# Patient Record
Sex: Female | Born: 1985 | Race: Black or African American | Hispanic: No | Marital: Single | Smoking: Current every day smoker
Health system: Southern US, Community
[De-identification: ages and names within clinical notes are randomized; demographics above are authoritative.]

## PROBLEM LIST (undated history)

## (undated) ENCOUNTER — Inpatient Hospital Stay (HOSPITAL_COMMUNITY): Payer: Self-pay

## (undated) DIAGNOSIS — N83209 Unspecified ovarian cyst, unspecified side: Secondary | ICD-10-CM

## (undated) DIAGNOSIS — B999 Unspecified infectious disease: Secondary | ICD-10-CM

## (undated) DIAGNOSIS — A549 Gonococcal infection, unspecified: Secondary | ICD-10-CM

## (undated) DIAGNOSIS — O1490 Unspecified pre-eclampsia, unspecified trimester: Secondary | ICD-10-CM

## (undated) DIAGNOSIS — A539 Syphilis, unspecified: Secondary | ICD-10-CM

## (undated) DIAGNOSIS — A749 Chlamydial infection, unspecified: Secondary | ICD-10-CM

## (undated) HISTORY — PX: WISDOM TOOTH EXTRACTION: SHX21

---
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
## 2007-04-06 ENCOUNTER — Ambulatory Visit: Payer: Self-pay | Admitting: Gynecology

## 2007-10-12 ENCOUNTER — Ambulatory Visit: Payer: Self-pay | Admitting: *Deleted

## 2007-10-12 ENCOUNTER — Encounter (INDEPENDENT_AMBULATORY_CARE_PROVIDER_SITE_OTHER): Payer: Self-pay | Admitting: *Deleted

## 2010-03-05 ENCOUNTER — Ambulatory Visit: Payer: Self-pay | Admitting: Obstetrics and Gynecology

## 2010-03-05 LAB — CONVERTED CEMR LAB
Platelets: 313 10*3/uL (ref 150–400)
WBC: 12.4 10*3/uL — ABNORMAL HIGH (ref 4.0–10.5)

## 2010-03-08 ENCOUNTER — Ambulatory Visit: Payer: Self-pay | Admitting: Obstetrics and Gynecology

## 2010-03-08 ENCOUNTER — Inpatient Hospital Stay (HOSPITAL_COMMUNITY): Admission: AD | Admit: 2010-03-08 | Discharge: 2010-03-09 | Payer: Self-pay | Admitting: Obstetrics and Gynecology

## 2010-03-09 ENCOUNTER — Inpatient Hospital Stay (HOSPITAL_COMMUNITY): Admission: AD | Admit: 2010-03-09 | Discharge: 2010-03-12 | Payer: Self-pay | Admitting: Obstetrics and Gynecology

## 2010-03-09 ENCOUNTER — Ambulatory Visit: Payer: Self-pay | Admitting: Obstetrics and Gynecology

## 2011-01-12 LAB — CBC
HCT: 39.2 % (ref 36.0–46.0)
Hemoglobin: 13.5 g/dL (ref 12.0–15.0)
Platelets: 289 10*3/uL (ref 150–400)
RBC: 4.28 MIL/uL (ref 3.87–5.11)
WBC: 14.6 10*3/uL — ABNORMAL HIGH (ref 4.0–10.5)

## 2011-01-13 LAB — POCT URINALYSIS DIP (DEVICE)
Ketones, ur: NEGATIVE mg/dL
Specific Gravity, Urine: >=1.03 (ref 1.005–1.030)
pH: 5.5 (ref 5.0–8.0)

## 2011-03-10 NOTE — Group Therapy Note (Signed)
NAME:  Carla Carson, Carla Carson NO.:  [id number]   MEDICAL RECORD NO.:  [id number]          PATIENT TYPE:  WOC   LOCATION:  [facility]                   FACILITY:  [facility]   PHYSICIAN:  Karlton Lemon, MD      DATE OF BIRTH:  [birth year]   DATE OF SERVICE:  10/12/2007                                  CLINIC NOTE   CHIEF COMPLAINT:  Well woman examination.   HISTORY OF PRESENT ILLNESS:  This is a 25 year old gravida 0 presenting  for well-woman examination.  She is currently sexually active and is  taking Loestrin 24 for birth control.  She states she has not had a  period for the past year.  When counseled about possible other birth  control methods patient wishes to continue the Loestrin 24 at this time.  She would also like to be screened for STDs.  She has no other  complaints.  Today she denies any breast masses.   PAST MEDICAL HISTORY:  1. Attention deficit hyperactivity disorder.  2. Borderline diabetes.  3. Hyperlipidemia.   PAST SURGICAL HISTORY:  None.   MEDICATIONS:  Adderall, Seroquel, Trileptal, Loestrin 24.   ALLERGIES:  NO KNOWN DRUG ALLERGIES.   PHYSICAL EXAMINATION:  GENERAL:  This is a well-appearing, overweight  female in no distress.  VITALS:  Temperature 99.8, pulse 112, blood pressure 131/83.  CARDIOVASCULAR:  Heart was regular and no murmurs, rubs or gallops.  RESPIRATORY:  Lungs were clear to auscultation bilaterally.  ABDOMEN:  Soft, nontender to palpation, positive bowel sounds in all 4  quadrants.  GENITOURINARY:  Normal female external genitalia, vaginal mucosa is pink  and moist, there is no discharge noted.  Cervix is midline.  Pap smear  is collected, wet pre was collected.  Uterus is normal size and midline.  There is no cervical motion tenderness.  NECK:  Very difficult to palpate secondary to body habitus.  EXTREMITIES:  No cyanosis, clubbing or edema.   ASSESSMENT/PLAN:  This is a 25 year old gravida 0 presenting for well-  woman  examination that has comorbidities diabetes and hyperlipidemia.  1. Pap smear is performed today.  2. Sexually transmitted disease screening at the patient's request      including RPR, human immunodeficiency virus,      GC/Chlamydia wet prep, hepatitis B and hepatitis C.  3. Patient is to follow up in 12 months for repeat well-woman      examination.           ______________________________  Karlton Lemon, MD     NS/MEDQ  D:  10/12/2007  T:  10/13/2007  Job:  [id number]
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
## 2011-06-06 ENCOUNTER — Emergency Department (HOSPITAL_COMMUNITY)
Admission: EM | Admit: 2011-06-06 | Discharge: 2011-06-06 | Disposition: A | Discharge status: Home / Self Care | Payer: Self-pay | Attending: Emergency Medicine | Admitting: Emergency Medicine

## 2011-06-06 DIAGNOSIS — O2390 Unspecified genitourinary tract infection in pregnancy, unspecified trimester: Secondary | ICD-10-CM | POA: Insufficient documentation

## 2011-06-06 DIAGNOSIS — O98819 Other maternal infectious and parasitic diseases complicating pregnancy, unspecified trimester: Secondary | ICD-10-CM | POA: Insufficient documentation

## 2011-06-06 DIAGNOSIS — A5901 Trichomonal vulvovaginitis: Secondary | ICD-10-CM | POA: Insufficient documentation

## 2011-06-06 DIAGNOSIS — N390 Urinary tract infection, site not specified: Secondary | ICD-10-CM | POA: Insufficient documentation

## 2011-06-06 LAB — URINE MICROSCOPIC-ADD ON

## 2011-06-06 LAB — URINALYSIS, ROUTINE W REFLEX MICROSCOPIC
Glucose, UA: NEGATIVE mg/dL
Hgb urine dipstick: NEGATIVE
Specific Gravity, Urine: 1.022 (ref 1.005–1.030)

## 2011-06-06 LAB — WET PREP, GENITAL: Yeast Wet Prep HPF POC: NONE SEEN

## 2011-06-07 LAB — URINE CULTURE
Colony Count: 85000
Culture  Setup Time: 201208112203

## 2011-06-10 ENCOUNTER — Inpatient Hospital Stay (HOSPITAL_COMMUNITY)
Admission: AD | Admit: 2011-06-10 | Discharge: 2011-06-10 | Disposition: A | Discharge status: Home / Self Care | Payer: Self-pay | Source: Ambulatory Visit | Attending: Obstetrics & Gynecology | Admitting: Obstetrics & Gynecology

## 2011-06-10 ENCOUNTER — Encounter (HOSPITAL_COMMUNITY): Payer: Self-pay

## 2011-06-10 DIAGNOSIS — O98219 Gonorrhea complicating pregnancy, unspecified trimester: Secondary | ICD-10-CM | POA: Insufficient documentation

## 2011-06-10 DIAGNOSIS — A5619 Other chlamydial genitourinary infection: Secondary | ICD-10-CM | POA: Insufficient documentation

## 2011-06-10 DIAGNOSIS — A749 Chlamydial infection, unspecified: Secondary | ICD-10-CM

## 2011-06-10 DIAGNOSIS — O98319 Other infections with a predominantly sexual mode of transmission complicating pregnancy, unspecified trimester: Secondary | ICD-10-CM | POA: Insufficient documentation

## 2011-06-10 DIAGNOSIS — A5400 Gonococcal infection of lower genitourinary tract, unspecified: Secondary | ICD-10-CM | POA: Insufficient documentation

## 2011-06-10 DIAGNOSIS — O98211 Gonorrhea complicating pregnancy, first trimester: Secondary | ICD-10-CM

## 2011-06-10 DIAGNOSIS — O98819 Other maternal infectious and parasitic diseases complicating pregnancy, unspecified trimester: Secondary | ICD-10-CM | POA: Insufficient documentation

## 2011-06-10 DIAGNOSIS — N739 Female pelvic inflammatory disease, unspecified: Secondary | ICD-10-CM | POA: Insufficient documentation

## 2011-06-10 DIAGNOSIS — A568 Sexually transmitted chlamydial infection of other sites: Secondary | ICD-10-CM

## 2011-06-10 LAB — GC/CHLAMYDIA PROBE AMP, GENITAL
Chlamydia, DNA Probe: POSITIVE — AB
GC Probe Amp, Genital: POSITIVE — AB

## 2011-06-10 LAB — URINALYSIS, ROUTINE W REFLEX MICROSCOPIC
Hgb urine dipstick: NEGATIVE
Ketones, ur: 15 mg/dL — AB
Nitrite: NEGATIVE
pH: 6 (ref 5.0–8.0)

## 2011-06-10 LAB — URINE MICROSCOPIC-ADD ON

## 2011-06-10 MED ORDER — AZITHROMYCIN 1 G PO PACK
1.0000 g | PACK | Freq: Once | ORAL | Status: AC
  Administered 2011-06-10: 1 g via ORAL
  Filled 2011-06-10: qty 1

## 2011-06-10 MED ORDER — CEFTRIAXONE SODIUM 250 MG IJ SOLR
250.0000 mg | Freq: Once | INTRAMUSCULAR | Status: AC
  Administered 2011-06-10: 250 mg via INTRAMUSCULAR
  Filled 2011-06-10: qty 250

## 2011-06-10 MED ORDER — METRONIDAZOLE 500 MG PO TABS: 500.0000 mg | ORAL_TABLET | Freq: Two times a day (BID) | ORAL | Status: AC

## 2011-06-10 NOTE — Progress Notes (Signed)
Pt states she was seen in the ER at [facility] on 8-11 and treated for trich and a UTI. Pt states she has shared her pills with her boyfriend. Pt is in MAU today to find out how far she is. Is not having any problems.

## 2011-06-10 NOTE — ED Provider Notes (Addendum)
History   Pt presents today wishing to find out how many weeks pregnant she is. She was seen at [facility] ED and was diagnosed with trich but she has been sharing her medication with her partner. She also had positive cultures for both GC and chlamydia. She has not yet been treated for this. She denies any abd pain, vag dc, bleeding, or any other problems. She states she only needs to know how many weeks she is.  No chief complaint on file.  HPI  OB History    Grav Para Term Preterm Abortions TAB SAB Ect Mult Living   1               No past medical history on file.  No past surgical history on file.  No family history on file.  History  Substance Use Topics  . Smoking status: Not on file  . Smokeless tobacco: Not on file  . Alcohol Use: Not on file    Allergies: Allergies not on file  No prescriptions prior to admission    Review of Systems  Constitutional: Negative for fever and chills.  Cardiovascular: Negative for chest pain.  Gastrointestinal: Negative for nausea, vomiting, abdominal pain, diarrhea and constipation.  Genitourinary: Negative for dysuria, urgency, frequency and hematuria.  Neurological: Negative for dizziness and headaches.  Psychiatric/Behavioral: Negative for depression and suicidal ideas.   Physical Exam   Blood pressure 110/69, pulse 93, temperature 98.8 F (37.1 C), temperature source Oral, resp. rate 20, height 5' 2.5" (1.588 m), weight 264 lb 3.2 oz (119.84 kg), last menstrual period 05/03/2011, SpO2 98.00%.  Physical Exam  Constitutional: She is oriented to person, place, and time. She appears well-developed and well-nourished. No distress.  HENT:  Head: Normocephalic and atraumatic.  GI: Soft. She exhibits no distension. There is no tenderness. There is no rebound and no guarding.  Neurological: She is alert and oriented to person, place, and time.  Skin: Skin is warm and dry. She is not diaphoretic.  Psychiatric: She has a normal mood and  affect. Her behavior is normal. Judgment and thought content normal.    MAU Course  Procedures  Pt treated for GC and chlamydia with Rocephin and azithromycin.   Assessment and Plan  Pregnancy: pt given proof of preg.  Trich: discussed with pt at length. Will retreat since pt has not been taking her medication correctly. Will tx with Flagyl. Warned of antabuse reaction. Advised pt to have partner treated at HD.  GC/Chlamydia: discussed with pt at length. She will f/u with the HD. Discussed safe sex precautions. Discussed diet, activity, risks, and precautions.  Clinton Gallant. Latangela Mccomas III, DrHSc, MPAS, PA-C  06/10/2011, 6:19 PM

## 2011-07-31 LAB — POCT PREGNANCY, URINE
Operator id: 134861
Preg Test, Ur: NEGATIVE

## 2011-08-03 LAB — RUBELLA ANTIBODY, IGM: Rubella: IMMUNE

## 2011-08-03 LAB — RPR: RPR: NONREACTIVE

## 2011-08-03 LAB — HEPATITIS B SURFACE ANTIGEN: Hepatitis B Surface Ag: NEGATIVE

## 2011-08-03 LAB — GC/CHLAMYDIA PROBE AMP, GENITAL: Gonorrhea: NEGATIVE

## 2011-08-03 LAB — CBC: HCT: 37 % (ref 36–46)

## 2011-08-03 LAB — HIV ANTIBODY (ROUTINE TESTING W REFLEX): HIV: NONREACTIVE

## 2011-08-03 LAB — ABO/RH

## 2011-08-17 LAB — CULTURE, OB URINE: Urine Culture, OB: 20000

## 2011-08-17 LAB — CYTOLOGY - PAP: Hgb A1c MFr Bld: 5.7 % (ref 4.0–6.0)

## 2011-08-28 NOTE — ED Provider Notes (Signed)
RN should have populated med hx, meds, and allergies  Agree with above note.  [name] 08/28/2011 2:11 AM

## 2011-10-27 NOTE — L&D Delivery Note (Addendum)
Delivery Note At 4:46 PM a viable female was delivered via Vaginal, Spontaneous Delivery (Presentation: ; Occiput Anterior).  APGAR: 9, 9; weight 7 lb 13 oz (3544 g).   Placenta status: Intact, Spontaneous. Placenta to pathology  Cord: 3 vessels with the following complications: None.  Cord pH: NA  Anesthesia: Epidural  Episiotomy: None Lacerations: 2nd degree;Perineal Suture Repair: 3.0 vicryl Est. Blood Loss (mL):   Mom to postpartum.  Baby to nursery-stable. Mother to breast feed. Nexplanon for birth control. [facility] for pediatric care   Sharmel Ballantine 01/29/2012, 5:08 PM

## 2011-10-27 NOTE — L&D Delivery Note (Signed)
I was present for the delivery and agree with above.  Dorathy Kinsman 01/29/2012 6:19 PM

## 2011-12-15 DIAGNOSIS — Z3490 Encounter for supervision of normal pregnancy, unspecified, unspecified trimester: Secondary | ICD-10-CM

## 2011-12-15 DIAGNOSIS — O0930 Supervision of pregnancy with insufficient antenatal care, unspecified trimester: Secondary | ICD-10-CM | POA: Insufficient documentation

## 2011-12-15 DIAGNOSIS — Z3483 Encounter for supervision of other normal pregnancy, third trimester: Secondary | ICD-10-CM | POA: Insufficient documentation

## 2011-12-16 ENCOUNTER — Ambulatory Visit (INDEPENDENT_AMBULATORY_CARE_PROVIDER_SITE_OTHER): Payer: Medicaid Other | Admitting: Physician Assistant

## 2011-12-16 VITALS — BP 111/68 | Temp 96.8°F | Wt 251.8 lb

## 2011-12-16 DIAGNOSIS — Z3480 Encounter for supervision of other normal pregnancy, unspecified trimester: Secondary | ICD-10-CM

## 2011-12-16 DIAGNOSIS — Z3490 Encounter for supervision of normal pregnancy, unspecified, unspecified trimester: Secondary | ICD-10-CM

## 2011-12-16 LAB — POCT URINALYSIS DIP (DEVICE)
Bilirubin Urine: NEGATIVE
Glucose, UA: NEGATIVE mg/dL
Hgb urine dipstick: NEGATIVE
Ketones, ur: NEGATIVE mg/dL
Specific Gravity, Urine: 1.02 (ref 1.005–1.030)
Urobilinogen, UA: 0.2 mg/dL (ref 0.0–1.0)

## 2011-12-16 MED ORDER — PRENATAL PLUS 27-1 MG PO TABS: 1.0000 | ORAL_TABLET | Freq: Every day | ORAL | Status: DC

## 2011-12-16 MED ORDER — POLYETHYLENE GLYCOL 3350 17 GM/SCOOP PO POWD: 17.0000 g | Freq: Every day | ORAL | Status: AC

## 2011-12-16 NOTE — Progress Notes (Signed)
Subjective:    Carla Carson is a G2P1001 [redacted]w[redacted]d being seen today for her first obstetrical visit.  Her obstetrical history is significant for obesity and smoker. Patient does intend to breast feed. Pregnancy history fully reviewed.  Been receiving prenatal care. All of her records have been transferred. Last seen 4 weeks ago. No complications so far. No high blood pressure, or high blood sugar. Patient has no third trimester labs in the system. Amiri Riechers her dula is present, they have been working together for 2 weeks.  Had GC/Ch diagnosed in early pregnancy. She states she was treated for that.  Taking prenatal vitamins, but ran out. Had constipation for a few weeks. Does report some blood on the TP Patient reports leg swelling, pressure in upper abdomen, difficulty have a bowel movement.  Lives at [facility]. Talks to mother every other day. Patient states she feels safe. Son lives with her mother; states he is safe. FOB is not involved in the pregnancy.   After delivery, she will move in with cousin in [place]. She states she has a carseat, and crib.  Patient is interested in Nexplanon for contraception. She is interested in circ for baby. Will give patient a list of practices that offer circs. [facility] will be seen by [facility].  Filed Vitals:   12/16/11 0829  BP: 111/68  Temp: 96.8 F (36 C)  Weight: 251 lb 12.8 oz (114.216 kg)    HISTORY: OB History    Grav Para Term Preterm Abortions TAB SAB Ect Mult Living   2 1 1  0 0 0 0 0 0 1     # Outc Date GA Lbr Len/2nd Wgt Sex Del Anes PTL Lv   1 TRM 5/11 [redacted]w[redacted]d  6lb5oz(2.863kg) M SVD EPI  Yes   2 CUR              Past Medical History  Diagnosis Date  . No pertinent past medical history    History reviewed. No pertinent past surgical history. History reviewed. No pertinent family history.  Exam    Uterine Size: 33 cm  Pelvic Exam:    Perineum: No Hemorrhoids   Vulva: normal   Vagina:  normal mucosa, curdlike  discharge, wet prep done   pH: Not done   Cervix: no lesions   Adnexa: not evaluated  System:     Skin: normal coloration and turgor, no rashes    Neurologic: oriented, grossly non-focal   Extremities: normal strength, tone, and muscle mass, no edema   HEENT PERRLA   Mouth/Teeth mucous membranes moist, pharynx normal without lesions   Neck supple and no masses   Cardiovascular: regular rate and rhythm, no murmurs or gallops   Respiratory:  appears well, vitals normal, no respiratory distress, acyanotic, normal RR, ear and throat exam is normal, neck free of mass or lymphadenopathy, chest clear, no wheezing, crepitations, rhonchi, normal symmetric air entry   Abdomen: soft, non-tender; bowel sounds normal; no masses,  no organomegaly and gravid   Urinary: urethral meatus normal      Assessment:    Pregnancy: G2P1001 Patient Active Problem List  Diagnoses  . Supervision of normal pregnancy        Plan:     Initial labs drawn. Prenatal vitamins. (Refilled) Problem list reviewed and updated. Genetic Screening discussed Quad Screen: Completed. Ultrasound discussed; fetal survey: Completed.. Follow up in 2 weeks. Constipation: Given Miralax to take daily as needed for constipation.   Marck Mcclenny 12/16/2011

## 2011-12-16 NOTE — Progress Notes (Signed)
Edema-feet. Pain in upper abdomen. Pt states having problems with BM's. Vaginal discharge with odor.

## 2011-12-16 NOTE — Patient Instructions (Signed)
It was nice to meet you today! Everything looks great.  Please return to the clinic in 2 weeks for your next appointment. Please review the information below, and go to the MAU if you have any concerns.  Carla Carson, M.D.  Preterm Labor Preterm labor is when labor starts at less than 37 weeks of pregnancy. The normal length of a pregnancy is 39 to 41 weeks. CAUSES Often, there is no identifiable underlying cause as to why a woman goes into preterm labor. However, one of the most common known causes of preterm labor is infection. Infections of the uterus, cervix, vagina, amniotic sac, bladder, kidney, or even the lungs (pneumonia) can cause labor to start. Other causes of preterm labor include:  Urogenital infections, such as yeast infections and bacterial vaginosis.   Uterine abnormalities (uterine shape, uterine septum, fibroids, bleeding from the placenta).   A cervix that has been operated on and opens prematurely.   Malformations in the baby.   Multiple gestations (twins, triplets, and so on).   Breakage of the amniotic sac.  Additional risk factors for preterm labor include:  Previous history of preterm labor.   Premature rupture of membranes (PROM).   A placenta that covers the opening of the cervix (placenta previa).   A placenta that separates from the uterus (placenta abruption).   A cervix that is too weak to hold the baby in the uterus (incompetence cervix).   Having too much fluid in the amniotic sac (polyhydramnios).   Taking illegal drugs or smoking while pregnant.   Not gaining enough weight while pregnant.   Women younger than 30 and older than 24 years old.   Low socioeconomic status.   African-American ethnicity.  SYMPTOMS Signs and symptoms of preterm labor include:  Menstrual-like cramps.   Contractions that are 30 to 70 seconds apart, become very regular, closer together, and are more intense and painful.   Contractions that start on the  top of the uterus and spread down to the lower abdomen and back.   A sense of increased pelvic pressure or back pain.   A watery or bloody discharge that comes from the vagina.  DIAGNOSIS  A diagnosis can be confirmed by:  A vaginal exam.   An ultrasound of the cervix.   Sampling (swabbing) cervico-vaginal secretions. These samples can be tested for the presence of fetal fibronectin. This is a protein found in cervical discharge which is associated with preterm labor.   Fetal monitoring.  TREATMENT  Depending on the length of the pregnancy and other circumstances, a caregiver may suggest bed rest. If necessary, there are medicines that can be given to stop contractions and to quicken fetal lung maturity. If labor happens before 34 weeks of pregnancy, a prolonged hospital stay may be recommended. Treatment depends on the condition of both the mother and baby. PREVENTION There are some things a mother can do to lower the risk of preterm labor in future pregnancies. A woman can:   Stop smoking.   Maintain healthy weight gain and avoid chemicals and drugs that are not necessary.   Be watchful for any type of infection.   Inform her caregiver if she has a known history of preterm labor.  Document Released: 01/02/2004 Document Revised: 06/24/2011 Document Reviewed: 02/06/2011 [facility] Patient Information 2012 [place], [place].

## 2011-12-17 LAB — CBC
MCH: 29.2 pg (ref 26.0–34.0)
MCHC: 33.3 g/dL (ref 30.0–36.0)
MCV: 87.7 fL (ref 78.0–100.0)
Platelets: 364 10*3/uL (ref 150–400)
RBC: 4.07 MIL/uL (ref 3.87–5.11)

## 2011-12-17 LAB — WET PREP, GENITAL
Trich, Wet Prep: NONE SEEN
Yeast Wet Prep HPF POC: NONE SEEN

## 2011-12-17 LAB — GC/CHLAMYDIA PROBE AMP, GENITAL
Chlamydia, DNA Probe: NEGATIVE
GC Probe Amp, Genital: NEGATIVE

## 2011-12-17 LAB — RPR

## 2011-12-30 ENCOUNTER — Encounter: Payer: Medicaid Other | Admitting: Family Medicine

## 2012-01-03 ENCOUNTER — Inpatient Hospital Stay (HOSPITAL_COMMUNITY)
Admission: AD | Admit: 2012-01-03 | Discharge: 2012-01-03 | Disposition: A | Discharge status: Home Health | Payer: Medicaid Other | Source: Ambulatory Visit | Attending: Obstetrics and Gynecology | Admitting: Obstetrics and Gynecology

## 2012-01-03 ENCOUNTER — Encounter (HOSPITAL_COMMUNITY): Payer: Self-pay | Admitting: *Deleted

## 2012-01-03 DIAGNOSIS — O479 False labor, unspecified: Secondary | ICD-10-CM

## 2012-01-03 DIAGNOSIS — O4700 False labor before 37 completed weeks of gestation, unspecified trimester: Secondary | ICD-10-CM | POA: Insufficient documentation

## 2012-01-03 LAB — URINALYSIS, ROUTINE W REFLEX MICROSCOPIC
Bilirubin Urine: NEGATIVE
Hgb urine dipstick: NEGATIVE
Ketones, ur: NEGATIVE mg/dL
Nitrite: NEGATIVE
Specific Gravity, Urine: 1.015 (ref 1.005–1.030)
Urobilinogen, UA: 0.2 mg/dL (ref 0.0–1.0)
pH: 6.5 (ref 5.0–8.0)

## 2012-01-03 NOTE — Discharge Instructions (Signed)

## 2012-01-03 NOTE — ED Provider Notes (Signed)
Chief Complaint:  contractions  HPI  Carla Carson is  26 y.o. G2P1001 at [redacted]w[redacted]d presents with cramping and vaginal pressure every 5 minutes since early this afternoon.  She reports good fetal movement and denies LOF, vaginal bleeding, vaginal itching/burning, urinary symptoms, dizziness, h/a, n/v, or fever/chills.  She reports having had little water to drink today.   Obstetrical/Gynecological History: OB History    Grav Para Term Preterm Abortions TAB SAB Ect Mult Living   2 1 1  0 0 0 0 0 0 1      Past Medical History: Past Medical History  Diagnosis Date  . No pertinent past medical history     Past Surgical History: History reviewed. No pertinent past surgical history.  Family History: History reviewed. No pertinent family history.  Social History: History  Substance Use Topics  . Smoking status: Former Smoker -- 1.0 packs/day    Quit date: 12/09/2011  . Smokeless tobacco: Former Neurosurgeon    Quit date: 12/09/2011  . Alcohol Use: No    Allergies: No Known Allergies  Meds:  Prescriptions prior to admission  Medication Sig Dispense Refill  . acetaminophen (TYLENOL) 500 MG tablet Take 500 mg by mouth every 6 (six) hours as needed. Patient used medication for pain.       Marland Kitchen DISCONTD: Prenatal Vit-Fe Fumarate-FA (PRENATAL MULTIVITAMIN) TABS Take 1 tablet by mouth at bedtime.      . prenatal vitamin w/FE, FA (PRENATAL 1 + 1) 27-1 MG TABS Take 1 tablet by mouth daily.  30 each  11    Review of Systems See HPI for pertinent HPI  Physical Exam  Blood pressure 110/65, pulse 90, temperature 98.5 F (36.9 C), temperature source Oral, resp. rate 18, height 5\' 3"  (1.6 m), weight 112.492 kg (248 lb), last menstrual period 05/03/2011, unknown if currently breastfeeding. GENERAL: Well-developed, well-nourished female in no acute distress.  LUNGS: Clear to auscultation bilaterally.  HEART: Regular rate and rhythm. ABDOMEN: Soft, nontender, nondistended, gravid.  EXTREMITIES:  Nontender, no edema, 2+ distal pulses. Cervical Exam: 0/long/high Presentation: Not determined FHT:  Baseline rate 135 bpm   Variability moderate  Accelerations present   Decelerations none Contractions: 4 in 45 minutes, irregular   Labs: Recent Results (from the past 24 hour(s))  URINALYSIS, ROUTINE W REFLEX MICROSCOPIC   Collection Time   01/03/12  3:30 PM      Component Value Range   Color, Urine YELLOW  YELLOW    APPearance CLEAR  CLEAR    Specific Gravity, Urine 1.015  1.005 - 1.030    pH 6.5  5.0 - 8.0    Glucose, UA NEGATIVE  NEGATIVE (mg/dL)   Hgb urine dipstick NEGATIVE  NEGATIVE    Bilirubin Urine NEGATIVE  NEGATIVE    Ketones, ur NEGATIVE  NEGATIVE (mg/dL)   Protein, ur NEGATIVE  NEGATIVE (mg/dL)   Urobilinogen, UA 0.2  0.0 - 1.0 (mg/dL)   Nitrite NEGATIVE  NEGATIVE    Leukocytes, UA NEGATIVE  NEGATIVE    Imaging Studies:  Not indicated  Assessment/Plan: A: Braxton-hicks contractions  P: D/C home with PTL precautions Encouraged increased PO fluids F/U with prenatal provider this week Return to MAU as needed      LEFTWICH-KIRBY, Destyn Schuyler 3/10/20134:30 PM

## 2012-01-03 NOTE — Progress Notes (Signed)
Pt c/o low abd cramping after church today.  Denies any bleeding,  diarrhea or difficulty urinating.

## 2012-01-03 NOTE — ED Provider Notes (Signed)
Attestation of Attending Supervision of Advanced Practitioner: Evaluation and management procedures were performed by the PA/NP/CNM/OB Fellow under my supervision/collaboration. Chart reviewed and agree with management and plan.  Tilda Burrow 01/03/2012 6:35 PM

## 2012-01-06 ENCOUNTER — Ambulatory Visit (INDEPENDENT_AMBULATORY_CARE_PROVIDER_SITE_OTHER): Payer: Medicaid Other | Admitting: Family Medicine

## 2012-01-06 ENCOUNTER — Encounter: Payer: Self-pay | Admitting: *Deleted

## 2012-01-06 ENCOUNTER — Encounter: Payer: Self-pay | Admitting: Family Medicine

## 2012-01-06 VITALS — BP 116/68 | Temp 97.5°F | Wt 257.9 lb

## 2012-01-06 DIAGNOSIS — Z3490 Encounter for supervision of normal pregnancy, unspecified, unspecified trimester: Secondary | ICD-10-CM

## 2012-01-06 DIAGNOSIS — O0930 Supervision of pregnancy with insufficient antenatal care, unspecified trimester: Secondary | ICD-10-CM

## 2012-01-06 MED ORDER — METRONIDAZOLE 500 MG PO TABS: 500.0000 mg | ORAL_TABLET | Freq: Three times a day (TID) | ORAL | Status: AC

## 2012-01-06 NOTE — Patient Instructions (Signed)
Pregnancy - Third Trimester The third trimester of pregnancy (the last 3 months) is a period of the most rapid growth for you and your baby. The baby approaches a length of 20 inches and a weight of 6 to 10 pounds. The baby is adding on fat and getting ready for life outside your body. While inside, babies have periods of sleeping and waking, suck their thumbs, and hiccups. You can often feel small contractions of the uterus. This is false labor. It is also called Braxton-Hicks contractions. This is like a practice for labor. The usual problems in this stage of pregnancy include more difficulty breathing, swelling of the hands and feet from water retention, and having to urinate more often because of the uterus and baby pressing on your bladder.  PRENATAL EXAMS  Blood work may continue to be done during prenatal exams. These tests are done to check on your health and the probable health of your baby. Blood work is used to follow your blood levels (hemoglobin). Anemia (low hemoglobin) is common during pregnancy. Iron and vitamins are given to help prevent this. You may also continue to be checked for diabetes. Some of the past blood tests may be done again.   The size of the uterus is measured during each visit. This makes sure your baby is growing properly according to your pregnancy dates.   Your blood pressure is checked every prenatal visit. This is to make sure you are not getting toxemia.   Your urine is checked every prenatal visit for infection, diabetes and protein.   Your weight is checked at each visit. This is done to make sure gains are happening at the suggested rate and that you and your baby are growing normally.   Sometimes, an ultrasound is performed to confirm the position and the proper growth and development of the baby. This is a test done that bounces harmless sound waves off the baby so your caregiver can more accurately determine due dates.   Discuss the type of pain  medication and anesthesia you will have during your labor and delivery.   Discuss the possibility and anesthesia if a Cesarean Section might be necessary.   Inform your caregiver if there is any mental or physical violence at home.  Sometimes, a specialized non-stress test, contraction stress test and biophysical profile are done to make sure the baby is not having a problem. Checking the amniotic fluid surrounding the baby is called an amniocentesis. The amniotic fluid is removed by sticking a needle into the belly (abdomen). This is sometimes done near the end of pregnancy if an early delivery is required. In this case, it is done to help make sure the baby's lungs are mature enough for the baby to live outside of the womb. If the lungs are not mature and it is unsafe to deliver the baby, an injection of cortisone medication is given to the mother 1 to 2 days before the delivery. This helps the baby's lungs mature and makes it safer to deliver the baby. CHANGES OCCURING IN THE THIRD TRIMESTER OF PREGNANCY Your body goes through many changes during pregnancy. They vary from person to person. Talk to your caregiver about changes you notice and are concerned about.  During the last trimester, you have probably had an increase in your appetite. It is normal to have cravings for certain foods. This varies from person to person and pregnancy to pregnancy.   You may begin to get stretch marks on your hips,   abdomen, and breasts. These are normal changes in the body during pregnancy. There are no exercises or medications to take which prevent this change.   Constipation may be treated with a stool softener or adding bulk to your diet. Drinking lots of fluids, fiber in vegetables, fruits, and whole grains are helpful.   Exercising is also helpful. If you have been very active up until your pregnancy, most of these activities can be continued during your pregnancy. If you have been less active, it is helpful  to start an exercise program such as walking. Consult your caregiver before starting exercise programs.   Avoid all smoking, alcohol, un-prescribed drugs, herbs and "street drugs" during your pregnancy. These chemicals affect the formation and growth of the baby. Avoid chemicals throughout the pregnancy to ensure the delivery of a healthy infant.   Backache, varicose veins and hemorrhoids may develop or get worse.   You will tire more easily in the third trimester, which is normal.   The baby's movements may be stronger and more often.   You may become short of breath easily.   Your belly button may stick out.   A yellow discharge may leak from your breasts called colostrum.   You may have a bloody mucus discharge. This usually occurs a few days to a week before labor begins.  HOME CARE INSTRUCTIONS   Keep your caregiver's appointments. Follow your caregiver's instructions regarding medication use, exercise, and diet.   During pregnancy, you are providing food for you and your baby. Continue to eat regular, well-balanced meals. Choose foods such as meat, fish, milk and other low fat dairy products, vegetables, fruits, and whole-grain breads and cereals. Your caregiver will tell you of the ideal weight gain.   A physical sexual relationship may be continued throughout pregnancy if there are no other problems such as early (premature) leaking of amniotic fluid from the membranes, vaginal bleeding, or belly (abdominal) pain.   Exercise regularly if there are no restrictions. Check with your caregiver if you are unsure of the safety of your exercises. Greater weight gain will occur in the last 2 trimesters of pregnancy. Exercising helps:   Control your weight.   Get you in shape for labor and delivery.   You lose weight after you deliver.   Rest a lot with legs elevated, or as needed for leg cramps or low back pain.   Wear a good support or jogging bra for breast tenderness during  pregnancy. This may help if worn during sleep. Pads or tissues may be used in the bra if you are leaking colostrum.   Do not use hot tubs, steam rooms, or saunas.   Wear your seat belt when driving. This protects you and your baby if you are in an accident.   Avoid raw meat, cat litter boxes and soil used by cats. These carry germs that can cause birth defects in the baby.   It is easier to loose urine during pregnancy. Tightening up and strengthening the pelvic muscles will help with this problem. You can practice stopping your urination while you are going to the bathroom. These are the same muscles you need to strengthen. It is also the muscles you would use if you were trying to stop from passing gas. You can practice tightening these muscles up 10 times a set and repeating this about 3 times per day. Once you know what muscles to tighten up, do not perform these exercises during urination. It is more likely   to cause an infection by backing up the urine.   Ask for help if you have financial, counseling or nutritional needs during pregnancy. Your caregiver will be able to offer counseling for these needs as well as refer you for other special needs.   Make a list of emergency phone numbers and have them available.   Plan on getting help from family or friends when you go home from the hospital.   Make a trial run to the hospital.   Take prenatal classes with the father to understand, practice and ask questions about the labor and delivery.   Prepare the baby's room/nursery.   Do not travel out of the city unless it is absolutely necessary and with the advice of your caregiver.   Wear only low or no heal shoes to have better balance and prevent falling.  MEDICATIONS AND DRUG USE IN PREGNANCY  Take prenatal vitamins as directed. The vitamin should contain 1 milligram of folic acid. Keep all vitamins out of reach of children. Only a couple vitamins or tablets containing iron may be fatal  to a baby or young child when ingested.   Avoid use of all medications, including herbs, over-the-counter medications, not prescribed or suggested by your caregiver. Only take over-the-counter or prescription medicines for pain, discomfort, or fever as directed by your caregiver. Do not use aspirin, ibuprofen (Motrin, Advil, Nuprin) or naproxen (Aleve) unless OK'd by your caregiver.   Let your caregiver also know about herbs you may be using.   Alcohol is related to a number of birth defects. This includes fetal alcohol syndrome. All alcohol, in any form, should be avoided completely. Smoking will cause low birth rate and premature babies.   Street/illegal drugs are very harmful to the baby. They are absolutely forbidden. A baby born to an addicted mother will be addicted at birth. The baby will go through the same withdrawal an adult does.  SEEK MEDICAL CARE IF: You have any concerns or worries during your pregnancy. It is better to call with your questions if you feel they cannot wait, rather than worry about them. DECISIONS ABOUT CIRCUMCISION You may or may not know the sex of your baby. If you know your baby is a boy, it may be time to think about circumcision. Circumcision is the removal of the foreskin of the penis. This is the skin that covers the sensitive end of the penis. There is no proven medical need for this. Often this decision is made on what is popular at the time or based upon religious beliefs and social issues. You can discuss these issues with your caregiver or pediatrician. SEEK IMMEDIATE MEDICAL CARE IF:   An unexplained oral temperature above 102 F (38.9 C) develops, or as your caregiver suggests.   You have leaking of fluid from the vagina (birth canal). If leaking membranes are suspected, take your temperature and tell your caregiver of this when you call.   There is vaginal spotting, bleeding or passing clots. Tell your caregiver of the amount and how many pads are  used.   You develop a bad smelling vaginal discharge with a change in the color from clear to white.   You develop vomiting that lasts more than 24 hours.   You develop chills or fever.   You develop shortness of breath.   You develop burning on urination.   You loose more than 2 pounds of weight or gain more than 2 pounds of weight or as suggested by your   caregiver.   You notice sudden swelling of your face, hands, and feet or legs.   You develop belly (abdominal) pain. Round ligament discomfort is a common non-cancerous (benign) cause of abdominal pain in pregnancy. Your caregiver still must evaluate you.   You develop a severe headache that does not go away.   You develop visual problems, blurred or double vision.   If you have not felt your baby move for more than 1 hour. If you think the baby is not moving as much as usual, eat something with sugar in it and lie down on your left side for an hour. The baby should move at least 4 to 5 times per hour. Call right away if your baby moves less than that.   You fall, are in a car accident or any kind of trauma.   There is mental or physical violence at home.  Document Released: 10/06/2001 Document Revised: 10/01/2011 Document Reviewed: 04/10/2009 [employer] Patient Information 2012 [employer]. 

## 2012-01-06 NOTE — Progress Notes (Signed)
Pulse: 88

## 2012-01-06 NOTE — Progress Notes (Signed)
Patient without complaints.  Denies vaginal bleeding, abnormal vaginal discharge, contractions, loss of fluid.  Reports good fetal activity.  Follow up in 1 weeks.  

## 2012-01-09 LAB — STREP B DNA PROBE: GBS: NEGATIVE

## 2012-01-13 ENCOUNTER — Ambulatory Visit (INDEPENDENT_AMBULATORY_CARE_PROVIDER_SITE_OTHER): Payer: Medicaid Other | Admitting: Advanced Practice Midwife

## 2012-01-13 VITALS — BP 111/73 | Temp 97.6°F | Wt 263.6 lb

## 2012-01-13 DIAGNOSIS — Z3480 Encounter for supervision of other normal pregnancy, unspecified trimester: Secondary | ICD-10-CM

## 2012-01-13 DIAGNOSIS — O0930 Supervision of pregnancy with insufficient antenatal care, unspecified trimester: Secondary | ICD-10-CM

## 2012-01-13 LAB — POCT URINALYSIS DIP (DEVICE)
Hgb urine dipstick: NEGATIVE
Nitrite: NEGATIVE
Urobilinogen, UA: 0.2 mg/dL (ref 0.0–1.0)
pH: 7 (ref 5.0–8.0)

## 2012-01-13 NOTE — Progress Notes (Signed)
Patient doing well. Irregular braxton hicks contractions but no LOF, bleeding or vaginal discharge. Good fetal movement. Patient completed Flagyl for BV. Taking prenatal vitamin.  Living at [facility]. In good spirits today. (Completed paper work for medical information for [facility] for patient) Will return in one week, or go to [facility] if she has any signs of labor.

## 2012-01-13 NOTE — Progress Notes (Signed)
Pulse- 98 

## 2012-01-13 NOTE — Patient Instructions (Signed)
Normal Labor and Delivery Your caregiver must first be sure you are in labor. Signs of labor include:  You may pass what is called "the mucus plug" before labor begins. This is a small amount of blood stained mucus.   Regular uterine contractions.   The time between contractions get closer together.   The discomfort and pain gradually gets more intense.   Pains are mostly located in the back.   Pains get worse when walking.   The cervix (the opening of the uterus becomes thinner (begins to efface) and opens up (dilates).  Once you are in labor and admitted into the hospital or care center, your caregiver will do the following:  A complete physical examination.   Check your vital signs (blood pressure, pulse, temperature and the fetal heart rate).   Do a vaginal examination (using a sterile glove and lubricant) to determine:   The position (presentation) of the baby (head [vertex] or buttock first).   The level (station) of the baby's head in the birth canal.   The effacement and dilatation of the cervix.   You may have your pubic hair shaved and be given an enema depending on your caregiver and the circumstance.   An electronic monitor is usually placed on your abdomen. The monitor follows the length and intensity of the contractions, as well as the baby's heart rate.   Usually, your caregiver will insert an IV in your arm with a bottle of sugar water. This is done as a precaution so that medications can be given to you quickly during labor or delivery.  NORMAL LABOR AND DELIVERY IS DIVIDED UP INTO 3 STAGES: First Stage This is when regular contractions begin and the cervix begins to efface and dilate. This stage can last from 3 to 15 hours. The end of the first stage is when the cervix is 100% effaced and 10 centimeters dilated. Pain medications may be given by   Injection (morphine, demerol, etc.)   Regional anesthesia (spinal, caudal or epidural, anesthetics given in  different locations of the spine). Paracervical pain medication may be given, which is an injection of and anesthetic on each side of the cervix.  A pregnant woman may request to have "Natural Childbirth" which is not to have any medications or anesthesia during her labor and delivery. Second Stage This is when the baby comes down through the birth canal (vagina) and is born. This can take 1 to 4 hours. As the baby's head comes down through the birth canal, you may feel like you are going to have a bowel movement. You will get the urge to bear down and push until the baby is delivered. As the baby's head is being delivered, the caregiver will decide if an episiotomy (a cut in the perineum and vagina area) is needed to prevent tearing of the tissue in this area. The episiotomy is sewn up after the delivery of the baby and placenta. Sometimes a mask with nitrous oxide is given for the mother to breath during the delivery of the baby to help if there is too much pain. The end of Stage 2 is when the baby is fully delivered. Then when the umbilical cord stops pulsating it is clamped and cut. Third Stage The third stage begins after the baby is completely delivered and ends after the placenta (afterbirth) is delivered. This usually takes 5 to 30 minutes. After the placenta is delivered, a medication is given either by intravenous or injection to help contract   the uterus and prevent bleeding. The third stage is not painful and pain medication is usually not necessary. If an episiotomy was done, it is repaired at this time. After the delivery, the mother is watched and monitored closely for 1 to 2 hours to make sure there is no postpartum bleeding (hemorrhage). If there is a lot of bleeding, medication is given to contract the uterus and stop the bleeding. Document Released: 07/21/2008 Document Revised: 10/01/2011 Document Reviewed: 07/21/2008 [employer] Patient Information 2012 [employer]. 

## 2012-01-15 ENCOUNTER — Encounter: Payer: Self-pay | Admitting: *Deleted

## 2012-01-20 ENCOUNTER — Ambulatory Visit (INDEPENDENT_AMBULATORY_CARE_PROVIDER_SITE_OTHER): Payer: Medicaid Other | Admitting: Obstetrics and Gynecology

## 2012-01-20 VITALS — BP 112/78 | Temp 97.6°F | Wt 261.1 lb

## 2012-01-20 DIAGNOSIS — O0930 Supervision of pregnancy with insufficient antenatal care, unspecified trimester: Secondary | ICD-10-CM

## 2012-01-20 LAB — POCT URINALYSIS DIP (DEVICE)
Nitrite: NEGATIVE
Protein, ur: NEGATIVE mg/dL
pH: 7.5 (ref 5.0–8.0)

## 2012-01-20 NOTE — Progress Notes (Signed)
Agree with resident note.

## 2012-01-20 NOTE — Patient Instructions (Signed)

## 2012-01-20 NOTE — Progress Notes (Signed)
Doing well with no concerns.  Good fetal movement.  No vaginal bleeding, discharge or LOF.  + irregular contractions, + pelvic pressure (feels like head is against cervix).  Reviewed labor precautions and kick counts.  Return to clinic in 1 week.  Will need to repeat GBS if goes beyond 39 weeks.

## 2012-01-20 NOTE — Progress Notes (Signed)
Edema- feet.  Pain/pressure- pelvic.  Pulse- 92

## 2012-01-27 ENCOUNTER — Ambulatory Visit (INDEPENDENT_AMBULATORY_CARE_PROVIDER_SITE_OTHER): Payer: Medicaid Other | Admitting: Physician Assistant

## 2012-01-27 VITALS — BP 120/75 | Temp 97.0°F | Wt 262.6 lb

## 2012-01-27 DIAGNOSIS — O0930 Supervision of pregnancy with insufficient antenatal care, unspecified trimester: Secondary | ICD-10-CM

## 2012-01-27 DIAGNOSIS — N898 Other specified noninflammatory disorders of vagina: Secondary | ICD-10-CM

## 2012-01-27 LAB — POCT URINALYSIS DIP (DEVICE)
Leukocytes, UA: NEGATIVE
Protein, ur: NEGATIVE mg/dL
Specific Gravity, Urine: 1.02 (ref 1.005–1.030)
Urobilinogen, UA: 0.2 mg/dL (ref 0.0–1.0)

## 2012-01-27 NOTE — Patient Instructions (Signed)
The wetness you are feeling is most likely just mucus. There was no evidence that you ruptured your membranes.  You are 3 cm dilated and we swept your membranes to help labor progress along. Things to watch out for: - if contractions happen every 2-57minutes for 2hrs.  - if the contractions keep you from talking and walking - if you feel a gush of fluid running down your leg Come to the hospital. Normal Labor and Delivery Your caregiver must first be sure you are in labor. Signs of labor include:  You may pass what is called "the mucus plug" before labor begins. This is a small amount of blood stained mucus.   Regular uterine contractions.   The time between contractions get closer together.   The discomfort and pain gradually gets more intense.   Pains are mostly located in the back.   Pains get worse when walking.   The cervix (the opening of the uterus becomes thinner (begins to efface) and opens up (dilates).  Once you are in labor and admitted into the hospital or care center, your caregiver will do the following:  A complete physical examination.   Check your vital signs (blood pressure, pulse, temperature and the fetal heart rate).   Do a vaginal examination (using a sterile glove and lubricant) to determine:   The position (presentation) of the baby (head [vertex] or buttock first).   The level (station) of the baby's head in the birth canal.   The effacement and dilatation of the cervix.   You may have your pubic hair shaved and be given an enema depending on your caregiver and the circumstance.   An electronic monitor is usually placed on your abdomen. The monitor follows the length and intensity of the contractions, as well as the baby's heart rate.   Usually, your caregiver will insert an IV in your arm with a bottle of sugar water. This is done as a precaution so that medications can be given to you quickly during labor or delivery.  NORMAL LABOR AND DELIVERY IS  DIVIDED UP INTO 3 STAGES: First Stage This is when regular contractions begin and the cervix begins to efface and dilate. This stage can last from 3 to 15 hours. The end of the first stage is when the cervix is 100% effaced and 10 centimeters dilated. Pain medications may be given by   Injection (morphine, demerol, etc.)   Regional anesthesia (spinal, caudal or epidural, anesthetics given in different locations of the spine). Paracervical pain medication may be given, which is an injection of and anesthetic on each side of the cervix.  A pregnant woman may request to have "Natural Childbirth" which is not to have any medications or anesthesia during her labor and delivery. Second Stage This is when the baby comes down through the birth canal (vagina) and is born. This can take 1 to 4 hours. As the baby's head comes down through the birth canal, you may feel like you are going to have a bowel movement. You will get the urge to bear down and push until the baby is delivered. As the baby's head is being delivered, the caregiver will decide if an episiotomy (a cut in the perineum and vagina area) is needed to prevent tearing of the tissue in this area. The episiotomy is sewn up after the delivery of the baby and placenta. Sometimes a mask with nitrous oxide is given for the mother to breath during the delivery of the baby to  help if there is too much pain. The end of Stage 2 is when the baby is fully delivered. Then when the umbilical cord stops pulsating it is clamped and cut. Third Stage The third stage begins after the baby is completely delivered and ends after the placenta (afterbirth) is delivered. This usually takes 5 to 30 minutes. After the placenta is delivered, a medication is given either by intravenous or injection to help contract the uterus and prevent bleeding. The third stage is not painful and pain medication is usually not necessary. If an episiotomy was done, it is repaired at this  time. After the delivery, the mother is watched and monitored closely for 1 to 2 hours to make sure there is no postpartum bleeding (hemorrhage). If there is a lot of bleeding, medication is given to contract the uterus and stop the bleeding. Document Released: 07/21/2008 Document Revised: 10/01/2011 Document Reviewed: 07/21/2008 [facility] Patient Information 2012 [place], [place].

## 2012-01-27 NOTE — Progress Notes (Signed)
P=95 , c/o edema in feet only, c/o watery discharge that started 02/23/12- states notices it all day long, c/o pants wet, c/o ear hurts too,

## 2012-01-27 NOTE — Progress Notes (Signed)
26 yo G2P1001 at 38.3wga who presents for her routine prenatal visit. Complains of leaking of fluid since Friday of last week. No big gush of fluid but just feeling wet. No vaginal bleeding. Has been feeling contractions every 10 minutes or so since Saturday. Is able to talk through contractions. Contractions have diminished in intensity since Sunday. Feeling the baby move.  PE:  Speculum exam: no pooling, mucus present.  Dil: 3cm, 50%, -2 station A/P:  - no evidence of ferning on micro slide. Most likely mucus.  - swept membranes and went over labor precautions  - GBS negative

## 2012-01-28 LAB — WET PREP, GENITAL

## 2012-01-29 ENCOUNTER — Encounter (HOSPITAL_COMMUNITY): Payer: Self-pay | Admitting: *Deleted

## 2012-01-29 ENCOUNTER — Encounter (HOSPITAL_COMMUNITY): Payer: Self-pay

## 2012-01-29 ENCOUNTER — Inpatient Hospital Stay (HOSPITAL_COMMUNITY)
Admission: AD | Admit: 2012-01-29 | Discharge: 2012-01-29 | Disposition: A | Discharge status: Home / Self Care | Payer: Medicaid Other | Attending: Obstetrics & Gynecology | Admitting: Obstetrics & Gynecology

## 2012-01-29 ENCOUNTER — Inpatient Hospital Stay (HOSPITAL_COMMUNITY): Payer: Medicaid Other | Admitting: Anesthesiology

## 2012-01-29 ENCOUNTER — Inpatient Hospital Stay (HOSPITAL_COMMUNITY): Payer: Medicaid Other

## 2012-01-29 ENCOUNTER — Encounter (HOSPITAL_COMMUNITY): Payer: Self-pay | Admitting: Anesthesiology

## 2012-01-29 ENCOUNTER — Inpatient Hospital Stay (HOSPITAL_COMMUNITY)
Admission: AD | Admit: 2012-01-29 | Discharge: 2012-01-31 | DRG: 775 | Disposition: A | Discharge status: Home / Self Care | Payer: Medicaid Other | Source: Ambulatory Visit | Attending: Obstetrics & Gynecology | Admitting: Obstetrics & Gynecology

## 2012-01-29 DIAGNOSIS — O479 False labor, unspecified: Secondary | ICD-10-CM | POA: Insufficient documentation

## 2012-01-29 DIAGNOSIS — O0930 Supervision of pregnancy with insufficient antenatal care, unspecified trimester: Secondary | ICD-10-CM

## 2012-01-29 LAB — CBC
HCT: 39.1 % (ref 36.0–46.0)
Hemoglobin: 13.3 g/dL (ref 12.0–15.0)
MCH: 30 pg (ref 26.0–34.0)
MCHC: 34 g/dL (ref 30.0–36.0)

## 2012-01-29 MED ORDER — ONDANSETRON HCL 4 MG/2ML IJ SOLN: 4.0000 mg | INTRAMUSCULAR | Status: DC | PRN

## 2012-01-29 MED ORDER — IBUPROFEN 600 MG PO TABS
600.0000 mg | ORAL_TABLET | Freq: Four times a day (QID) | ORAL | Status: DC
  Administered 2012-01-29 – 2012-01-31 (×6): 600 mg via ORAL
  Filled 2012-01-29 (×6): qty 1

## 2012-01-29 MED ORDER — BENZOCAINE-MENTHOL 20-0.5 % EX AERO: 1.0000 "application " | INHALATION_SPRAY | CUTANEOUS | Status: DC | PRN

## 2012-01-29 MED ORDER — PRENATAL MULTIVITAMIN CH
1.0000 | ORAL_TABLET | Freq: Every day | ORAL | Status: DC
  Administered 2012-01-30 – 2012-01-31 (×2): 1 via ORAL
  Filled 2012-01-29 (×2): qty 1

## 2012-01-29 MED ORDER — FENTANYL 2.5 MCG/ML BUPIVACAINE 1/10 % EPIDURAL INFUSION (WH - ANES)
14.0000 mL/h | INTRAMUSCULAR | Status: DC
  Filled 2012-01-29: qty 60

## 2012-01-29 MED ORDER — LACTATED RINGERS IV SOLN: 500.0000 mL | INTRAVENOUS | Status: DC | PRN

## 2012-01-29 MED ORDER — TETANUS-DIPHTH-ACELL PERTUSSIS 5-2.5-18.5 LF-MCG/0.5 IM SUSP
0.5000 mL | Freq: Once | INTRAMUSCULAR | Status: AC
  Administered 2012-01-31: 0.5 mL via INTRAMUSCULAR
  Filled 2012-01-29: qty 0.5

## 2012-01-29 MED ORDER — EPHEDRINE 5 MG/ML INJ: 10.0000 mg | INTRAVENOUS | Status: DC | PRN

## 2012-01-29 MED ORDER — LIDOCAINE HCL (PF) 1 % IJ SOLN: 30.0000 mL | INTRAMUSCULAR | Status: DC | PRN

## 2012-01-29 MED ORDER — DIPHENHYDRAMINE HCL 50 MG/ML IJ SOLN: 12.5000 mg | INTRAMUSCULAR | Status: DC | PRN

## 2012-01-29 MED ORDER — HYDROXYZINE HCL 50 MG PO TABS: 50.0000 mg | ORAL_TABLET | Freq: Four times a day (QID) | ORAL | Status: DC | PRN

## 2012-01-29 MED ORDER — OXYCODONE-ACETAMINOPHEN 5-325 MG PO TABS: 1.0000 | ORAL_TABLET | ORAL | Status: DC | PRN

## 2012-01-29 MED ORDER — ONDANSETRON HCL 4 MG/2ML IJ SOLN: 4.0000 mg | Freq: Four times a day (QID) | INTRAMUSCULAR | Status: DC | PRN

## 2012-01-29 MED ORDER — ZOLPIDEM TARTRATE 5 MG PO TABS: 5.0000 mg | ORAL_TABLET | Freq: Every evening | ORAL | Status: DC | PRN

## 2012-01-29 MED ORDER — ONDANSETRON HCL 4 MG PO TABS: 4.0000 mg | ORAL_TABLET | ORAL | Status: DC | PRN

## 2012-01-29 MED ORDER — PHENYLEPHRINE 40 MCG/ML (10ML) SYRINGE FOR IV PUSH (FOR BLOOD PRESSURE SUPPORT)
80.0000 ug | PREFILLED_SYRINGE | INTRAVENOUS | Status: DC | PRN
  Filled 2012-01-29: qty 5

## 2012-01-29 MED ORDER — HYDROXYZINE HCL 50 MG/ML IM SOLN: 50.0000 mg | Freq: Four times a day (QID) | INTRAMUSCULAR | Status: DC | PRN

## 2012-01-29 MED ORDER — ZOLPIDEM TARTRATE 10 MG PO TABS
10.0000 mg | ORAL_TABLET | Freq: Once | ORAL | Status: AC
  Administered 2012-01-29: 10 mg via ORAL
  Filled 2012-01-29: qty 1

## 2012-01-29 MED ORDER — SIMETHICONE 80 MG PO CHEW: 80.0000 mg | CHEWABLE_TABLET | ORAL | Status: DC | PRN

## 2012-01-29 MED ORDER — EPHEDRINE 5 MG/ML INJ
10.0000 mg | INTRAVENOUS | Status: DC | PRN
  Filled 2012-01-29: qty 4

## 2012-01-29 MED ORDER — WITCH HAZEL-GLYCERIN EX PADS: 1.0000 "application " | MEDICATED_PAD | CUTANEOUS | Status: DC | PRN

## 2012-01-29 MED ORDER — LANOLIN HYDROUS EX OINT: TOPICAL_OINTMENT | CUTANEOUS | Status: DC | PRN

## 2012-01-29 MED ORDER — IBUPROFEN 600 MG PO TABS: 600.0000 mg | ORAL_TABLET | Freq: Four times a day (QID) | ORAL | Status: DC | PRN

## 2012-01-29 MED ORDER — ACETAMINOPHEN 325 MG PO TABS: 650.0000 mg | ORAL_TABLET | ORAL | Status: DC | PRN

## 2012-01-29 MED ORDER — SENNOSIDES-DOCUSATE SODIUM 8.6-50 MG PO TABS
2.0000 | ORAL_TABLET | Freq: Every day | ORAL | Status: DC
  Administered 2012-01-29 – 2012-01-30 (×2): 2 via ORAL

## 2012-01-29 MED ORDER — DIBUCAINE 1 % RE OINT: 1.0000 "application " | TOPICAL_OINTMENT | RECTAL | Status: DC | PRN

## 2012-01-29 MED ORDER — LACTATED RINGERS IV SOLN
INTRAVENOUS | Status: DC
  Administered 2012-01-29 (×2): via INTRAVENOUS

## 2012-01-29 MED ORDER — DIPHENHYDRAMINE HCL 25 MG PO CAPS: 25.0000 mg | ORAL_CAPSULE | Freq: Four times a day (QID) | ORAL | Status: DC | PRN

## 2012-01-29 MED ORDER — LACTATED RINGERS IV SOLN: 500.0000 mL | Freq: Once | INTRAVENOUS | Status: DC

## 2012-01-29 MED ORDER — OXYTOCIN BOLUS FROM INFUSION
500.0000 mL | Freq: Once | INTRAVENOUS | Status: DC
  Filled 2012-01-29: qty 500

## 2012-01-29 MED ORDER — FENTANYL 2.5 MCG/ML BUPIVACAINE 1/10 % EPIDURAL INFUSION (WH - ANES)
INTRAMUSCULAR | Status: DC | PRN
  Administered 2012-01-29: 14 mL/h via EPIDURAL

## 2012-01-29 MED ORDER — CITRIC ACID-SODIUM CITRATE 334-500 MG/5ML PO SOLN: 30.0000 mL | ORAL | Status: DC | PRN

## 2012-01-29 MED ORDER — OXYTOCIN 20 UNITS IN LACTATED RINGERS INFUSION - SIMPLE
125.0000 mL/h | Freq: Once | INTRAVENOUS | Status: DC
  Filled 2012-01-29: qty 1000

## 2012-01-29 MED ORDER — FLEET ENEMA 7-19 GM/118ML RE ENEM: 1.0000 | ENEMA | RECTAL | Status: DC | PRN

## 2012-01-29 MED ORDER — NALBUPHINE SYRINGE 5 MG/0.5 ML: 5.0000 mg | INJECTION | INTRAMUSCULAR | Status: DC | PRN

## 2012-01-29 MED ORDER — SODIUM BICARBONATE 8.4 % IV SOLN
INTRAVENOUS | Status: DC | PRN
  Administered 2012-01-29: 4 mL via EPIDURAL

## 2012-01-29 MED ORDER — PHENYLEPHRINE 40 MCG/ML (10ML) SYRINGE FOR IV PUSH (FOR BLOOD PRESSURE SUPPORT): 80.0000 ug | PREFILLED_SYRINGE | INTRAVENOUS | Status: DC | PRN

## 2012-01-29 NOTE — Progress Notes (Signed)
Incorrect time vital signs were taken at 2003

## 2012-01-29 NOTE — [facility]U Note (Addendum)
Pt states contractions every 2-3 minutes since this morning. Was dilated 3cm in MAU visit last night. Denies complications with this pregnancy. Ivonne Andrew CNM notified of cervical exam and orders requested for admission.

## 2012-01-29 NOTE — H&P (Signed)
Quad screen neg  I was present for the exam and agree with above.  Dorathy Kinsman 01/29/2012 5:25 PM

## 2012-01-29 NOTE — Progress Notes (Cosign Needed)
Carla Carson is a 26 y.o. G2P1001 at [redacted]w[redacted]d   Subjective: Doing well. Pain well controlled since epidural. No urge to push.   Objective: BP 102/54  Pulse 110  Resp 20  LMP 05/03/2011      FHT:  FHR: 140s bpm, variability: moderate,  accelerations:  Present,  decelerations:  Absent UC:   regular, every 3 minutes SVE:   Dilation: Lip/rim Effacement (%): 100 Station: 0 Exam by:: Dr Konrad Dolores  Labs: Lab Results  Component Value Date   WBC 27.5* 01/29/2012   HGB 13.3 01/29/2012   HCT 39.1 01/29/2012   MCV 88.1 01/29/2012   PLT 330 01/29/2012    Assessment / Plan: Spontaneous labor, progressing normally  Labor: Progressing normally and AROM w/ meconium stained fluid Fetal Wellbeing:  Category I Pain Control:  Epidural I/D:  n/a Anticipated MOD:  NSVD  Leann Mayweather 01/29/2012, 3:17 PM

## 2012-01-29 NOTE — MAU Note (Signed)
"  I had my membranes swept on Wednesday and now I'm contracting.  I had some spots of blood in my underwear before I came to the hospital.  (+) FM.  No leaking fluid, but my mucous plug."

## 2012-01-29 NOTE — H&P (Addendum)
Carla Carson is a 26 y.o. female presenting for labor and delivery. Maternal Medical History:  Reason for admission: Reason for admission: contractions.  Reason for Admission:   nauseaContractions: Onset was yesterday.   Frequency: regular.   Perceived severity is strong.   Contractions occuring every 2-11min  Fetal activity: Perceived fetal activity is normal.   Last perceived fetal movement was within the past hour.    Prenatal complications: no prenatal complications   OB History    Grav Para Term Preterm Abortions TAB SAB Ect Mult Living   2 1 1  0 0 0 0 0 0 1     Past Medical History  Diagnosis Date  . No pertinent past medical history    History reviewed. No pertinent past surgical history. Family History: family history is not on file. Social History:  reports that she quit smoking about 7 weeks ago. She quit smokeless tobacco use about 7 weeks ago. She reports that she does not drink alcohol or use illicit drugs.  Review of Systems  Constitutional: Negative for fever and chills.  Eyes: Negative for blurred vision and double vision.  Cardiovascular: Negative for chest pain.  Gastrointestinal: Negative for nausea, abdominal pain, diarrhea, constipation and blood in stool.       Emesis x1 in past day  Genitourinary: Negative for dysuria and hematuria.  Skin: Negative for rash.  Neurological: Negative for dizziness, sensory change, speech change and headaches.    Dilation: 7 Effacement (%): 100 Station: 0 Exam by:: Lucy Chris RNC Blood pressure 144/96, pulse 110, resp. rate 20, last menstrual period 05/03/2011. Maternal Exam:  Uterine Assessment: Contraction strength is firm.  Contraction frequency is regular.   Abdomen: Fundal height is Term.   Fetal presentation: vertex  Introitus: Normal vulva. Ferning test: not done.  Nitrazine test: not done. Bloody show from vagina  Pelvis: adequate for delivery.      Physical Exam  Constitutional: She is oriented  to person, place, and time. She appears well-developed and well-nourished.  HENT:  Head: Normocephalic.  Eyes: EOM are normal.  Neck: Normal range of motion.  Cardiovascular: Normal rate and regular rhythm.   Respiratory: Effort normal.  GI: Soft. There is no tenderness.  Musculoskeletal: Normal range of motion.  Neurological: She is alert and oriented to person, place, and time.  Skin: Skin is warm and dry.    Dilation: 7 Effacement (%): 100 Cervical Position: Anterior Station: 0 Presentation: Vertex Exam by:: Lucy Chris RNC   Prenatal labs: ABO, Rh: O/Positive/-- (10/08 0000) Antibody: Negative (10/08 0000) Rubella: Immune (10/08 0000) RPR: NON REAC (02/20 1021)  HBsAg: Negative (10/08 0000)  HIV: Non-reactive (10/08 0000)  GBS: Negative (03/16 0000)   Assessment/Plan: 25yo [redacted]w[redacted]d G2P1001 in active labor. Membranes stripped on Wed. W/ occasional bloody vaginal discharge since that time.  - admit to L&D - epidural as requested  - expectant management for a SVD.   Of note mother plans to breast feed. Nexplanon for birth control after delivery. [facility] for pediatric care.   Gurbani Figge 01/29/2012, 1:13 PM

## 2012-01-29 NOTE — Anesthesia Procedure Notes (Signed)
Epidural Patient location during procedure: OB  Preanesthetic Checklist Completed: patient identified, site marked, surgical consent, pre-op evaluation, timeout performed, IV checked, risks and benefits discussed and monitors and equipment checked  Epidural Patient position: sitting Prep: site prepped and draped and DuraPrep Patient monitoring: continuous pulse ox and blood pressure Approach: midline Injection technique: LOR air  Needle:  Needle type: Tuohy  Needle gauge: 17 G Needle length: 9 cm Needle insertion depth: 9 cm Catheter type: closed end flexible Catheter size: 19 Gauge Test dose: negative  Assessment Events: blood not aspirated, injection not painful, no injection resistance, negative IV test and no paresthesia  Additional Notes Dosing of Epidural:  1st dose, through needle ............................................. epi 1:200K + Xylocaine 40 mg  2nd dose, through catheter, after waiting 3 minutes.....epi 1:200K + Xylocaine 40 mg  3rd dose, through catheter after waiting 3 minutes .............................Marcaine   4mg   ( mg Marcaine are expressed as equivilent  cc's medication removed from the 0.1%Bupiv / fentanyl syringe from L&D pump)  ( 2% Xylo charted as a single dose in Epic Meds for ease of charting; actual dosing was fractionated as above, for saftey's sake)  As each dose occurred, patient was free of IV sx; and patient exhibited no evidence of SA injection.  Patient is more comfortable after epidural dosed. Please see RN's note for documentation of vital signs,and FHR which are stable.    

## 2012-01-29 NOTE — MAU Note (Signed)
WENT TO CLINIC   ON WED- STRIPPED MEMBRANES.   VE 3 CM     [facility] HURT BAD AT 0030

## 2012-01-29 NOTE — Anesthesia Preprocedure Evaluation (Signed)

## 2012-01-30 NOTE — Progress Notes (Signed)
Post Partum Day 1 s/p NSVD Subjective: no complaints, up ad lib, voiding, tolerating PO, + flatus and breast feeding.  Objective: Blood pressure 130/73, pulse 109, temperature 98.5 F (36.9 C), temperature source Oral, resp. rate 18, last menstrual period 05/03/2011, unknown if currently breastfeeding.  Physical Exam:  General: alert and no distress Lochia: appropriate Uterine Fundus: Firm, midline at umbilicus Incision: N/a DVT Evaluation: No evidence of DVT seen on physical exam. Negative Homan's sign. No cords or calf tenderness. No significant calf/ankle edema.   Basename 01/29/12 1334  HGB 13.3  HCT 39.1    Assessment/Plan: Plan for discharge tomorrow, Breastfeeding and Contraception :Nexplanon   LOS: 1 day   Carla Carson 01/30/2012, 7:31 AM

## 2012-01-30 NOTE — Progress Notes (Signed)
Attestation of Attending Supervision of Resident: Evaluation and management procedures were performed by the [facility] Resident under my supervision.  I have seen and examined the patient, reviewed the resident's note and chart, and I agree with management and plan.   Jaynie Collins, M.D. 01/30/2012 8:58 AM

## 2012-01-30 NOTE — Anesthesia Postprocedure Evaluation (Signed)
  Anesthesia Post-op Note  Patient: Carla Carson  Procedure(s) Performed: * No procedures listed *  Patient Location: Mother/Baby  Anesthesia Type: Epidural  Level of Consciousness: awake, alert  and oriented  Airway and Oxygen Therapy: Patient Spontanous Breathing  Post-op Pain: mild  Post-op Assessment: Patient's Cardiovascular Status Stable, Respiratory Function Stable, Patent Airway, No signs of Nausea or vomiting, Adequate PO intake and Pain level controlled  Post-op Vital Signs: stable  Complications: No apparent anesthesia complications

## 2012-01-31 MED ORDER — IBUPROFEN 600 MG PO TABS: 600.0000 mg | ORAL_TABLET | Freq: Four times a day (QID) | ORAL | Status: AC

## 2012-01-31 MED ORDER — DOCUSATE SODIUM 100 MG PO CAPS: 100.0000 mg | ORAL_CAPSULE | Freq: Two times a day (BID) | ORAL | Status: AC | PRN

## 2012-01-31 NOTE — Discharge Summary (Signed)
Discharge planning as included in document

## 2012-01-31 NOTE — Discharge Summary (Signed)
Obstetric Discharge Summary Reason for Admission: onset of labor Prenatal Procedures: none Intrapartum Procedures: spontaneous vaginal delivery Postpartum Procedures: none Complications-Operative and Postpartum: 2nd  degree perineal laceration Hemoglobin  Date Value Range Status  01/29/2012 13.3  12.0-15.0 (g/dL) Final     HCT  Date Value Range Status  01/29/2012 39.1  36.0-46.0 (%) Final    Physical Exam:  General: alert, cooperative and no distress Lochia: appropriate Uterine Fundus: firm Incision: N/a DVT Evaluation: No evidence of DVT seen on physical exam. Negative Homan's sign. No cords or calf tenderness. No significant calf/ankle edema.  Discharge Diagnoses: Term Pregnancy-delivered  Discharge Information: Date: 01/31/2012 Activity: pelvic rest Diet: routine Medications: PNV, Ibuprofen and Colace Condition: stable Instructions: refer to practice specific booklet Discharge to: home Follow-up Information    Follow up with [facility]. Schedule an appointment as soon as possible for a visit in 6 weeks.   Contact information:   [address] [place] [address]          Newborn Data: Live born female  Birth Weight: 7 lb 13 oz (3544 g) APGAR: 8, 9  Home with mother.  MATTHEWS,CODY 01/31/2012, 6:56 AM  I examined pt and agree with documentation above and resident plan of care. [facility]

## 2012-01-31 NOTE — Discharge Instructions (Signed)
Postpartum Care After Vaginal Delivery After you deliver your baby, you will stay in the hospital for 24 to 72 hours, unless there were problems with the labor or delivery, or you have medical problems. While you are in the hospital, you will receive help and instructions on how to care for yourself and your baby. Your doctor will order pain medicine, in case you need it. You will have a small amount of bleeding from your vagina and should change your sanitary pad frequently. Wash your hands thoroughly with soap and water for at least 20 seconds after changing pads and using the toilet. Let the nurses know if you begin to pass blood clots or your bleeding increases. Do not flush blood clots down the toilet before having the nurse look at them, to make sure there is no placental tissue with them. If you had an intravenous (IV), it will be removed within 24 hours, if there are no problems. The first time you get out of bed or take a shower, call the nurse to help you because you may get weak, lightheaded, or even faint. If you are breastfeeding, you may feel painful contractions of your uterus for a couple of weeks. This is normal. The contractions help your uterus get back to normal size. If you are not breastfeeding, wear a supportive bra and handle your breasts as little as possible until your milk has dried up. Hormones should not be given to dry up the breasts, because they can cause blood clots. You will be given your normal diet, unless you have diabetes or other medical problems.  The nurses may put an ice pack on your episiotomy (surgically enlarged opening), if you have one, to reduce the pain and swelling. On rare occasions, you may not be able to urinate and the nurse will need to empty your bladder with a catheter. If you had a postpartum tubal ligation ("tying tubes," female sterilization), it should not make your stay in the hospital longer. You may have your baby in your room with you as much as  you like, unless you or the baby has a problem. Use the bassinet (basket) for the baby when going to and from the nursery. Do not carry the baby. Do not leave the postpartum area. If the mother is Rh negative (lacks a protein on the red blood cells) and the baby is Rh positive, the mother should get a Rho-gam shot to prevent Rh problems with future pregnancies. You may be given written instructions for you and your baby, and necessary medicines, when you are discharged from the hospital. Be sure you understand and follow the instructions as advised. HOME CARE INSTRUCTIONS   Follow instructions and take the medicines given to you.   Only take over-the-counter or prescription medicines for pain, discomfort, or fever as directed by your caregiver.   Do not take aspirin, because it can cause bleeding.   Increase your activities a little bit every day to build up your strength and endurance.   Do not drink alcohol, especially if you are breastfeeding or taking pain medicine.   Take your temperature twice a day and record it.   You may have a small amount of bleeding or spotting for 2 to 4 weeks. This is normal.   Do not use tampons or douche. Use sanitary pads.   Try to have someone stay and help you for a few days when you go home.   Try to rest or take a nap when   the baby is sleeping.   If you are breastfeeding, wear a good support bra. If you are not breastfeeding, wear a supportive bra and do not stimulate your nipples.   Eat a healthy, nutritious diet and continue to take your prenatal vitamins.   Do not drive, do any heavy activities, or travel until your caregiver tells you it is okay.   Do not have intercourse until your caregiver gives you permission to do so.   Ask your caregiver when you can begin to exercise and what type of exercises to do.   Call your caregiver if you think you are having a problem from your delivery.   Call your pediatrician if you are having a problem  with the baby.   Schedule your postpartum visit and keep it.  SEEK MEDICAL CARE IF:   You have a temperature of 100 F (37.8 C) or higher.   You have increased vaginal bleeding or are passing clots. Save any clots to show your caregiver.   You have bloody urine or pain when you urinate.   You have a bad smelling vaginal discharge.   You have increasing pain or swelling on your episiotomy.   You develop a severe headache.   You feel depressed.   The episiotomy is separating.   You become dizzy or lightheaded.   You develop a rash.   You have a reaction or problems with your medicine.   You have pain, redness, or swelling at the intravenous site.  SEEK IMMEDIATE MEDICAL CARE IF:   You have chest pain.   You develop shortness of breath.   You pass out.   You develop pain, with or without swelling or redness in your leg.   You develop heavy vaginal bleeding, with or without blood clots.   You develop stomach pain.   You develop a bad smelling vaginal discharge.  MAKE SURE YOU:   Understand these instructions.   Will watch your condition.   Will get help right away if you are not doing well or get worse.  Document Released: 08/09/2007 Document Revised: 10/01/2011 Document Reviewed: 08/21/2009 [employer] Patient Information 2012 [employer]. Breastfeeding BENEFITS OF BREASTFEEDING For the baby  The first milk (colostrum) helps the baby's digestive system function better.   There are antibodies from the mother in the milk that help the baby fight off infections.   The baby has a lower incidence of asthma, allergies, and SIDS (sudden infant death syndrome).   The nutrients in breast milk are better than formulas for the baby and helps the baby's brain grow better.   Babies who breastfeed have less gas, colic, and constipation.  For the mother  Breastfeeding helps develop a very special bond between mother and baby.   It is more convenient, always  available at the correct temperature and cheaper than formula feeding.   It burns calories in the mother and helps with losing weight that was gained during pregnancy.   It makes the uterus contract back down to normal size faster and slows bleeding following delivery.   Breastfeeding mothers have a lower risk of developing breast cancer.  NURSE FREQUENTLY  A healthy, full-term baby may breastfeed as often as every hour or space his or her feedings to every 3 hours.   How often to nurse will vary from baby to baby. Watch your baby for signs of hunger, not the clock.   Nurse as often as the baby requests, or when you feel the need to   reduce the fullness of your breasts.   Awaken the baby if it has been 3 to 4 hours since the last feeding.   Frequent feeding will help the mother make more milk and will prevent problems like sore nipples and engorgement of the breasts.  BABY'S POSITION AT THE BREAST  Whether lying down or sitting, be sure that the baby's tummy is facing your tummy.   Support the breast with 4 fingers underneath the breast and the thumb above. Make sure your fingers are well away from the nipple and baby's mouth.   Stroke the baby's lips and cheek closest to the breast gently with your finger or nipple.   When the baby's mouth is open wide enough, place all of your nipple and as much of the dark area around the nipple as possible into your baby's mouth.   Pull the baby in close so the tip of the nose and the baby's cheeks touch the breast during the feeding.  FEEDINGS  The length of each feeding varies from baby to baby and from feeding to feeding.   The baby must suck about 2 to 3 minutes for your milk to get to him or her. This is called a "let down." For this reason, allow the baby to feed on each breast as long as he or she wants. Your baby will end the feeding when he or she has received the right balance of nutrients.   To break the suction, put your finger into  the corner of the baby's mouth and slide it between his or her gums before removing your breast from his or her mouth. This will help prevent sore nipples.  REDUCING BREAST ENGORGEMENT  In the first week after your baby is born, you may experience signs of breast engorgement. When breasts are engorged, they feel heavy, warm, full, and may be tender to the touch. You can reduce engorgement if you:   Nurse frequently, every 2 to 3 hours. Mothers who breastfeed early and often have fewer problems with engorgement.   Place light ice packs on your breasts between feedings. This reduces swelling. Wrap the ice packs in a lightweight towel to protect your skin.   Apply moist hot packs to your breast for 5 to 10 minutes before each feeding. This increases circulation and helps the milk flow.   Gently massage your breast before and during the feeding.   Make sure that the baby empties at least one breast at every feeding before switching sides.   Use a breast pump to empty the breasts if your baby is sleepy or not nursing well. You may also want to pump if you are returning to work or or you feel you are getting engorged.   Avoid bottle feeds, pacifiers or supplemental feedings of water or juice in place of breastfeeding.   Be sure the baby is latched on and positioned properly while breastfeeding.   Prevent fatigue, stress, and anemia.   Wear a supportive bra, avoiding underwire styles.   Eat a balanced diet with enough fluids.  If you follow these suggestions, your engorgement should improve in 24 to 48 hours. If you are still experiencing difficulty, call your lactation consultant or caregiver. IS MY BABY GETTING ENOUGH MILK? Sometimes, mothers worry about whether their babies are getting enough milk. You can be assured that your baby is getting enough milk if:  The baby is actively sucking and you hear swallowing.   The baby nurses at least 8 to   12 times in a 24 hour time period. Nurse your  baby until he or she unlatches or falls asleep at the first breast (at least 10 to 20 minutes), then offer the second side.   The baby is wetting 5 to 6 disposable diapers (6 to 8 cloth diapers) in a 24 hour period by 5 to 6 days of age.   The baby is having at least 2 to 3 stools every 24 hours for the first few months. Breast milk is all the food your baby needs. It is not necessary for your baby to have water or formula. In fact, to help your breasts make more milk, it is best not to give your baby supplemental feedings during the early weeks.   The stool should be soft and yellow.   The baby should gain 4 to 7 ounces per week after he is 4 days old.  TAKE CARE OF YOURSELF Take care of your breasts by:  Bathing or showering daily.   Avoiding the use of soaps on your nipples.   Start feedings on your left breast at one feeding and on your right breast at the next feeding.   You will notice an increase in your milk supply 2 to 5 days after delivery. You may feel some discomfort from engorgement, which makes your breasts very firm and often tender. Engorgement "peaks" out within 24 to 48 hours. In the meantime, apply warm moist towels to your breasts for 5 to 10 minutes before feeding. Gentle massage and expression of some milk before feeding will soften your breasts, making it easier for your baby to latch on. Wear a well fitting nursing bra and air dry your nipples for 10 to 15 minutes after each feeding.   Only use cotton bra pads.   Only use pure lanolin on your nipples after nursing. You do not need to wash it off before nursing.  Take care of yourself by:   Eating well-balanced meals and nutritious snacks.   Drinking milk, fruit juice, and water to satisfy your thirst (about 8 glasses a day).   Getting plenty of rest.   Increasing calcium in your diet (1200 mg a day).   Avoiding foods that you notice affect the baby in a bad way.  SEEK MEDICAL CARE IF:   You have any  questions or difficulty with breastfeeding.   You need help.   You have a hard, red, sore area on your breast, accompanied by a fever of 100.5 F (38.1 C) or more.   Your baby is too sleepy to eat well or is having trouble sleeping.   Your baby is wetting less than 6 diapers per day, by 5 days of age.   Your baby's skin or white part of his or her eyes is more yellow than it was in the hospital.   You feel depressed.  Document Released: 10/12/2005 Document Revised: 10/01/2011 Document Reviewed: 05/27/2009 [employer] Patient Information 2012 [employer]. 

## 2012-02-01 NOTE — Progress Notes (Signed)
Post discharge chart review completed.  

## 2012-02-03 ENCOUNTER — Encounter: Payer: Medicaid Other | Admitting: Advanced Practice Midwife

## 2012-02-26 ENCOUNTER — Ambulatory Visit (INDEPENDENT_AMBULATORY_CARE_PROVIDER_SITE_OTHER): Payer: Medicaid Other | Admitting: Family Medicine

## 2012-02-26 ENCOUNTER — Encounter: Payer: Self-pay | Admitting: Family Medicine

## 2012-02-26 NOTE — Progress Notes (Signed)
  Subjective:     Carla Carson is a 26 y.o. female who presents for a postpartum visit. She is 4 weeks postpartum following a spontaneous vaginal delivery. I have fully reviewed the prenatal and intrapartum course. The delivery was at 38 gestational weeks. Outcome: spontaneous vaginal delivery. Anesthesia: epidural. Postpartum course has been normal. Baby's course has been normal. Baby is feeding by breast. Bleeding no bleeding. Bowel function is normal. Bladder function is normal. Patient is not sexually active. Contraception method is Nexplanon. Postpartum depression screening: negative.  The following portions of the patient's history were reviewed and updated as appropriate: allergies, current medications, past family history, past medical history, past social history, past surgical history and problem list.  Review of Systems Pertinent items are noted in HPI.   Objective:    BP 124/78  Pulse 94  Temp 98.5 F (36.9 C)  Ht 5\' 2"  (1.575 m)  Wt 241 lb 6.4 oz (109.498 kg)  BMI 44.15 kg/m2  Breastfeeding? Yes  General:  alert, cooperative and no distress     Lungs: clear to auscultation bilaterally  Heart:  regular rate and rhythm, S1, S2 normal, no murmur, click, rub or gallop  Abdomen: soft, non-tender; bowel sounds normal; no masses,  no organomegaly        Assessment:    Normal postpartum exam. Pap smear not done at today's visit.   Plan:    1. Contraception: Nexplanon 2.  Follow up in: 2 weeks or as needed.

## 2012-02-26 NOTE — Patient Instructions (Signed)

## 2012-03-30 ENCOUNTER — Ambulatory Visit: Payer: Medicaid Other | Admitting: Obstetrics & Gynecology

## 2012-06-18 ENCOUNTER — Encounter (HOSPITAL_COMMUNITY): Payer: Self-pay | Admitting: Emergency Medicine

## 2012-06-18 ENCOUNTER — Emergency Department (HOSPITAL_COMMUNITY)
Admission: EM | Admit: 2012-06-18 | Discharge: 2012-06-19 | Disposition: A | Discharge status: Home / Self Care | Payer: Self-pay | Attending: Emergency Medicine | Admitting: Emergency Medicine

## 2012-06-18 DIAGNOSIS — R1011 Right upper quadrant pain: Secondary | ICD-10-CM | POA: Insufficient documentation

## 2012-06-18 DIAGNOSIS — R109 Unspecified abdominal pain: Secondary | ICD-10-CM

## 2012-06-18 DIAGNOSIS — R079 Chest pain, unspecified: Secondary | ICD-10-CM | POA: Insufficient documentation

## 2012-06-18 NOTE — ED Notes (Signed)
Pt alert, arrives via EMS, c/o lower abd pain, onet was a few days ago, recent missed period, resp even unlabored, skin pwd

## 2012-06-18 NOTE — ED Notes (Signed)
Sharp pain in RUQ of abdomen. Pt denies n/v.

## 2012-06-19 ENCOUNTER — Emergency Department (HOSPITAL_COMMUNITY): Payer: Self-pay

## 2012-06-19 LAB — COMPREHENSIVE METABOLIC PANEL
AST: 18 U/L (ref 0–37)
CO2: 28 mEq/L (ref 19–32)
Chloride: 102 mEq/L (ref 96–112)
Creatinine, Ser: 0.73 mg/dL (ref 0.50–1.10)
GFR calc non Af Amer: >90 mL/min (ref >90)
Total Bilirubin: 0.2 mg/dL — ABNORMAL LOW (ref 0.3–1.2)

## 2012-06-19 LAB — URINALYSIS, ROUTINE W REFLEX MICROSCOPIC
Ketones, ur: NEGATIVE mg/dL
Nitrite: NEGATIVE
Protein, ur: NEGATIVE mg/dL
Urobilinogen, UA: 1 mg/dL (ref 0.0–1.0)

## 2012-06-19 LAB — CBC WITH DIFFERENTIAL/PLATELET
Basophils Absolute: 0 10*3/uL (ref 0.0–0.1)
Basophils Relative: 0 % (ref 0–1)
Eosinophils Absolute: 0.4 10*3/uL (ref 0.0–0.7)
Eosinophils Relative: 4 % (ref 0–5)
Lymphocytes Relative: 32 % (ref 12–46)
MCH: 29.4 pg (ref 26.0–34.0)
MCV: 88 fL (ref 78.0–100.0)
Platelets: 337 10*3/uL (ref 150–400)
RDW: 13.9 % (ref 11.5–15.5)
WBC: 11.6 10*3/uL — ABNORMAL HIGH (ref 4.0–10.5)

## 2012-06-19 LAB — PREGNANCY, URINE: Preg Test, Ur: NEGATIVE

## 2012-06-19 LAB — URINE MICROSCOPIC-ADD ON

## 2012-06-19 MED ORDER — ONDANSETRON HCL 4 MG PO TABS: 4.0000 mg | ORAL_TABLET | Freq: Four times a day (QID) | ORAL | Status: AC

## 2012-06-19 MED ORDER — HYDROCODONE-ACETAMINOPHEN 5-500 MG PO TABS: 1.0000 | ORAL_TABLET | Freq: Four times a day (QID) | ORAL | Status: AC | PRN

## 2012-06-19 MED ORDER — ONDANSETRON HCL 4 MG/2ML IJ SOLN
4.0000 mg | Freq: Once | INTRAMUSCULAR | Status: AC
  Administered 2012-06-19: 4 mg via INTRAVENOUS
  Filled 2012-06-19: qty 2

## 2012-06-19 MED ORDER — MORPHINE SULFATE 4 MG/ML IJ SOLN
4.0000 mg | Freq: Once | INTRAMUSCULAR | Status: AC
  Administered 2012-06-19: 4 mg via INTRAVENOUS
  Filled 2012-06-19: qty 1

## 2012-06-19 NOTE — ED Notes (Signed)
Pt sts she is having pain in her right side, under her ribs and sts that it hurts more with coughing, sneezing and movement. Denies radiation, has not taken anything for pain. No associated N/v/d. Patient afebrile at this time. Patient sts she does have a stuffy head and nasal drainage, slight headache, denies associated cough.

## 2012-06-19 NOTE — ED Notes (Signed)
Patient given discharge instructions, information, prescriptions, and diet order. Patient states that they adequately understand discharge information given and to return to ED if symptoms return or worsen.     

## 2012-06-19 NOTE — ED Provider Notes (Signed)
History     CSN: [id number]  Arrival date & time 06/18/12  2325   First MD Initiated Contact with Patient 06/19/12 0217      Chief Complaint  Patient presents with  . Abdominal Pain    (Consider location/radiation/quality/duration/timing/severity/associated sxs/prior treatment) HPI  Patient to the ER with complaints of RUQ and right lower rib onset a few days ago. The pain only happens when she sneezes or bares down. Otherwise she has no pain. The patient states that she kept sneezing tonight which was aggrevating the pain.She delivered a baby in April and says she missed her most recent period. She denies having N/V/D. She denies having SOB, wheezing, chest pain or a hx of asthma. The patient denies feeling weak or tired. She is in NAD at this time and is smiling as she talks. She is not having pain so long as she doesn't do valsalva maneuvers.   Past Medical History  Diagnosis Date  . No pertinent past medical history     History reviewed. No pertinent past surgical history.  History reviewed. No pertinent family history.  History  Substance Use Topics  . Smoking status: Current Some Day Smoker -- 0.1 packs/day    Types: Cigarettes    Last Attempt to Quit: 12/09/2011  . Smokeless tobacco: Never Used  . Alcohol Use: No    OB History    Grav Para Term Preterm Abortions TAB SAB Ect Mult Living   2 2 2  0 0 0 0 0 0 2      Review of Systems  All other systems reviewed and are negative.    Allergies  Review of patient's allergies indicates no known allergies.  Home Medications   Current Outpatient Rx  Name Route Sig Dispense Refill  . IBUPROFEN 200 MG PO TABS Oral Take 400 mg by mouth every 6 (six) hours as needed.    Marland Kitchen HYDROCODONE-ACETAMINOPHEN 5-500 MG PO TABS Oral Take 1-2 tablets by mouth every 6 (six) hours as needed for pain. 15 tablet 0  . ONDANSETRON HCL 4 MG PO TABS Oral Take 1 tablet (4 mg total) by mouth every 6 (six) hours. 12 tablet 0    BP 135/83   Pulse 104  Temp 98.6 F (37 C)  Resp 20  Ht 5\' 2"  (1.575 m)  Wt 246 lb (111.585 kg)  BMI 44.99 kg/m2  SpO2 100%  LMP 05/13/2012  Physical Exam  Nursing note and vitals reviewed. Constitutional: She appears well-developed and well-nourished. No distress.  HENT:  Head: Normocephalic and atraumatic.  Eyes: Pupils are equal, round, and reactive to light.  Neck: Normal range of motion. Neck supple.  Cardiovascular: Normal rate and regular rhythm.   Pulmonary/Chest: Effort normal.  Abdominal: Soft. She exhibits no distension and no mass. There is no tenderness. There is no rebound and no guarding.  Neurological: She is alert.  Skin: Skin is warm and dry.    ED Course  Procedures (including critical care time)  Labs Reviewed  URINALYSIS, ROUTINE W REFLEX MICROSCOPIC - Abnormal; Notable for the following:    APPearance CLOUDY (*)     Leukocytes, UA MODERATE (*)     All other components within normal limits  URINE MICROSCOPIC-ADD ON - Abnormal; Notable for the following:    Squamous Epithelial / LPF MANY (*)     All other components within normal limits  COMPREHENSIVE METABOLIC PANEL - Abnormal; Notable for the following:    Glucose, Bld 106 (*)     Albumin  3.3 (*)     Alkaline Phosphatase 120 (*)     Total Bilirubin 0.2 (*)     All other components within normal limits  CBC WITH DIFFERENTIAL - Abnormal; Notable for the following:    WBC 11.6 (*)     All other components within normal limits  PREGNANCY, URINE  LIPASE, BLOOD   US Abdomen Complete  06/19/2012  *RADIOLOGY REPORT*  Clinical Data:  Right upper quadrant abdominal pain  COMPLETE ABDOMINAL ULTRASOUND  Comparison:  None.  Findings:  Gallbladder:  Contracted gallbladder.  No definite stones.  No wall thickening or Murphy's sign.  Common bile duct:  Measures 3 mm, within normal limits.  Liver:  No focal lesion identified.  Mild heterogeneity of the parenchymal echogenicity.  IVC:  Appears normal.  Pancreas:  No focal  abnormality seen.  Spleen:  Measures 7.2 cm.  No focal abnormality.  Right Kidney:  Measures 11.8 cm.  No hydronephrosis or focal abnormality.  Left Kidney:  Measures 11.4 cm.  No hydronephrosis or focal abnormality.  Abdominal aorta:  No aneurysm identified. Bifurcation obscured by overlying bowel gas artifact.  IMPRESSION: Contracted gallbladderwithout sonographic evidence for cholecystitis.  Mild heterogeneity of the liver echogenicity is nonspecific however can be seen in the setting of fatty infiltration or hepatitis. Correlate with LFTs.   Original Report Authenticated By: Waneta Martins, M.D.      1. Abdominal pain       MDM  nofindings on  Physical exam. No Murphys sign. The patient has remained pain free while in the ED and states "well I havent sneezed". She states that it feels better if she hold her RUQ when she sneezes to make pain better. I suspect possible small hernia poking through abdominal wall. Will refer to GI for further evaluation. Pt given pain and nausea medications in case she finds she needs them. PT given strict return to ED rpecautions.  Pt has been advised of the symptoms that warrant their return to the ED. Patient has voiced understanding and has agreed to follow-up with the PCP or specialist.         Dorthula Matas, PA 06/19/12 [phone]

## 2012-06-23 NOTE — ED Provider Notes (Signed)
Medical screening examination/treatment/procedure(s) were conducted as a shared visit with non-physician practitioner(s) and myself.  I personally evaluated the patient during the encounter.  Right upper quadrant pain. No acute abdomen. Ultrasound shows no obvious cholecystitis  Donnetta Hutching, MD 06/23/12 1747

## 2012-12-07 ENCOUNTER — Ambulatory Visit: Payer: Medicaid Other | Admitting: Obstetrics and Gynecology

## 2012-12-09 ENCOUNTER — Ambulatory Visit: Payer: Medicaid Other | Admitting: Obstetrics & Gynecology

## 2013-01-11 ENCOUNTER — Encounter (HOSPITAL_COMMUNITY): Payer: Self-pay | Admitting: *Deleted

## 2013-01-11 ENCOUNTER — Inpatient Hospital Stay (HOSPITAL_COMMUNITY)
Admission: AD | Admit: 2013-01-11 | Discharge: 2013-01-11 | Disposition: A | Discharge status: Home / Self Care | Payer: Medicaid Other | Source: Ambulatory Visit | Attending: Family Medicine | Admitting: Family Medicine

## 2013-01-11 DIAGNOSIS — Z3202 Encounter for pregnancy test, result negative: Secondary | ICD-10-CM | POA: Insufficient documentation

## 2013-01-11 DIAGNOSIS — A5901 Trichomonal vulvovaginitis: Secondary | ICD-10-CM | POA: Insufficient documentation

## 2013-01-11 DIAGNOSIS — R350 Frequency of micturition: Secondary | ICD-10-CM | POA: Insufficient documentation

## 2013-01-11 DIAGNOSIS — N390 Urinary tract infection, site not specified: Secondary | ICD-10-CM | POA: Insufficient documentation

## 2013-01-11 DIAGNOSIS — N949 Unspecified condition associated with female genital organs and menstrual cycle: Secondary | ICD-10-CM | POA: Insufficient documentation

## 2013-01-11 LAB — URINE MICROSCOPIC-ADD ON

## 2013-01-11 LAB — URINALYSIS, ROUTINE W REFLEX MICROSCOPIC
Glucose, UA: NEGATIVE mg/dL
Nitrite: NEGATIVE
Specific Gravity, Urine: >1.03 — ABNORMAL HIGH (ref 1.005–1.030)
pH: 6 (ref 5.0–8.0)

## 2013-01-11 LAB — WET PREP, GENITAL: Yeast Wet Prep HPF POC: NONE SEEN

## 2013-01-11 LAB — POCT PREGNANCY, URINE: Preg Test, Ur: NEGATIVE

## 2013-01-11 MED ORDER — SULFAMETHOXAZOLE-TRIMETHOPRIM 800-160 MG PO TABS: 1.0000 | ORAL_TABLET | Freq: Two times a day (BID) | ORAL | Status: DC

## 2013-01-11 MED ORDER — METRONIDAZOLE 500 MG PO TABS
2000.0000 mg | ORAL_TABLET | Freq: Once | ORAL | Status: AC
  Administered 2013-01-11: 2000 mg via ORAL
  Filled 2013-01-11: qty 4

## 2013-01-11 NOTE — MAU Note (Signed)
Pt presents with complaints of possible pregnancy,and STD check.

## 2013-01-11 NOTE — MAU Provider Note (Signed)
Chart reviewed and agree with management and plan.  

## 2013-01-11 NOTE — MAU Provider Note (Signed)
History     CSN: [id number]  Arrival date and time: 01/11/13 1117   First Provider Initiated Contact with Patient 01/11/13 1325      Chief Complaint  Patient presents with  . Possible Pregnancy  . Nausea   HPI Carla Carson 27 y.o. Comes to MAU with urinary frequency and thinks she might be pregnant.  Having some vaginal discharge.  Wants STD check.  Denies dysuria.  OB History   Grav Para Term Preterm Abortions TAB SAB Ect Mult Living   2 2 2  0 0 0 0 0 0 2      Past Medical History  Diagnosis Date  . No pertinent past medical history     History reviewed. No pertinent past surgical history.  History reviewed. No pertinent family history.  History  Substance Use Topics  . Smoking status: Current Some Day Smoker -- 0.10 packs/day    Types: Cigarettes    Last Attempt to Quit: 12/09/2011  . Smokeless tobacco: Never Used  . Alcohol Use: No    Allergies: Not on File  Prescriptions prior to admission  Medication Sig Dispense Refill  . ibuprofen (ADVIL,MOTRIN) 200 MG tablet Take 400 mg by mouth every 6 (six) hours as needed.        Review of Systems  Constitutional: Negative for fever.  Gastrointestinal: Positive for abdominal pain. Negative for nausea, vomiting, diarrhea and constipation.  Genitourinary: Positive for frequency. Negative for dysuria.   Physical Exam   Blood pressure 106/67, pulse 98, temperature 98.3 F (36.8 C), temperature source Oral, resp. rate 20, height 5' 3.5" (1.613 m), weight 258 lb 9.6 oz (117.3 kg), last menstrual period 12/20/2012, SpO2 100.00%.  Physical Exam  Nursing note and vitals reviewed. Constitutional: She is oriented to person, place, and time. She appears well-developed and well-nourished.  HENT:  Head: Normocephalic.  Eyes: EOM are normal.  Neck: Neck supple.  GI: Soft. There is tenderness. There is no rebound and no guarding.  Genitourinary:  Speculum exam: Vulva - dried discharge seen and extends to top of  thighs Vagina - Mod amount of creamy discharge, Cervix - No contact bleeding Bimanual exam: Cervix closed Uterus unable to size due to habitus, tender on exam Tenderness noted over bladder Adnexa non tender, no masses bilaterally GC/Chlam, wet prep done Chaperone present for exam.  Musculoskeletal: Normal range of motion.  Neurological: She is alert and oriented to person, place, and time.  Skin: Skin is warm and dry.  Psychiatric: She has a normal mood and affect.    MAU Course  Procedures  MDM Results for orders placed during the hospital encounter of 01/11/13 (from the past 24 hour(s))  URINALYSIS, ROUTINE W REFLEX MICROSCOPIC     Status: Abnormal   Collection Time    01/11/13 11:45 AM      Result Value Range   Color, Urine YELLOW  YELLOW   APPearance CLOUDY (*) CLEAR   Specific Gravity, Urine >1.030 (*) 1.005 - 1.030   pH 6.0  5.0 - 8.0   Glucose, UA NEGATIVE  NEGATIVE mg/dL   Hgb urine dipstick TRACE (*) NEGATIVE   Bilirubin Urine NEGATIVE  NEGATIVE   Ketones, ur NEGATIVE  NEGATIVE mg/dL   Protein, ur NEGATIVE  NEGATIVE mg/dL   Urobilinogen, UA 0.2  0.0 - 1.0 mg/dL   Nitrite NEGATIVE  NEGATIVE   Leukocytes, UA MODERATE (*) NEGATIVE  URINE MICROSCOPIC-ADD ON     Status: Abnormal   Collection Time    01/11/13 11:45  AM      Result Value Range   Squamous Epithelial / LPF MANY (*) RARE   WBC, UA 21-50  <3 WBC/hpf   Bacteria, UA FEW (*) RARE   Urine-Other MUCOUS PRESENT    POCT PREGNANCY, URINE     Status: None   Collection Time    01/11/13 11:55 AM      Result Value Range   Preg Test, Ur NEGATIVE  NEGATIVE  WET PREP, GENITAL     Status: Abnormal   Collection Time    01/11/13  1:40 PM      Result Value Range   Yeast Wet Prep HPF POC NONE SEEN  NONE SEEN   Trich, Wet Prep MANY (*) NONE SEEN   Clue Cells Wet Prep HPF POC NONE SEEN  NONE SEEN   WBC, Wet Prep HPF POC MANY (*) NONE SEEN     Assessment and Plan  UTI Trichomonas  Plan Metronidazole 2 gm po  single dose in MAU Cultures pending You have been diagnosed with a sexually transmitted disease.  Your need to be treated and your partner(s) will need to be treated.  No sex until 10 days after you finished your medicine and no sex until 10 days after your partner has taken their medication. RX septra ds one PO bid x3days (#6) no refills Drink at least 8 8-oz glasses of water every day.   Lliam Hoh 01/11/2013, 1:57 PM

## 2013-01-11 NOTE — MAU Note (Signed)
Patient states she has been having abdominal pain on and off for about 2 weeks. Has had spotting off and on and urinary frequency. States she is having nausea. Clear vaginal discharge.

## 2013-01-12 LAB — URINE CULTURE

## 2013-01-12 LAB — GC/CHLAMYDIA PROBE AMP: CT Probe RNA: POSITIVE — AB

## 2013-09-04 ENCOUNTER — Emergency Department (HOSPITAL_COMMUNITY)
Admission: EM | Admit: 2013-09-04 | Discharge: 2013-09-04 | Disposition: A | Discharge status: Home / Self Care | Payer: Medicaid Other | Attending: Emergency Medicine | Admitting: Emergency Medicine

## 2013-09-04 ENCOUNTER — Encounter (HOSPITAL_COMMUNITY): Payer: Self-pay | Admitting: Emergency Medicine

## 2013-09-04 ENCOUNTER — Emergency Department (HOSPITAL_COMMUNITY): Payer: Medicaid Other

## 2013-09-04 DIAGNOSIS — A499 Bacterial infection, unspecified: Secondary | ICD-10-CM | POA: Insufficient documentation

## 2013-09-04 DIAGNOSIS — R197 Diarrhea, unspecified: Secondary | ICD-10-CM | POA: Insufficient documentation

## 2013-09-04 DIAGNOSIS — N83209 Unspecified ovarian cyst, unspecified side: Secondary | ICD-10-CM | POA: Insufficient documentation

## 2013-09-04 DIAGNOSIS — R112 Nausea with vomiting, unspecified: Secondary | ICD-10-CM | POA: Insufficient documentation

## 2013-09-04 DIAGNOSIS — F17200 Nicotine dependence, unspecified, uncomplicated: Secondary | ICD-10-CM | POA: Insufficient documentation

## 2013-09-04 DIAGNOSIS — R51 Headache: Secondary | ICD-10-CM | POA: Insufficient documentation

## 2013-09-04 DIAGNOSIS — N760 Acute vaginitis: Secondary | ICD-10-CM | POA: Insufficient documentation

## 2013-09-04 DIAGNOSIS — Z3202 Encounter for pregnancy test, result negative: Secondary | ICD-10-CM | POA: Insufficient documentation

## 2013-09-04 DIAGNOSIS — B9689 Other specified bacterial agents as the cause of diseases classified elsewhere: Secondary | ICD-10-CM | POA: Insufficient documentation

## 2013-09-04 LAB — CBC WITH DIFFERENTIAL/PLATELET
Basophils Absolute: 0 10*3/uL (ref 0.0–0.1)
Eosinophils Absolute: 0.2 10*3/uL (ref 0.0–0.7)
Eosinophils Relative: 3 % (ref 0–5)
HCT: 37.5 % (ref 36.0–46.0)
Lymphs Abs: 2.8 10*3/uL (ref 0.7–4.0)
MCH: 29.3 pg (ref 26.0–34.0)
MCV: 88 fL (ref 78.0–100.0)
Monocytes Absolute: 0.9 10*3/uL (ref 0.1–1.0)
Platelets: 288 10*3/uL (ref 150–400)
RDW: 14.1 % (ref 11.5–15.5)

## 2013-09-04 LAB — URINALYSIS, ROUTINE W REFLEX MICROSCOPIC
Bilirubin Urine: NEGATIVE
Glucose, UA: NEGATIVE mg/dL
Ketones, ur: NEGATIVE mg/dL
Protein, ur: NEGATIVE mg/dL
pH: 7.5 (ref 5.0–8.0)

## 2013-09-04 LAB — URINE MICROSCOPIC-ADD ON

## 2013-09-04 LAB — WET PREP, GENITAL
Trich, Wet Prep: NONE SEEN
Yeast Wet Prep HPF POC: NONE SEEN

## 2013-09-04 LAB — POCT I-STAT, CHEM 8
BUN: 6 mg/dL (ref 6–23)
Calcium, Ion: 1.18 mmol/L (ref 1.12–1.23)
HCT: 40 % (ref 36.0–46.0)
Hemoglobin: 13.6 g/dL (ref 12.0–15.0)
Sodium: 143 mEq/L (ref 135–145)
TCO2: 25 mmol/L (ref 0–100)

## 2013-09-04 LAB — POCT PREGNANCY, URINE: Preg Test, Ur: NEGATIVE

## 2013-09-04 MED ORDER — METRONIDAZOLE 500 MG PO TABS: 500.0000 mg | ORAL_TABLET | Freq: Two times a day (BID) | ORAL | Status: DC

## 2013-09-04 MED ORDER — HYDROCODONE-ACETAMINOPHEN 5-325 MG PO TABS
1.0000 | ORAL_TABLET | Freq: Once | ORAL | Status: AC
  Administered 2013-09-04: 1 via ORAL
  Filled 2013-09-04: qty 1

## 2013-09-04 MED ORDER — HYDROCODONE-ACETAMINOPHEN 5-325 MG PO TABS: 1.0000 | ORAL_TABLET | ORAL | Status: DC | PRN

## 2013-09-04 NOTE — ED Provider Notes (Addendum)
Medical screening examination/treatment/procedure(s) were performed by non-physician practitioner and as supervising physician I was immediately available for consultation/collaboration.  EKG Interpretation   None        Jessicaann Overbaugh L Tehilla Coffel, MD 09/12/13 1121 

## 2013-09-04 NOTE — ED Notes (Signed)
Pt reports several day HX of diarrhea ,once in past 24hr. Pt also reports a HA on RT side of head.HA pain of 7/10 . Pt also reports a clear Vaginal Discharge.for a long time.

## 2013-09-04 NOTE — ED Provider Notes (Signed)
CSN: [id number]     Arrival date & time 09/04/13  0865 History   First MD Initiated Contact with Patient 09/04/13 [phone]     Chief Complaint  Patient presents with  . Headache  . Vaginal Discharge  . Diarrhea   (Consider location/radiation/quality/duration/timing/severity/associated sxs/prior Treatment) HPI Comments: Patient is otherwise healthy 27 year old female who presents with 2 week history of multiple complaints including crampy episodic abdominal pain, diarrhea (non-bloody), several episodes of nausea and vomiting, no change in appetite, right sided headache.  She denies abdominal pain at this time, thinks she may be pregnant, reports clear - white thin vaginal discharge with odor, denies itching or vaginal bleeding.  She reports nausea and vomiting but none in the past 2-3 days.  Reports no prior surgeries.  Patient is a 27 y.o. female presenting with vaginal discharge, diarrhea, and abdominal pain. The history is provided by the patient. No language interpreter was used.  Vaginal Discharge Associated symptoms: abdominal pain, nausea and vomiting   Associated symptoms: no dysuria and no fever   Diarrhea Associated symptoms: abdominal pain, headaches and vomiting   Associated symptoms: no fever   Abdominal Pain Pain location:  LLQ, suprapubic and RLQ Pain quality: cramping, sharp and shooting   Pain radiates to:  Does not radiate Pain severity:  Moderate Onset quality:  Sudden Duration:  2 weeks Timing:  Intermittent Progression:  Worsening Chronicity:  New Relieved by:  Nothing Worsened by:  Nothing tried Ineffective treatments:  None tried Associated symptoms: diarrhea, nausea, vaginal discharge and vomiting   Associated symptoms: no anorexia, no chest pain, no constipation, no dysuria, no fever, no hematemesis, no hematuria, no shortness of breath and no vaginal bleeding     Past Medical History  Diagnosis Date  . No pertinent past medical history    History  reviewed. No pertinent past surgical history. No family history on file. History  Substance Use Topics  . Smoking status: Current Some Day Smoker -- 0.10 packs/day    Types: Cigarettes    Last Attempt to Quit: 12/09/2011  . Smokeless tobacco: Never Used  . Alcohol Use: No   OB History   Grav Para Term Preterm Abortions TAB SAB Ect Mult Living   2 2 2  0 0 0 0 0 0 2     Review of Systems  Constitutional: Negative for fever.  Respiratory: Negative for shortness of breath.   Cardiovascular: Negative for chest pain.  Gastrointestinal: Positive for nausea, vomiting, abdominal pain and diarrhea. Negative for constipation, anorexia and hematemesis.  Genitourinary: Positive for vaginal discharge. Negative for dysuria, hematuria and vaginal bleeding.  Neurological: Positive for headaches.  All other systems reviewed and are negative.    Allergies  Review of patient's allergies indicates no known allergies.  Home Medications  No current outpatient prescriptions on file. BP 145/94  Pulse 78  Temp(Src) 98.6 F (37 C) (Oral)  Resp 16  SpO2 100% Physical Exam  Nursing note and vitals reviewed. Constitutional: She is oriented to person, place, and time. She appears well-developed and well-nourished. No distress.  HENT:  Head: Normocephalic and atraumatic.  Right Ear: External ear normal.  Left Ear: External ear normal.  Nose: Nose normal.  Mouth/Throat: Oropharynx is clear and moist. No oropharyngeal exudate.  Eyes: Conjunctivae are normal. Pupils are equal, round, and reactive to light. No scleral icterus.  Neck: Normal range of motion. Neck supple.  Cardiovascular: Normal rate, regular rhythm and normal heart sounds.  Exam reveals no gallop and no  friction rub.   No murmur heard. Pulmonary/Chest: Effort normal and breath sounds normal. No respiratory distress. She has no wheezes. She has no rales. She exhibits no tenderness.  Abdominal: Soft. Bowel sounds are normal. She exhibits  no distension and no mass. There is tenderness. There is no rebound and no guarding.  Genitourinary: Uterus normal. Vaginal discharge found.  Thin white discharge, tenderness to palpation over right ovary  Musculoskeletal: Normal range of motion. She exhibits no edema and no tenderness.  Lymphadenopathy:    She has no cervical adenopathy.  Neurological: She is alert and oriented to person, place, and time. No cranial nerve deficit. She exhibits normal muscle tone. Coordination normal.  Skin: Skin is warm and dry. No rash noted. No erythema. No pallor.  Psychiatric: She has a normal mood and affect. Her behavior is normal. Judgment and thought content normal.    ED Course  Procedures (including critical care time) Labs Review Labs Reviewed  GC/CHLAMYDIA PROBE AMP  WET PREP, GENITAL  CBC WITH DIFFERENTIAL  URINALYSIS, ROUTINE W REFLEX MICROSCOPIC   Imaging Review No results found.  EKG Interpretation   None      Results for orders placed during the hospital encounter of 09/04/13  WET PREP, GENITAL      Result Value Range   Yeast Wet Prep HPF POC NONE SEEN  NONE SEEN   Trich, Wet Prep NONE SEEN  NONE SEEN   Clue Cells Wet Prep HPF POC MODERATE (*) NONE SEEN   WBC, Wet Prep HPF POC MANY (*) NONE SEEN  CBC WITH DIFFERENTIAL      Result Value Range   WBC 7.5  4.0 - 10.5 K/uL   RBC 4.26  3.87 - 5.11 MIL/uL   Hemoglobin 12.5  12.0 - 15.0 g/dL   HCT 16.1  09.6 - 04.5 %   MCV 88.0  78.0 - 100.0 fL   MCH 29.3  26.0 - 34.0 pg   MCHC 33.3  30.0 - 36.0 g/dL   RDW 40.9  81.1 - 91.4 %   Platelets 288  150 - 400 K/uL   Neutrophils Relative % 48  43 - 77 %   Neutro Abs 3.6  1.7 - 7.7 K/uL   Lymphocytes Relative 37  12 - 46 %   Lymphs Abs 2.8  0.7 - 4.0 K/uL   Monocytes Relative 12  3 - 12 %   Monocytes Absolute 0.9  0.1 - 1.0 K/uL   Eosinophils Relative 3  0 - 5 %   Eosinophils Absolute 0.2  0.0 - 0.7 K/uL   Basophils Relative 0  0 - 1 %   Basophils Absolute 0.0  0.0 - 0.1 K/uL   URINALYSIS, ROUTINE W REFLEX MICROSCOPIC      Result Value Range   Color, Urine YELLOW  YELLOW   APPearance HAZY (*) CLEAR   Specific Gravity, Urine 1.014  1.005 - 1.030   pH 7.5  5.0 - 8.0   Glucose, UA NEGATIVE  NEGATIVE mg/dL   Hgb urine dipstick NEGATIVE  NEGATIVE   Bilirubin Urine NEGATIVE  NEGATIVE   Ketones, ur NEGATIVE  NEGATIVE mg/dL   Protein, ur NEGATIVE  NEGATIVE mg/dL   Urobilinogen, UA 0.2  0.0 - 1.0 mg/dL   Nitrite NEGATIVE  NEGATIVE   Leukocytes, UA SMALL (*) NEGATIVE  URINE MICROSCOPIC-ADD ON      Result Value Range   Squamous Epithelial / LPF FEW (*) RARE   WBC, UA 0-2  <3 WBC/hpf  Bacteria, UA RARE  RARE  POCT PREGNANCY, URINE      Result Value Range   Preg Test, Ur NEGATIVE  NEGATIVE  POCT I-STAT, CHEM 8      Result Value Range   Sodium 143  135 - 145 mEq/L   Potassium 3.5  3.5 - 5.1 mEq/L   Chloride 104  96 - 112 mEq/L   BUN 6  6 - 23 mg/dL   Creatinine, Ser 1.61  0.50 - 1.10 mg/dL   Glucose, Bld 84  70 - 99 mg/dL   Calcium, Ion 0.96  0.45 - 1.23 mmol/L   TCO2 25  0 - 100 mmol/L   Hemoglobin 13.6  12.0 - 15.0 g/dL   HCT 40.9  81.1 - 91.4 %   US Transvaginal Non-ob  09/04/2013   CLINICAL DATA:  Pain.  EXAM: TRANSABDOMINAL ULTRASOUND OF PELVIS  TECHNIQUE: Transabdominal ultrasound examination of the pelvis was performed including evaluation of the uterus, ovaries, adnexal regions, and pelvic cul-de-sac. Doppler exam and Transvaginal ultrasound was also obtained.  COMPARISON:  None.  FINDINGS: Uterus  Measurements: 9.2 x 4.6 x 5.4 cm. No fibroids or other mass visualized.  Endometrium  Thickness: 9 mm.  No focal abnormality visualized.  Right ovary  Measurements: 3.3 x 1.7 x 2.4 cm. There is a 1.8 cm complex structure within the ovary, most likely an involuting cyst. No torsion.  Left ovary  Measurements: 2.9 x 1.8 x 2.3 cm. Normal appearance/no adnexal mass. No torsion.  Other findings:  Trace.  IMPRESSION: 1. 1.8 cm complex structure within the right ovary,  most likely an involuting cyst. Followup scan to demonstrate resolution can be performed. 2. Trace amount of free pelvic fluid .   Electronically Signed   By: Maisie Fus  Register   On: 09/04/2013 13:54     MDM  BV Right ovarian cyst.  Patient here with lower abdominal pain and thin white foul smelling vaginal discharge, was ttp over left ovary, no TOA, doubt PID, will treat with flagyl and pain medication.  Izola Price Marisue Humble, PA-C 09/04/13 1547

## 2013-09-04 NOTE — ED Notes (Signed)
Called ultrasound to find out about why complete pelvis is taking so long to result. They said they will try to figure out the issue.

## 2013-09-04 NOTE — ED Notes (Signed)
Pelvic cart set up at bedside  

## 2013-09-04 NOTE — ED Notes (Signed)
Pt returned from radiology.

## 2013-09-06 NOTE — ED Notes (Signed)
+  Chlamydia Chart sent to EDP office for review.  

## 2013-09-11 ENCOUNTER — Telehealth (HOSPITAL_COMMUNITY): Payer: Self-pay | Admitting: Emergency Medicine

## 2013-09-11 NOTE — ED Notes (Signed)
Chart returned from EDP office. Per Santiago Glad PA-C, Azithromycin 500 mg tablet. Sig: Take 2 tablets by mouth x 1. #2.

## 2013-09-14 ENCOUNTER — Encounter (HOSPITAL_COMMUNITY): Payer: Self-pay | Admitting: Emergency Medicine

## 2013-09-14 ENCOUNTER — Emergency Department (HOSPITAL_COMMUNITY)
Admission: EM | Admit: 2013-09-14 | Discharge: 2013-09-14 | Disposition: A | Discharge status: Home / Self Care | Payer: Medicaid Other | Attending: Emergency Medicine | Admitting: Emergency Medicine

## 2013-09-14 DIAGNOSIS — A749 Chlamydial infection, unspecified: Secondary | ICD-10-CM | POA: Insufficient documentation

## 2013-09-14 DIAGNOSIS — F17200 Nicotine dependence, unspecified, uncomplicated: Secondary | ICD-10-CM | POA: Insufficient documentation

## 2013-09-14 DIAGNOSIS — Z792 Long term (current) use of antibiotics: Secondary | ICD-10-CM | POA: Insufficient documentation

## 2013-09-14 DIAGNOSIS — N949 Unspecified condition associated with female genital organs and menstrual cycle: Secondary | ICD-10-CM | POA: Insufficient documentation

## 2013-09-14 MED ORDER — AZITHROMYCIN 250 MG PO TABS
1000.0000 mg | ORAL_TABLET | Freq: Once | ORAL | Status: AC
  Administered 2013-09-14: 1000 mg via ORAL
  Filled 2013-09-14: qty 4

## 2013-09-14 MED ORDER — IBUPROFEN 800 MG PO TABS
800.0000 mg | ORAL_TABLET | Freq: Once | ORAL | Status: AC
  Administered 2013-09-14: 800 mg via ORAL
  Filled 2013-09-14: qty 1

## 2013-09-14 NOTE — ED Notes (Signed)
Pt comfortable with d/c and f/u instructions. No prescriptions 

## 2013-09-14 NOTE — ED Notes (Signed)
Pt was called and told she has chlaymidia.  Pt came back today and wants to be checked.  She states there is no way she "has a STD"

## 2013-09-14 NOTE — ED Provider Notes (Signed)
CSN: [id number]     Arrival date & time 09/14/13  1039 History   First MD Initiated Contact with Patient 09/14/13 1056     Chief Complaint  Patient presents with  . SEXUALLY TRANSMITTED DISEASE   (Consider location/radiation/quality/duration/timing/severity/associated sxs/prior Treatment) HPI Comments: She presents to the emergency department after receiving a call with lab results showing a positive chlamydia infection on exam performed on 09/04/13. She wanted to be seen rather than have medications called into a pharmacy. She has no new symptoms, no vaginal discharge. She was diagnosed with an ovarian cyst on 09/04/13 and continues to have discomfort but it is unchanged.   The history is provided by the patient. No language interpreter was used.    Past Medical History  Diagnosis Date  . No pertinent past medical history    History reviewed. No pertinent past surgical history. History reviewed. No pertinent family history. History  Substance Use Topics  . Smoking status: Current Some Day Smoker -- 0.10 packs/day    Types: Cigarettes    Last Attempt to Quit: 12/09/2011  . Smokeless tobacco: Never Used  . Alcohol Use: No   OB History   Grav Para Term Preterm Abortions TAB SAB Ect Mult Living   2 2 2  0 0 0 0 0 0 2     Review of Systems  Constitutional: Negative for fever and chills.  HENT: Negative.   Respiratory: Negative.   Cardiovascular: Negative.   Gastrointestinal: Negative.   Genitourinary: Positive for pelvic pain.       See HPI.  Musculoskeletal: Negative.   Skin: Negative.   Neurological: Negative.     Allergies  Review of patient's allergies indicates no known allergies.  Home Medications   Current Outpatient Rx  Name  Route  Sig  Dispense  Refill  . metroNIDAZOLE (FLAGYL) 500 MG tablet   Oral   Take 1 tablet (500 mg total) by mouth 2 (two) times daily.   14 tablet   0    BP 141/80  Pulse 71  Temp(Src) 98.5 F (36.9 C) (Oral)  Resp 18  Ht 5'  3" (1.6 m)  Wt 219 lb 6.4 oz (99.519 kg)  BMI 38.87 kg/m2  SpO2 100%  LMP 09/11/2013 Physical Exam  Constitutional: She is oriented to person, place, and time. She appears well-developed and well-nourished.  Neck: Normal range of motion.  Pulmonary/Chest: Effort normal.  Abdominal: Soft. She exhibits no distension and no mass. There is no tenderness.  Neurological: She is alert and oriented to person, place, and time.  Skin: Skin is warm and dry.    ED Course  Procedures (including critical care time) Labs Review Labs Reviewed - No data to display Imaging Review No results found.  EKG Interpretation   None       MDM  No diagnosis found. 1. Chlamydia infection (test performed 11/10/4)  Zithromax given to treat chlamydia infection. She has follow up in place for GYN. All questions answered.     Arnoldo Hooker, PA-C 09/14/13 1212

## 2013-09-14 NOTE — ED Notes (Signed)
PA at bedside.

## 2013-09-14 NOTE — ED Provider Notes (Signed)
Medical screening examination/treatment/procedure(s) were performed by non-physician practitioner and as supervising physician I was immediately available for consultation/collaboration.    Celene Kras, MD 09/14/13 [phone]

## 2013-09-14 NOTE — ED Notes (Signed)
Pt reports being seen here on 11/10 for ovarian cyst, had pelvic done and was called and told that she was +chlaymadia. Denies any symptoms.

## 2013-09-29 ENCOUNTER — Encounter (HOSPITAL_COMMUNITY): Payer: Self-pay | Admitting: Emergency Medicine

## 2013-09-29 ENCOUNTER — Emergency Department (HOSPITAL_COMMUNITY)
Admission: EM | Admit: 2013-09-29 | Discharge: 2013-09-29 | Disposition: A | Discharge status: Home / Self Care | Payer: Medicaid Other | Attending: Emergency Medicine | Admitting: Emergency Medicine

## 2013-09-29 DIAGNOSIS — N6452 Nipple discharge: Secondary | ICD-10-CM

## 2013-09-29 DIAGNOSIS — N6459 Other signs and symptoms in breast: Secondary | ICD-10-CM | POA: Insufficient documentation

## 2013-09-29 DIAGNOSIS — Z3202 Encounter for pregnancy test, result negative: Secondary | ICD-10-CM | POA: Insufficient documentation

## 2013-09-29 DIAGNOSIS — R110 Nausea: Secondary | ICD-10-CM

## 2013-09-29 DIAGNOSIS — F17200 Nicotine dependence, unspecified, uncomplicated: Secondary | ICD-10-CM | POA: Insufficient documentation

## 2013-09-29 NOTE — ED Notes (Signed)
Per pt sts right breast is leaking. sts she thinks she is pregnant. sts she is having pregnancy symptoms.

## 2013-09-29 NOTE — ED Provider Notes (Signed)
CSN: [id number]     Arrival date & time 09/29/13  1321 History   First MD Initiated Contact with Patient 09/29/13 1349     Chief Complaint  Patient presents with  . Possible Pregnancy   (Consider location/radiation/quality/duration/timing/severity/associated sxs/prior Treatment) The history is provided by the patient and medical records.   This is a 27 year old female with no significant past medical history presenting to the ED for signs and symptoms concerning for possible pregnancy. Patient endorses right breast nipple discharge, nausea without vomiting, and slight headache for the past several days. Patient states she had similar symptoms and she was pregnant with her son. She's not currently on any form of birth control and has unprotected sex with 1 female partner on a daily basis. Was recently treated for STDs but denies any current urinary sx or vaginal complaints.  She is established with women[facility] outpatient clinic.  Past Medical History  Diagnosis Date  . No pertinent past medical history    History reviewed. No pertinent past surgical history. History reviewed. No pertinent family history. History  Substance Use Topics  . Smoking status: Current Some Day Smoker -- 0.10 packs/day    Types: Cigarettes    Last Attempt to Quit: 12/09/2011  . Smokeless tobacco: Never Used  . Alcohol Use: No   OB History   Grav Para Term Preterm Abortions TAB SAB Ect Mult Living   2 2 2  0 0 0 0 0 0 2     Review of Systems  Gastrointestinal: Positive for nausea.  Genitourinary:       Nipple discharge, breast tenderness  All other systems reviewed and are negative.    Allergies  Review of patient's allergies indicates no known allergies.  Home Medications   Current Outpatient Rx  Name  Route  Sig  Dispense  Refill  . metroNIDAZOLE (FLAGYL) 500 MG tablet   Oral   Take 1 tablet (500 mg total) by mouth 2 (two) times daily.   14 tablet   0    BP 122/61  Pulse 82  Temp(Src) 97.7 F  (36.5 C) (Oral)  Resp 20  Ht 5\' 3"  (1.6 m)  Wt 212 lb (96.163 kg)  BMI 37.56 kg/m2  SpO2 98%  LMP 09/11/2013  Physical Exam  Nursing note and vitals reviewed. Constitutional: She is oriented to person, place, and time. She appears well-developed and well-nourished.  HENT:  Head: Normocephalic and atraumatic.  Mouth/Throat: Oropharynx is clear and moist.  Eyes: Conjunctivae and EOM are normal. Pupils are equal, round, and reactive to light.  Neck: Normal range of motion.  Cardiovascular: Normal rate, regular rhythm and normal heart sounds.   Pulmonary/Chest: Effort normal and breath sounds normal. No respiratory distress. She has no wheezes.  Abdominal: Soft. Bowel sounds are normal. There is no tenderness. There is no guarding.  Genitourinary: There is breast tenderness. No breast swelling or bleeding.  Right breast TTP surrounding nipple with white crusting, no active discharge; no masses felt; skin cool and dry; no erythema, induration, or signs of breast infection/mastitis; left breast normal  Musculoskeletal: Normal range of motion. She exhibits no edema.  Neurological: She is alert and oriented to person, place, and time.  Skin: Skin is warm and dry.  Psychiatric: She has a normal mood and affect.    ED Course  Procedures (including critical care time) Labs Review Labs Reviewed  POCT PREGNANCY, URINE   Imaging Review No results found.  EKG Interpretation   None  MDM   1. Nipple discharge   2. Nausea    Urine pregnancy test negative. Patient without active nipple discharge on physical exam. She is established with women's outpatient clinic and will followup with them if symptoms persist.  Offered nausea medication, pt declined.  She will FU as previously discussed.  Garlon Hatchet, PA-C 09/29/13 1536

## 2013-09-29 NOTE — ED Provider Notes (Signed)
Medical screening examination/treatment/procedure(s) were performed by non-physician practitioner and as supervising physician I was immediately available for consultation/collaboration.  EKG Interpretation   None         Darlys Gales, MD 09/29/13 [place] [place]

## 2013-09-29 NOTE — ED Notes (Signed)
Verbal order give by brenda/rn to do a poct. Preg. -- results negative.

## 2013-10-09 ENCOUNTER — Emergency Department (HOSPITAL_COMMUNITY)
Admission: EM | Admit: 2013-10-09 | Discharge: 2013-10-09 | Disposition: A | Discharge status: Home / Self Care | Payer: Medicaid Other | Attending: Emergency Medicine | Admitting: Emergency Medicine

## 2013-10-09 ENCOUNTER — Encounter (HOSPITAL_COMMUNITY): Payer: Self-pay | Admitting: Emergency Medicine

## 2013-10-09 DIAGNOSIS — R51 Headache: Secondary | ICD-10-CM | POA: Insufficient documentation

## 2013-10-09 DIAGNOSIS — F17200 Nicotine dependence, unspecified, uncomplicated: Secondary | ICD-10-CM | POA: Insufficient documentation

## 2013-10-09 DIAGNOSIS — R110 Nausea: Secondary | ICD-10-CM | POA: Insufficient documentation

## 2013-10-09 DIAGNOSIS — Z3202 Encounter for pregnancy test, result negative: Secondary | ICD-10-CM | POA: Insufficient documentation

## 2013-10-09 LAB — URINALYSIS, ROUTINE W REFLEX MICROSCOPIC
Bilirubin Urine: NEGATIVE
Glucose, UA: NEGATIVE mg/dL
Hgb urine dipstick: NEGATIVE
Ketones, ur: NEGATIVE mg/dL
Nitrite: NEGATIVE
Specific Gravity, Urine: 1.021 (ref 1.005–1.030)
Urobilinogen, UA: 0.2 mg/dL (ref 0.0–1.0)

## 2013-10-09 LAB — POCT I-STAT, CHEM 8
BUN: 7 mg/dL (ref 6–23)
Calcium, Ion: 1.22 mmol/L (ref 1.12–1.23)
Chloride: 104 mEq/L (ref 96–112)
Glucose, Bld: 77 mg/dL (ref 70–99)
HCT: 44 % (ref 36.0–46.0)
Hemoglobin: 15 g/dL (ref 12.0–15.0)
Potassium: 3.9 mEq/L (ref 3.5–5.1)
TCO2: 24 mmol/L (ref 0–100)

## 2013-10-09 LAB — URINE MICROSCOPIC-ADD ON

## 2013-10-09 LAB — PREGNANCY, URINE: Preg Test, Ur: NEGATIVE

## 2013-10-09 MED ORDER — DIPHENHYDRAMINE HCL 50 MG/ML IJ SOLN
25.0000 mg | Freq: Once | INTRAMUSCULAR | Status: AC
  Administered 2013-10-09: 25 mg via INTRAVENOUS
  Filled 2013-10-09: qty 1

## 2013-10-09 MED ORDER — METOCLOPRAMIDE HCL 5 MG/ML IJ SOLN
10.0000 mg | Freq: Once | INTRAMUSCULAR | Status: AC
  Administered 2013-10-09: 10 mg via INTRAVENOUS
  Filled 2013-10-09: qty 2

## 2013-10-09 MED ORDER — SODIUM CHLORIDE 0.9 % IV SOLN
INTRAVENOUS | Status: DC
  Administered 2013-10-09: 13:00:00 via INTRAVENOUS

## 2013-10-09 MED ORDER — SODIUM CHLORIDE 0.9 % IV BOLUS (SEPSIS)
1000.0000 mL | Freq: Once | INTRAVENOUS | Status: AC
  Administered 2013-10-09: 1000 mL via INTRAVENOUS

## 2013-10-09 NOTE — ED Notes (Signed)
Per EMS-pt c/o of nausea, headache x2 days. Thinks she may be pregnant. Seen at [facility] for same. Denies fever, chest pain.

## 2013-10-09 NOTE — ED Provider Notes (Signed)
CSN: [id number]     Arrival date & time 10/09/13  1203 History   First MD Initiated Contact with Patient 10/09/13 1229     Chief Complaint  Patient presents with  . Nausea  . Headache   (Consider location/radiation/quality/duration/timing/severity/associated sxs/prior Treatment) Patient is a 27 y.o. female presenting with headaches. The history is provided by the patient.  Headache  patient here complaining of right temporal headache x2 days with associated nausea. Denies any fever or photophobia. Denies any neck pain. Has had what she describes to be morning sickness with associated breast tenderness. Was seen at the hospital 2 days ago for similar symptoms and had a negative pregnancy test. She denies any vaginal bleeding or discharge. No syncope or near-syncope. No chest pain chest pressure. Symptoms are persistent and no treatment used prior to arrival  Past Medical History  Diagnosis Date  . No pertinent past medical history    History reviewed. No pertinent past surgical history. No family history on file. History  Substance Use Topics  . Smoking status: Current Some Day Smoker -- 0.10 packs/day    Types: Cigarettes    Last Attempt to Quit: 12/09/2011  . Smokeless tobacco: Never Used  . Alcohol Use: No   OB History   Grav Para Term Preterm Abortions TAB SAB Ect Mult Living   2 2 2  0 0 0 0 0 0 2     Review of Systems  Neurological: Positive for headaches.  All other systems reviewed and are negative.    Allergies  Review of patient's allergies indicates no known allergies.  Home Medications  No current outpatient prescriptions on file. BP 146/76  Pulse 99  Temp(Src) 98.9 F (37.2 C) (Oral)  Resp 20  SpO2 100%  LMP 09/11/2013 Physical Exam  Nursing note and vitals reviewed. Constitutional: She is oriented to person, place, and time. She appears well-developed and well-nourished.  Non-toxic appearance. No distress.  HENT:  Head: Normocephalic and atraumatic.   Eyes: Conjunctivae, EOM and lids are normal. Pupils are equal, round, and reactive to light.  Neck: Normal range of motion. Neck supple. No tracheal deviation present. No mass present.  Cardiovascular: Normal rate, regular rhythm and normal heart sounds.  Exam reveals no gallop.   No murmur heard. Pulmonary/Chest: Effort normal and breath sounds normal. No stridor. No respiratory distress. She has no decreased breath sounds. She has no wheezes. She has no rhonchi. She has no rales.  Abdominal: Soft. Normal appearance and bowel sounds are normal. She exhibits no distension. There is no tenderness. There is no rebound and no CVA tenderness.  Musculoskeletal: Normal range of motion. She exhibits no edema and no tenderness.  Neurological: She is alert and oriented to person, place, and time. She has normal strength. No cranial nerve deficit or sensory deficit. GCS eye subscore is 4. GCS verbal subscore is 5. GCS motor subscore is 6.  Skin: Skin is warm and dry. No abrasion and no rash noted.  Psychiatric: She has a normal mood and affect. Her speech is normal and behavior is normal.    ED Course  Procedures (including critical care time) Labs Review Labs Reviewed - No data to display Imaging Review No results found.  EKG Interpretation   None       MDM  No diagnosis found.  Patient treated for her headache and feels better. Repeat neurological exam at time of discharge stable  Toy Baker, MD 10/09/13 [phone]

## 2013-10-09 NOTE — ED Notes (Signed)
Pt. Is unable to use the restroom at this time, but is aware that we need a specimen. Urine cup at bedside.

## 2013-10-09 NOTE — ED Notes (Signed)
Bed: AV40 Expected date: 10/09/13 Expected time: 11:55 AM Means of arrival:  Comments: ems -n/v female

## 2013-10-10 LAB — URINE CULTURE: Colony Count: 70000

## 2013-11-02 ENCOUNTER — Emergency Department (HOSPITAL_COMMUNITY)
Admission: EM | Admit: 2013-11-02 | Discharge: 2013-11-02 | Disposition: A | Discharge status: Home / Self Care | Payer: Medicaid Other | Attending: Emergency Medicine | Admitting: Emergency Medicine

## 2013-11-02 ENCOUNTER — Encounter (HOSPITAL_COMMUNITY): Payer: Self-pay | Admitting: Emergency Medicine

## 2013-11-02 DIAGNOSIS — A084 Viral intestinal infection, unspecified: Secondary | ICD-10-CM

## 2013-11-02 DIAGNOSIS — R110 Nausea: Secondary | ICD-10-CM | POA: Insufficient documentation

## 2013-11-02 DIAGNOSIS — A088 Other specified intestinal infections: Secondary | ICD-10-CM | POA: Insufficient documentation

## 2013-11-02 DIAGNOSIS — Z3202 Encounter for pregnancy test, result negative: Secondary | ICD-10-CM | POA: Insufficient documentation

## 2013-11-02 DIAGNOSIS — R109 Unspecified abdominal pain: Secondary | ICD-10-CM | POA: Insufficient documentation

## 2013-11-02 DIAGNOSIS — F17200 Nicotine dependence, unspecified, uncomplicated: Secondary | ICD-10-CM | POA: Insufficient documentation

## 2013-11-02 LAB — CBC WITH DIFFERENTIAL/PLATELET
BASOS ABS: 0 10*3/uL (ref 0.0–0.1)
BASOS PCT: 0 % (ref 0–1)
Eosinophils Absolute: 0.2 10*3/uL (ref 0.0–0.7)
Eosinophils Relative: 2 % (ref 0–5)
HEMATOCRIT: 40.9 % (ref 36.0–46.0)
HEMOGLOBIN: 13.5 g/dL (ref 12.0–15.0)
Lymphocytes Relative: 21 % (ref 12–46)
Lymphs Abs: 2.8 10*3/uL (ref 0.7–4.0)
MCH: 29 pg (ref 26.0–34.0)
MCHC: 33 g/dL (ref 30.0–36.0)
MCV: 88 fL (ref 78.0–100.0)
MONO ABS: 1.1 10*3/uL — AB (ref 0.1–1.0)
Monocytes Relative: 8 % (ref 3–12)
NEUTROS ABS: 9.1 10*3/uL — AB (ref 1.7–7.7)
NEUTROS PCT: 69 % (ref 43–77)
Platelets: 331 10*3/uL (ref 150–400)
RBC: 4.65 MIL/uL (ref 3.87–5.11)
RDW: 14.5 % (ref 11.5–15.5)
WBC: 13.2 10*3/uL — ABNORMAL HIGH (ref 4.0–10.5)

## 2013-11-02 LAB — URINE MICROSCOPIC-ADD ON

## 2013-11-02 LAB — COMPREHENSIVE METABOLIC PANEL
ALBUMIN: 3.8 g/dL (ref 3.5–5.2)
ALT: 18 U/L (ref 0–35)
AST: 17 U/L (ref 0–37)
Alkaline Phosphatase: 91 U/L (ref 39–117)
BUN: 7 mg/dL (ref 6–23)
CALCIUM: 9.2 mg/dL (ref 8.4–10.5)
CO2: 23 mEq/L (ref 19–32)
Chloride: 100 mEq/L (ref 96–112)
Creatinine, Ser: 0.71 mg/dL (ref 0.50–1.10)
GFR calc Af Amer: >90 mL/min (ref >90)
GFR calc non Af Amer: >90 mL/min (ref >90)
Glucose, Bld: 85 mg/dL (ref 70–99)
Potassium: 4.1 mEq/L (ref 3.7–5.3)
Sodium: 138 mEq/L (ref 137–147)
Total Bilirubin: 0.2 mg/dL — ABNORMAL LOW (ref 0.3–1.2)
Total Protein: 7.8 g/dL (ref 6.0–8.3)

## 2013-11-02 LAB — URINALYSIS, ROUTINE W REFLEX MICROSCOPIC
BILIRUBIN URINE: NEGATIVE
Glucose, UA: NEGATIVE mg/dL
HGB URINE DIPSTICK: NEGATIVE
Ketones, ur: NEGATIVE mg/dL
Nitrite: NEGATIVE
Protein, ur: NEGATIVE mg/dL
Specific Gravity, Urine: 1.018 (ref 1.005–1.030)
Urobilinogen, UA: 0.2 mg/dL (ref 0.0–1.0)
pH: 5 (ref 5.0–8.0)

## 2013-11-02 LAB — LIPASE, BLOOD: Lipase: 19 U/L (ref 11–59)

## 2013-11-02 LAB — POCT PREGNANCY, URINE: Preg Test, Ur: NEGATIVE

## 2013-11-02 MED ORDER — SODIUM CHLORIDE 0.9 % IV BOLUS (SEPSIS)
1000.0000 mL | Freq: Once | INTRAVENOUS | Status: AC
  Administered 2013-11-02: 1000 mL via INTRAVENOUS

## 2013-11-02 MED ORDER — DIPHENOXYLATE-ATROPINE 2.5-0.025 MG PO TABS
1.0000 | ORAL_TABLET | Freq: Once | ORAL | Status: AC
  Administered 2013-11-02: 1 via ORAL
  Filled 2013-11-02: qty 1

## 2013-11-02 MED ORDER — ONDANSETRON 8 MG PO TBDP: 8.0000 mg | ORAL_TABLET | Freq: Three times a day (TID) | ORAL | Status: DC | PRN

## 2013-11-02 MED ORDER — ONDANSETRON HCL 4 MG/2ML IJ SOLN
4.0000 mg | Freq: Once | INTRAMUSCULAR | Status: AC
  Administered 2013-11-02: 4 mg via INTRAVENOUS
  Filled 2013-11-02: qty 2

## 2013-11-02 NOTE — ED Notes (Signed)
Bed: WLPT3 Expected date:  Expected time:  Means of arrival:  Comments: EMS 

## 2013-11-02 NOTE — ED Notes (Signed)
Per EMS-states diarrhea since 8 am-states rectum hurts-central abdominal pain-no period in a month-no vomiting

## 2013-11-02 NOTE — ED Provider Notes (Signed)
CSN: [id number]     Arrival date & time 11/02/13  1031 History   First MD Initiated Contact with Patient 11/02/13 1209     Chief Complaint  Patient presents with  . Diarrhea   (Consider location/radiation/quality/duration/timing/severity/associated sxs/prior Treatment) HPI Carla Carson is a 28 y.o. female  emergency department complaining of abdominal pain, diarrhea, nausea. Patient states her symptoms began this morning when she woke up. She states she did not eat or drink anything because she was nauseated. She states she has had 10-20 episodes of watery diarrhea. She states her abdominal pain is worse right before having a bowel movement. She denies vomiting but admits to nausea. She denies any fever. She denies any recent sick contacts. She denies any chills. She denies any back pain or urinary symptoms. She did not take anything prior to coming in.     Past Medical History  Diagnosis Date  . No pertinent past medical history    History reviewed. No pertinent past surgical history. No family history on file. History  Substance Use Topics  . Smoking status: Current Some Day Smoker -- 0.10 packs/day    Types: Cigarettes    Last Attempt to Quit: 12/09/2011  . Smokeless tobacco: Never Used  . Alcohol Use: No   OB History   Grav Para Term Preterm Abortions TAB SAB Ect Mult Living   2 2 2  0 0 0 0 0 0 2     Review of Systems  Constitutional: Negative for fever and chills.  Respiratory: Negative for cough, chest tightness and shortness of breath.   Cardiovascular: Negative for chest pain, palpitations and leg swelling.  Gastrointestinal: Positive for nausea, abdominal pain and diarrhea. Negative for vomiting and blood in stool.  Genitourinary: Negative for dysuria, hematuria, flank pain, vaginal bleeding, vaginal discharge, vaginal pain and pelvic pain.  Musculoskeletal: Negative for arthralgias, myalgias, neck pain and neck stiffness.  Skin: Negative for rash.  Neurological:  Negative for dizziness, weakness and headaches.  All other systems reviewed and are negative.    Allergies  Review of patient's allergies indicates no known allergies.  Home Medications  No current outpatient prescriptions on file. BP 125/83  Pulse 77  Temp(Src) 98.3 F (36.8 C) (Oral)  Resp 16  SpO2 100%  LMP 10/11/2013 Physical Exam  Nursing note and vitals reviewed. Constitutional: She appears well-developed and well-nourished. No distress.  HENT:  Head: Normocephalic.  Eyes: Conjunctivae are normal.  Neck: Neck supple.  Cardiovascular: Normal rate, regular rhythm and normal heart sounds.   Pulmonary/Chest: Effort normal and breath sounds normal. No respiratory distress. She has no wheezes. She has no rales.  Abdominal: Soft. Bowel sounds are normal. She exhibits no distension. There is no tenderness. There is no rebound.  Musculoskeletal: She exhibits no edema.  Neurological: She is alert.  Skin: Skin is warm and dry.  Psychiatric: She has a normal mood and affect. Her behavior is normal.    ED Course  Procedures (including critical care time) Labs Review Labs Reviewed  URINALYSIS, ROUTINE W REFLEX MICROSCOPIC - Abnormal; Notable for the following:    APPearance CLOUDY (*)    Leukocytes, UA MODERATE (*)    All other components within normal limits  URINE MICROSCOPIC-ADD ON - Abnormal; Notable for the following:    Squamous Epithelial / LPF MANY (*)    Bacteria, UA FEW (*)    All other components within normal limits  CBC WITH DIFFERENTIAL - Abnormal; Notable for the following:    WBC 13.2 (*)  Neutro Abs 9.1 (*)    Monocytes Absolute 1.1 (*)    All other components within normal limits  COMPREHENSIVE METABOLIC PANEL - Abnormal; Notable for the following:    Total Bilirubin 0.2 (*)    All other components within normal limits  URINE CULTURE  LIPASE, BLOOD  POCT PREGNANCY, URINE   Imaging Review No results found.  EKG Interpretation   None        MDM   1. Viral gastroenteritis     PT uncomfortable appearing. Reports multiple episodes of watery diarrhea this morning. No vomiting. Admits to nausea. Pt is afebrile. Will start iv fluids. zofran ordered. Lomotil ordered. Labs pending.     2:42 PM Pt feeling much better. She is drinking fluids, tolerating PO crackers. I suspect given exam findings and labs, pt most likely has viral gastroenteritis. No acute abdomen. Stable.  Will d/c home  Filed Vitals:   11/02/13 1035  BP: 125/83  Pulse: 77  Temp: 98.3 F (36.8 C)  TempSrc: Oral  Resp: 16  SpO2: 100%     Rayne Cowdrey A [place] Winslow, PA-C 11/02/13 1612

## 2013-11-02 NOTE — Discharge Instructions (Signed)
Take Zofran as prescribed as needed for nausea. Drink plenty of fluids at home. Followup with a primary care doctor for recheck as needed. Return if your symptoms are worsening.   Viral Gastroenteritis Viral gastroenteritis is also known as stomach flu. This condition affects the stomach and intestinal tract. It can cause sudden diarrhea and vomiting. The illness typically lasts 3 to 8 days. Most people develop an immune response that eventually gets rid of the virus. While this natural response develops, the virus can make you quite ill. CAUSES  Many different viruses can cause gastroenteritis, such as rotavirus or noroviruses. You can catch one of these viruses by consuming contaminated food or water. You may also catch a virus by sharing utensils or other personal items with an infected person or by touching a contaminated surface. SYMPTOMS  The most common symptoms are diarrhea and vomiting. These problems can cause a severe loss of body fluids (dehydration) and a body salt (electrolyte) imbalance. Other symptoms may include:  Fever.  Headache.  Fatigue.  Abdominal pain. DIAGNOSIS  Your caregiver can usually diagnose viral gastroenteritis based on your symptoms and a physical exam. A stool sample may also be taken to test for the presence of viruses or other infections. TREATMENT  This illness typically goes away on its own. Treatments are aimed at rehydration. The most serious cases of viral gastroenteritis involve vomiting so severely that you are not able to keep fluids down. In these cases, fluids must be given through an intravenous line (IV). HOME CARE INSTRUCTIONS   Drink enough fluids to keep your urine clear or pale yellow. Drink small amounts of fluids frequently and increase the amounts as tolerated.  Ask your caregiver for specific rehydration instructions.  Avoid:  Foods high in sugar.  Alcohol.  Carbonated drinks.  Tobacco.  Juice.  Caffeine  drinks.  Extremely hot or cold fluids.  Fatty, greasy foods.  Too much intake of anything at one time.  Dairy products until 24 to 48 hours after diarrhea stops.  You may consume probiotics. Probiotics are active cultures of beneficial bacteria. They may lessen the amount and number of diarrheal stools in adults. Probiotics can be found in yogurt with active cultures and in supplements.  Wash your hands well to avoid spreading the virus.  Only take over-the-counter or prescription medicines for pain, discomfort, or fever as directed by your caregiver. Do not give aspirin to children. Antidiarrheal medicines are not recommended.  Ask your caregiver if you should continue to take your regular prescribed and over-the-counter medicines.  Keep all follow-up appointments as directed by your caregiver. SEEK IMMEDIATE MEDICAL CARE IF:   You are unable to keep fluids down.  You do not urinate at least once every 6 to 8 hours.  You develop shortness of breath.  You notice blood in your stool or vomit. This may look like coffee grounds.  You have abdominal pain that increases or is concentrated in one small area (localized).  You have persistent vomiting or diarrhea.  You have a fever.  The patient is a child younger than 3 months, and he or she has a fever.  The patient is a child older than 3 months, and he or she has a fever and persistent symptoms.  The patient is a child older than 3 months, and he or she has a fever and symptoms suddenly get worse.  The patient is a baby, and he or she has no tears when crying. MAKE SURE YOU:  Understand these instructions.  Will watch your condition.  Will get help right away if you are not doing well or get worse. Document Released: 10/12/2005 Document Revised: 01/04/2012 Document Reviewed: 07/29/2011 [facility] Patient Information 2014 [place].

## 2013-11-03 LAB — URINE CULTURE: Colony Count: 70000

## 2013-11-03 NOTE — ED Provider Notes (Signed)
Medical screening examination/treatment/procedure(s) were performed by non-physician practitioner and as supervising physician I was immediately available for consultation/collaboration.  EKG Interpretation   None        Emmanuela Ghazi R. Jolynne Spurgin, MD 11/03/13 0709 

## 2014-02-15 ENCOUNTER — Encounter (HOSPITAL_COMMUNITY): Payer: Self-pay | Admitting: General Practice

## 2014-02-15 ENCOUNTER — Inpatient Hospital Stay (HOSPITAL_COMMUNITY)
Admission: AD | Admit: 2014-02-15 | Discharge: 2014-02-15 | Disposition: A | Discharge status: Home / Self Care | Payer: Medicaid Other | Source: Ambulatory Visit | Attending: Obstetrics & Gynecology | Admitting: Obstetrics & Gynecology

## 2014-02-15 DIAGNOSIS — N911 Secondary amenorrhea: Secondary | ICD-10-CM

## 2014-02-15 DIAGNOSIS — R609 Edema, unspecified: Secondary | ICD-10-CM | POA: Insufficient documentation

## 2014-02-15 DIAGNOSIS — N912 Amenorrhea, unspecified: Secondary | ICD-10-CM

## 2014-02-15 LAB — URINALYSIS, ROUTINE W REFLEX MICROSCOPIC
Bilirubin Urine: NEGATIVE
GLUCOSE, UA: NEGATIVE mg/dL
Hgb urine dipstick: NEGATIVE
KETONES UR: NEGATIVE mg/dL
LEUKOCYTES UA: NEGATIVE
Nitrite: NEGATIVE
PH: 6 (ref 5.0–8.0)
Protein, ur: NEGATIVE mg/dL
SPECIFIC GRAVITY, URINE: 1.025 (ref 1.005–1.030)
Urobilinogen, UA: 0.2 mg/dL (ref 0.0–1.0)

## 2014-02-15 LAB — POCT PREGNANCY, URINE: PREG TEST UR: NEGATIVE

## 2014-02-15 NOTE — MAU Provider Note (Signed)
History     CSN: [id number]  Arrival date and time: 02/15/14 1101   First Provider Initiated Contact with Patient 02/15/14 1120      Chief Complaint  Patient presents with  . Possible Pregnancy  . Foot Swelling   HPI This is a 28 y.o. female who presents requesting pregnancy test.  States she missed a period and her feet are swollen, so thinks she is pregnant.   RN Note:  Feet are swollen, missed a period, has been gaining weight.       OB History   Grav Para Term Preterm Abortions TAB SAB Ect Mult Living   2 2 2  0 0 0 0 0 0 2      Past Medical History  Diagnosis Date  . No pertinent past medical history     History reviewed. No pertinent past surgical history.  History reviewed. No pertinent family history.  History  Substance Use Topics  . Smoking status: Current Some Day Smoker -- 1.00 packs/day    Types: Cigarettes  . Smokeless tobacco: Never Used  . Alcohol Use: No    Allergies: No Known Allergies  Prescriptions prior to admission  Medication Sig Dispense Refill  . ondansetron (ZOFRAN ODT) 8 MG disintegrating tablet Take 1 tablet (8 mg total) by mouth every 8 (eight) hours as needed for nausea or vomiting.  10 tablet  0    Review of Systems  Constitutional: Negative for fever, chills and malaise/fatigue.  Cardiovascular: Positive for leg swelling (states feet are swollen).  Gastrointestinal: Negative for nausea, vomiting and abdominal pain.  Neurological: Negative for dizziness and headaches.   Physical Exam   Blood pressure 131/75, pulse 81, temperature 98.7 F (37.1 C), temperature source Oral, resp. rate 18, height 5' 1.5" (1.562 m), weight 104.781 kg (231 lb), last menstrual period 01/09/2014.  Physical Exam  Constitutional: She is oriented to person, place, and time. She appears well-developed and well-nourished. No distress.  HENT:  Head: Normocephalic.  Cardiovascular: Normal rate.   Respiratory: Effort normal.  GI: Soft. There is no  tenderness. There is no rebound and no guarding.  Musculoskeletal: Normal range of motion.  Neurological: She is alert and oriented to person, place, and time.  Skin: Skin is warm and dry.  Psychiatric: She has a normal mood and affect.  Has trace edema of dorsum of feet  MAU Course  Procedures  MDM Results for orders placed during the hospital encounter of 02/15/14 (from the past 24 hour(s))  URINALYSIS, ROUTINE W REFLEX MICROSCOPIC     Status: None   Collection Time    02/15/14 11:15 AM      Result Value Ref Range   Color, Urine YELLOW  YELLOW   APPearance CLEAR  CLEAR   Specific Gravity, Urine 1.025  1.005 - 1.030   pH 6.0  5.0 - 8.0   Glucose, UA NEGATIVE  NEGATIVE mg/dL   Hgb urine dipstick NEGATIVE  NEGATIVE   Bilirubin Urine NEGATIVE  NEGATIVE   Ketones, ur NEGATIVE  NEGATIVE mg/dL   Protein, ur NEGATIVE  NEGATIVE mg/dL   Urobilinogen, UA 0.2  0.0 - 1.0 mg/dL   Nitrite NEGATIVE  NEGATIVE   Leukocytes, UA NEGATIVE  NEGATIVE  POCT PREGNANCY, URINE     Status: None   Collection Time    02/15/14 11:21 AM      Result Value Ref Range   Preg Test, Ur NEGATIVE  NEGATIVE     Assessment and Plan  A:  Missed period (  6 days late)      Feet swollen      Negative pregnancy test  P:  DIscussed findings       Discharge home  Aviva SignsMarie L Williams 02/15/2014, 11:44 AM

## 2014-02-15 NOTE — Discharge Instructions (Signed)
Menstruation Menstruation is the monthly passing of blood, tissue, fluid and mucus, also know as a period. Your body is shedding the lining of the uterus. The flow, or amount of blood, usually lasts from 3 7 days each month. Hormones control the menstrual cycle. Hormones are a chemical substance produced by endocrine glands in the body to regulate different bodily functions. The first menstrual period may start any time between age 26 years to 16 years. However, it usually starts around age 12 years. Some girls have regular monthly menstrual cycles right from the beginning. However, it is not unusual to have only a couple of drops of blood or spotting when you first start menstruating. It is also not unusual to have two periods a month or miss a month or two when first starting your periods. SYMPTOMS   Mild to moderate abdominal cramps.  Aching or pain in the lower back area. Symptoms may occur 5 10 days before your menstrual period starts. These symptoms are referred to as premenstrual syndrome (PMS). These symptoms can include:  Headache.  Breast tenderness and swelling.  Bloating.  Tiredness (fatigue).  Mood changes.  Craving for certain foods. These are normal signs and symptoms and can vary in severity. To help relieve these problems, ask your caregiver if you can take over-the-counter medications for pain or discomfort. If the symptoms are not controllable, see your caregiver for help.  HORMONES INVOLVED IN MENSTRUATION Menstruation comes about because of hormones produced by the pituitary gland in the brain and the ovaries that affect the uterine lining. First, the pituitary gland in the brain produces the hormone follicle stimulating hormone (FSH). FSH stimulates the ovaries to produce estrogen, which thickens the uterine lining and begins to develop an egg in the ovary. About 14 days later, the pituitary gland produces another hormone called luteinizing hormone (LH). LH causes the egg  to come out of a sac in the ovary (ovulation). The empty sac on the ovary called the corpus luteum is stimulated by another hormone from the pituitary gland called luteotropin. The corpus luteum begins to produce the estrogen and progesterone hormone. The progesterone hormone prepares the lining of the uterus to have the fertilized egg (egg combined with sperm) attach to the lining of the uterus and begin to develop into a fetus. If the egg is not fertilized, the corpus luteum stops producing estrogen and progesterone, it disappears, the lining of the uterus sloughs off and a menstrual period begins. Then the menstrual cycle starts all over again and will continue monthly unless pregnancy occurs or menopause begins. The secretion of hormones is complex. Various parts of the body become involved in many chemical activities. Female sex hormones have other functions in a woman's body as well. Estrogen increases a woman's sex drive (libido). It naturally helps body get rid of fluids (diuretic). It also aids in the process of building new bone. Therefore, maintaining hormonal health is essential to all levels of a woman's well being. These hormones are usually present in normal amounts and cause you to menstruate. It is the relationship between the (small) levels of the hormones that is critical. When the balance is upset, menstrual irregularities can occur. HOW DOES THE MENSTRUAL CYCLE HAPPEN?  Menstrual cycles vary in length from 21 35 days with an average of 29 days. The cycle begins on the first day of bleeding. At this time, the pituitary gland in the brain releases FSH that travels through the bloodstream to the ovaries. The FSH stimulates the   follicles in the ovaries. This prepares the body for ovulation that occurs around the 14th day of the cycle. The ovaries produce estrogen, and this makes sure conditions are right in the uterus for implantation of the fertilized egg.  When the levels of estrogen reach a  high enough level, it signals the gland in the brain (pituitary gland) to release a surge of LH. This causes the release of the ripest egg from its follicle (ovulation). Usually only one follicle releases one egg, but sometimes more than one follicle releases an egg especially when stimulating the ovaries for in vitro fertilization. The egg can then be collected by either fallopian tube to await fertilization. The burst follicle within the ovary that is left behind is now called the corpus luteum or "yellow body." The corpus luteum continues to give off (secrete) reduced amounts of estrogen. This closes and hardens the cervix. It dries up the mucus to the naturally infertile condition.  The corpus luteum also begins to give off greater amounts of progesterone. This causes the lining of the uterus (endometrium) to thicken even more in preparation for the fertilized egg. The egg is starting to journey down from the fallopian tube to the uterus. It also signals the ovaries to stop releasing eggs. It assists in returning the cervical mucus to its infertile state.  If the egg implants successfully into the womb lining and pregnancy occurs, progesterone levels will continue to raise. It is often this hormone that gives some pregnant women a feeling of well being, like a "natural high." Progesterone levels drop again after childbirth.  If fertilization does not occur, the corpus luteum dies, stopping the production of hormones. This sudden drop in progesterone causes the uterine lining to break down, accompanied by blood (menstruation).  This starts the cycle back at day 1. The whole process starts all over again. Woman go through this cycle every month from puberty to menopause. Women have breaks only for pregnancy and breastfeeding (lactation), unless the woman has health problems that affect the female hormone system or chooses to use oral contraceptives to have unnatural menstrual periods. HOME CARE  INSTRUCTIONS   Keep track of your periods by using a calendar.  If you use tampons, get the least absorbent to avoid toxic shock syndrome.  Do not leave tampons in the vagina over night or longer than 6 hours.  Wear a sanitary pad over night.  Exercise 3 5 times a week or more.  Avoid foods and drinks that you know will make your symptoms worse before or during your period. SEEK MEDICAL CARE IF:   You develop a fever with your period.  Your periods are lasting more than 7 days.  Your period is so heavy that you have to change pads or tampons every 30 minutes.  You develop clots with your period and never had clots before.  You cannot get relief from over-the-counter medication for your symptoms.  Your period has not started, and it has been longer than 35 days. Document Released: 10/02/2002 Document Revised: 08/02/2013 Document Reviewed: 05/11/2013 [facility] Patient Information 2014 [place], [place].  Secondary Amenorrhea  Secondary amenorrhea is the stopping of menstrual flow for 3 6 months in a female who has previously had periods. There are many possible causes. Most of these causes are not serious. Usually, treating the underlying problem causing the loss of menses will return your periods to normal. CAUSES  Some common and uncommon causes of not menstruating include:  Malnutrition.  Low blood sugar (  hypoglycemia).  Polycystic ovary disease.  Stress or fear.  Breastfeeding.  Hormone imbalance.  Ovarian failure.  Medicines.  Extreme obesity.  Cystic fibrosis.  Low body weight or drastic weight reduction from any cause.  Early menopause.  Removal of ovaries or uterus.  Contraceptives.  Illness.  Long-term (chronic) illnesses.  Cushing syndrome.  Thyroid problems.  Birth control pills, patches, or vaginal rings for birth control. RISK FACTORS You may be at greater risk of secondary amenorrhea if:  You have a family history of this  condition.  You have an eating disorder.  You do athletic training. DIAGNOSIS  A diagnosis is made by your health care provider taking a medical history and doing a physical exam. This will include a pelvic exam to check for problems with your reproductive organs. Pregnancy must be ruled out. Often, numerous blood tests are done to measure different hormones in the body. Urine testing may be done. Specialized exams (ultrasound, CT scan, MRI, or hysteroscopy) may have to be done as well as measuring the body mass index (BMI). TREATMENT  Treatment depends on the cause of the amenorrhea. If an eating disorder is present, this can be treated with an adequate diet and therapy. Chronic illnesses may improve with treatment of the illness. Amenorrhea may be corrected with medicines, lifestyle changes, or surgery. If the amenorrhea cannot be corrected, it is sometimes possible to create a false menstruation with medicines. HOME CARE INSTRUCTIONS  Maintain a healthy diet.  Manage weight problems.  Exercise regularly but not excessively.  Get adequate sleep.  Manage stress.  Be aware of changes in your menstrual cycle. Keep a record of when your periods occur. Note the date your period starts, how long it lasts, and any problems. SEEK MEDICAL CARE IF: Your symptoms do not get better with treatment. Document Released: 11/23/2006 Document Revised: 06/14/2013 Document Reviewed: 03/30/2013 [facility] Patient Information 2014 [place], [place].

## 2014-02-15 NOTE — MAU Note (Signed)
Feet are swollen, missed a period, has been gaining weight.

## 2014-08-27 ENCOUNTER — Encounter (HOSPITAL_COMMUNITY): Payer: Self-pay | Admitting: General Practice

## 2015-02-09 ENCOUNTER — Inpatient Hospital Stay (HOSPITAL_COMMUNITY)
Admission: AD | Admit: 2015-02-09 | Discharge: 2015-02-09 | Disposition: A | Discharge status: Home / Self Care | Payer: Medicaid Other | Source: Ambulatory Visit | Attending: Obstetrics & Gynecology | Admitting: Obstetrics & Gynecology

## 2015-02-09 ENCOUNTER — Encounter (HOSPITAL_COMMUNITY): Payer: Self-pay | Admitting: *Deleted

## 2015-02-09 ENCOUNTER — Inpatient Hospital Stay (HOSPITAL_COMMUNITY): Payer: Medicaid Other

## 2015-02-09 DIAGNOSIS — Z3202 Encounter for pregnancy test, result negative: Secondary | ICD-10-CM | POA: Insufficient documentation

## 2015-02-09 DIAGNOSIS — F17210 Nicotine dependence, cigarettes, uncomplicated: Secondary | ICD-10-CM | POA: Insufficient documentation

## 2015-02-09 DIAGNOSIS — R102 Pelvic and perineal pain: Secondary | ICD-10-CM

## 2015-02-09 DIAGNOSIS — N730 Acute parametritis and pelvic cellulitis: Secondary | ICD-10-CM | POA: Insufficient documentation

## 2015-02-09 LAB — URINALYSIS, ROUTINE W REFLEX MICROSCOPIC
Bilirubin Urine: NEGATIVE
Glucose, UA: NEGATIVE mg/dL
Hgb urine dipstick: NEGATIVE
Ketones, ur: NEGATIVE mg/dL
Nitrite: NEGATIVE
Protein, ur: NEGATIVE mg/dL
Specific Gravity, Urine: >1.03 — ABNORMAL HIGH (ref 1.005–1.030)
UROBILINOGEN UA: 0.2 mg/dL (ref 0.0–1.0)
pH: 6 (ref 5.0–8.0)

## 2015-02-09 LAB — CBC
HCT: 37 % (ref 36.0–46.0)
HEMOGLOBIN: 11.9 g/dL — AB (ref 12.0–15.0)
MCH: 27.7 pg (ref 26.0–34.0)
MCHC: 32.2 g/dL (ref 30.0–36.0)
MCV: 86.2 fL (ref 78.0–100.0)
PLATELETS: 438 10*3/uL — AB (ref 150–400)
RBC: 4.29 MIL/uL (ref 3.87–5.11)
RDW: 14.3 % (ref 11.5–15.5)
WBC: 10.6 10*3/uL — ABNORMAL HIGH (ref 4.0–10.5)

## 2015-02-09 LAB — POCT PREGNANCY, URINE: Preg Test, Ur: NEGATIVE

## 2015-02-09 LAB — URINE MICROSCOPIC-ADD ON

## 2015-02-09 LAB — WET PREP, GENITAL
Clue Cells Wet Prep HPF POC: NONE SEEN
Yeast Wet Prep HPF POC: NONE SEEN

## 2015-02-09 MED ORDER — CEFTRIAXONE SODIUM 250 MG IJ SOLR
250.0000 mg | Freq: Once | INTRAMUSCULAR | Status: AC
  Administered 2015-02-09: 250 mg via INTRAMUSCULAR
  Filled 2015-02-09: qty 250

## 2015-02-09 MED ORDER — DOXYCYCLINE HYCLATE 100 MG PO CAPS: 100.0000 mg | ORAL_CAPSULE | Freq: Two times a day (BID) | ORAL | Status: DC

## 2015-02-09 MED ORDER — KETOROLAC TROMETHAMINE 60 MG/2ML IM SOLN
60.0000 mg | Freq: Once | INTRAMUSCULAR | Status: AC
  Administered 2015-02-09: 60 mg via INTRAMUSCULAR
  Filled 2015-02-09: qty 2

## 2015-02-09 MED ORDER — METRONIDAZOLE 500 MG PO TABS
2000.0000 mg | ORAL_TABLET | Freq: Once | ORAL | Status: AC
  Administered 2015-02-09: 2000 mg via ORAL
  Filled 2015-02-09: qty 4

## 2015-02-09 NOTE — MAU Provider Note (Signed)
History     CSN: [id number]  Arrival date and time: 02/09/15 1421   None     Chief Complaint  Patient presents with  . Possible Pregnancy   HPI 29 y.o. [id number] presents requesting "a pregnancy test and to get my ovarian cyst checked out". States she began having left sided pelvic pain starting this morning. Pt states her pain was initially 10/10, now 8/10, has not taken anything for pain, pt is resting comfortably in bed, talking on phone. States she had an ovarian cyst last year that she needs to have evaluated. States her pain resolved after that cyst was diagnosed and this is new onset pain. No bleeding or discharge.   Upon review of records, pt had pelvic u/s 08/2013 which showed a 1.8 cm involuting cyst on her right ovary.   Past Medical History  Diagnosis Date  . No pertinent past medical history     History reviewed. No pertinent past surgical history.  History reviewed. No pertinent family history.  History  Substance Use Topics  . Smoking status: Current Some Day Smoker -- 1.00 packs/day    Types: Cigarettes  . Smokeless tobacco: Never Used  . Alcohol Use: No    Allergies: No Known Allergies  No prescriptions prior to admission    Review of Systems  Constitutional: Negative.  Negative for fever and malaise/fatigue.  Respiratory: Negative.   Cardiovascular: Negative.   Gastrointestinal: Positive for abdominal pain (LLQ). Negative for nausea, vomiting, diarrhea and constipation.  Genitourinary: Negative for dysuria, urgency, frequency, hematuria and flank pain.       Negative for vaginal bleeding, vaginal discharge  Musculoskeletal: Negative.   Neurological: Negative.   Psychiatric/Behavioral: Negative.    Physical Exam   Blood pressure 122/78, pulse 76, temperature 98.5 F (36.9 C), resp. rate 18, last menstrual period 12/31/2014.  Physical Exam  Nursing note and vitals reviewed. Constitutional: She is oriented to person, place, and time. She appears  well-developed. No distress.  Obese   Cardiovascular: Normal rate.   Respiratory: Effort normal.  GI: Soft. There is no tenderness.  Genitourinary: There is no rash, tenderness or lesion on the right labia. There is no rash, tenderness or lesion on the left labia. Uterus is tender. Uterus is not enlarged. Cervix exhibits motion tenderness and discharge. Cervix exhibits no friability. Right adnexum displays tenderness and fullness. Right adnexum displays no mass. Left adnexum displays tenderness and fullness. Left adnexum displays no mass. No bleeding in the vagina. Vaginal discharge (maldorous) found.  Exam limited by body habitus   Musculoskeletal: Normal range of motion.  Neurological: She is alert and oriented to person, place, and time.  Skin: Skin is warm and dry.  Psychiatric: She has a normal mood and affect.    MAU Course  Procedures  Toradol 60 mg for pain.   Results for orders placed or performed during the hospital encounter of 02/09/15 (from the past 24 hour(s))  Urinalysis, Routine w reflex microscopic     Status: Abnormal   Collection Time: 02/09/15  2:30 PM  Result Value Ref Range   Color, Urine YELLOW YELLOW   APPearance HAZY (A) CLEAR   Specific Gravity, Urine >1.030 (H) 1.005 - 1.030   pH 6.0 5.0 - 8.0   Glucose, UA NEGATIVE NEGATIVE mg/dL   Hgb urine dipstick NEGATIVE NEGATIVE   Bilirubin Urine NEGATIVE NEGATIVE   Ketones, ur NEGATIVE NEGATIVE mg/dL   Protein, ur NEGATIVE NEGATIVE mg/dL   Urobilinogen, UA 0.2 0.0 - 1.0 mg/dL  Nitrite NEGATIVE NEGATIVE   Leukocytes, UA TRACE (A) NEGATIVE  Urine microscopic-add on     Status: Abnormal   Collection Time: 02/09/15  2:30 PM  Result Value Ref Range   Squamous Epithelial / LPF MANY (A) RARE   WBC, UA 3-6 <3 WBC/hpf   RBC / HPF 0-2 <3 RBC/hpf   Bacteria, UA MANY (A) RARE  Pregnancy, urine POC     Status: None   Collection Time: 02/09/15  2:40 PM  Result Value Ref Range   Preg Test, Ur NEGATIVE NEGATIVE  Wet  prep, genital     Status: Abnormal   Collection Time: 02/09/15  2:55 PM  Result Value Ref Range   Yeast Wet Prep HPF POC NONE SEEN NONE SEEN   Trich, Wet Prep FEW (A) NONE SEEN   Clue Cells Wet Prep HPF POC NONE SEEN NONE SEEN   WBC, Wet Prep HPF POC FEW (A) NONE SEEN  CBC     Status: Abnormal   Collection Time: 02/09/15  3:12 PM  Result Value Ref Range   WBC 10.6 (H) 4.0 - 10.5 K/uL   RBC 4.29 3.87 - 5.11 MIL/uL   Hemoglobin 11.9 (L) 12.0 - 15.0 g/dL   HCT 16.1 09.6 - 04.5 %   MCV 86.2 78.0 - 100.0 fL   MCH 27.7 26.0 - 34.0 pg   MCHC 32.2 30.0 - 36.0 g/dL   RDW 40.9 81.1 - 91.4 %   Platelets 438 (H) 150 - 400 K/uL    US Transvaginal Non-ob  02/09/2015   CLINICAL DATA:  Pelvic pain.  EXAM: TRANSABDOMINAL AND TRANSVAGINAL ULTRASOUND OF PELVIS  TECHNIQUE: Both transabdominal and transvaginal ultrasound examinations of the pelvis were performed. Transabdominal technique was performed for global imaging of the pelvis including uterus, ovaries, adnexal regions, and pelvic cul-de-sac. It was necessary to proceed with endovaginal exam following the transabdominal exam to visualize the ovaries and endometrium.  COMPARISON:  11/04/2012  FINDINGS: Uterus  Measurements: 9.6 x 5.3 x 5.8 cm. No fibroids or other mass visualized.  Endometrium  Thickness: 12.2 mm.  No focal abnormality visualized.  Right ovary  Measurements: 2.6 x 1.5 x 2.0 cm. Normal appearance/no adnexal mass.  Left ovary  Measurements: 2.8 x 2.4 x 2.1 cm. There is a tubular structure near the left ovary which may represent a hydrosalpinx. Slightly complex left adnexal fluid is also noted. Possible ruptured cyst.  Other findings  Small to moderate complex free pelvic fluid.  IMPRESSION: 1. Normal uterus and right ovary. 2. Possible left adnexal hydrosalpinx. 3. Small to moderate complex free fluid may be due to a ruptured cyst.   Electronically Signed   By: Rudie Meyer M.D.   On: 02/09/2015 18:02   US Pelvis Complete  02/09/2015    CLINICAL DATA:  Pelvic pain.  EXAM: TRANSABDOMINAL AND TRANSVAGINAL ULTRASOUND OF PELVIS  TECHNIQUE: Both transabdominal and transvaginal ultrasound examinations of the pelvis were performed. Transabdominal technique was performed for global imaging of the pelvis including uterus, ovaries, adnexal regions, and pelvic cul-de-sac. It was necessary to proceed with endovaginal exam following the transabdominal exam to visualize the ovaries and endometrium.  COMPARISON:  11/04/2012  FINDINGS: Uterus  Measurements: 9.6 x 5.3 x 5.8 cm. No fibroids or other mass visualized.  Endometrium  Thickness: 12.2 mm.  No focal abnormality visualized.  Right ovary  Measurements: 2.6 x 1.5 x 2.0 cm. Normal appearance/no adnexal mass.  Left ovary  Measurements: 2.8 x 2.4 x 2.1 cm. There is a tubular  structure near the left ovary which may represent a hydrosalpinx. Slightly complex left adnexal fluid is also noted. Possible ruptured cyst.  Other findings  Small to moderate complex free pelvic fluid.  IMPRESSION: 1. Normal uterus and right ovary. 2. Possible left adnexal hydrosalpinx. 3. Small to moderate complex free fluid may be due to a ruptured cyst.   Electronically Signed   By: Rudie Meyer M.D.   On: 02/09/2015 18:02   Consult w/ Dr. Penne Lash, agrees w/ plan to treat PID, f/u in clinic  Rocephin 250 mg IM for PID, Flagyl 2000 mg PO for Trich   Assessment and Plan   1. Acute PID (pelvic inflammatory disease)   2. Pelvic pain in female   Rocephin IM in MAU, Doxy  bid x 14 days for PID Trich treated w/ Flagyl in MAU GC/CT pending Discussed partner treatment F/U in clinic in 3 week, message sent to schedule    Medication List    TAKE these medications        doxycycline 100 MG capsule  Commonly known as:  VIBRAMYCIN  Take 1 capsule (100 mg total) by mouth 2 (two) times daily.            Follow-up Information    Follow up with [facility] In 3 weeks.   Specialty:  Obstetrics and  Gynecology   Why:  someone will call to schedule   Contact information:   [address] [place] [address] [phone]        Berk Pilot 02/09/2015, 7:08 PM

## 2015-02-09 NOTE — MAU Note (Signed)
Pt presents to MAU with complaints of lower abdominal pain and thinks she may be pregnant. Also reports a cyst on her left ovary and wants to have it checked out also.

## 2015-02-10 LAB — HIV ANTIBODY (ROUTINE TESTING W REFLEX): HIV Screen 4th Generation wRfx: NONREACTIVE

## 2015-02-11 ENCOUNTER — Encounter: Payer: Self-pay | Admitting: Family Medicine

## 2015-02-11 LAB — GC/CHLAMYDIA PROBE AMP (~~LOC~~) NOT AT ARMC
Chlamydia: NEGATIVE
NEISSERIA GONORRHEA: NEGATIVE

## 2015-03-04 ENCOUNTER — Emergency Department (HOSPITAL_COMMUNITY)
Admission: EM | Admit: 2015-03-04 | Discharge: 2015-03-04 | Disposition: A | Discharge status: Home / Self Care | Payer: Medicaid Other | Attending: Emergency Medicine | Admitting: Emergency Medicine

## 2015-03-04 ENCOUNTER — Emergency Department (HOSPITAL_COMMUNITY): Payer: Medicaid Other

## 2015-03-04 ENCOUNTER — Encounter: Payer: Medicaid Other | Admitting: Family Medicine

## 2015-03-04 ENCOUNTER — Telehealth: Payer: Self-pay | Admitting: *Deleted

## 2015-03-04 ENCOUNTER — Encounter (HOSPITAL_COMMUNITY): Payer: Self-pay | Admitting: Emergency Medicine

## 2015-03-04 DIAGNOSIS — Z720 Tobacco use: Secondary | ICD-10-CM | POA: Insufficient documentation

## 2015-03-04 DIAGNOSIS — Z3202 Encounter for pregnancy test, result negative: Secondary | ICD-10-CM | POA: Insufficient documentation

## 2015-03-04 DIAGNOSIS — Z8742 Personal history of other diseases of the female genital tract: Secondary | ICD-10-CM | POA: Insufficient documentation

## 2015-03-04 DIAGNOSIS — R1031 Right lower quadrant pain: Secondary | ICD-10-CM | POA: Insufficient documentation

## 2015-03-04 DIAGNOSIS — R102 Pelvic and perineal pain: Secondary | ICD-10-CM | POA: Insufficient documentation

## 2015-03-04 LAB — CBC WITH DIFFERENTIAL/PLATELET
Basophils Absolute: 0 10*3/uL (ref 0.0–0.1)
Basophils Relative: 0 % (ref 0–1)
Eosinophils Absolute: 0.2 10*3/uL (ref 0.0–0.7)
Eosinophils Relative: 2 % (ref 0–5)
HEMATOCRIT: 35 % — AB (ref 36.0–46.0)
HEMOGLOBIN: 11 g/dL — AB (ref 12.0–15.0)
LYMPHS ABS: 3 10*3/uL (ref 0.7–4.0)
Lymphocytes Relative: 27 % (ref 12–46)
MCH: 26.8 pg (ref 26.0–34.0)
MCHC: 31.4 g/dL (ref 30.0–36.0)
MCV: 85.4 fL (ref 78.0–100.0)
MONOS PCT: 7 % (ref 3–12)
Monocytes Absolute: 0.8 10*3/uL (ref 0.1–1.0)
NEUTROS PCT: 64 % (ref 43–77)
Neutro Abs: 7 10*3/uL (ref 1.7–7.7)
Platelets: 366 10*3/uL (ref 150–400)
RBC: 4.1 MIL/uL (ref 3.87–5.11)
RDW: 14.1 % (ref 11.5–15.5)
WBC: 11 10*3/uL — ABNORMAL HIGH (ref 4.0–10.5)

## 2015-03-04 LAB — COMPREHENSIVE METABOLIC PANEL
ALBUMIN: 3.2 g/dL — AB (ref 3.5–5.0)
ALT: 16 U/L (ref 14–54)
ANION GAP: 6 (ref 5–15)
AST: 17 U/L (ref 15–41)
Alkaline Phosphatase: 81 U/L (ref 38–126)
BILIRUBIN TOTAL: 0.3 mg/dL (ref 0.3–1.2)
BUN: 7 mg/dL (ref 6–20)
CO2: 26 mmol/L (ref 22–32)
Calcium: 8.7 mg/dL — ABNORMAL LOW (ref 8.9–10.3)
Chloride: 105 mmol/L (ref 101–111)
Creatinine, Ser: 0.92 mg/dL (ref 0.44–1.00)
GFR calc Af Amer: >60 mL/min (ref >60)
GFR calc non Af Amer: >60 mL/min (ref >60)
Glucose, Bld: 120 mg/dL — ABNORMAL HIGH (ref 70–99)
Potassium: 3.7 mmol/L (ref 3.5–5.1)
SODIUM: 137 mmol/L (ref 135–145)
Total Protein: 7.2 g/dL (ref 6.5–8.1)

## 2015-03-04 LAB — URINALYSIS, ROUTINE W REFLEX MICROSCOPIC
Glucose, UA: NEGATIVE mg/dL
Hgb urine dipstick: NEGATIVE
Ketones, ur: 15 mg/dL — AB
Leukocytes, UA: NEGATIVE
NITRITE: NEGATIVE
PH: 6 (ref 5.0–8.0)
Protein, ur: NEGATIVE mg/dL
SPECIFIC GRAVITY, URINE: 1.027 (ref 1.005–1.030)
Urobilinogen, UA: 1 mg/dL (ref 0.0–1.0)

## 2015-03-04 LAB — PREGNANCY, URINE: Preg Test, Ur: NEGATIVE

## 2015-03-04 LAB — WET PREP, GENITAL
TRICH WET PREP: NONE SEEN
Yeast Wet Prep HPF POC: NONE SEEN

## 2015-03-04 LAB — LIPASE, BLOOD: Lipase: 20 U/L — ABNORMAL LOW (ref 22–51)

## 2015-03-04 MED ORDER — IOHEXOL 300 MG/ML  SOLN
100.0000 mL | Freq: Once | INTRAMUSCULAR | Status: AC | PRN
  Administered 2015-03-04: 100 mL via INTRAVENOUS

## 2015-03-04 MED ORDER — IOHEXOL 300 MG/ML  SOLN
25.0000 mL | Freq: Once | INTRAMUSCULAR | Status: AC | PRN
Start: 2015-03-04 — End: 2015-03-04
  Administered 2015-03-04: 25 mL via ORAL

## 2015-03-04 NOTE — ED Notes (Signed)
Pt in US

## 2015-03-04 NOTE — ED Notes (Signed)
Pt reports sudden onset of rlq abdominal pain.  Denies vomiting and diarrhea, c/o nausea.

## 2015-03-04 NOTE — ED Notes (Signed)
Pt requesting social work consult. States that "she does not have a home to go to" and she would like to speak with someone for help.

## 2015-03-04 NOTE — ED Notes (Signed)
Pt returned from Ultrasound.

## 2015-03-04 NOTE — ED Notes (Signed)
Patient transported to CT 

## 2015-03-04 NOTE — ED Notes (Signed)
Pelvic cart set up 

## 2015-03-04 NOTE — Discharge Instructions (Signed)
Abdominal Pain, Women °Abdominal (stomach, pelvic, or belly) pain can be caused by many things. It is important to tell your doctor: °· The location of the pain. °· Does it come and go or is it present all the time? °· Are there things that start the pain (eating certain foods, exercise)? °· Are there other symptoms associated with the pain (fever, nausea, vomiting, diarrhea)? °All of this is helpful to know when trying to find the cause of the pain. °CAUSES  °· Stomach: virus or bacteria infection, or ulcer. °· Intestine: appendicitis (inflamed appendix), regional ileitis (Crohn's disease), ulcerative colitis (inflamed colon), irritable bowel syndrome, diverticulitis (inflamed diverticulum of the colon), or cancer of the stomach or intestine. °· Gallbladder disease or stones in the gallbladder. °· Kidney disease, kidney stones, or infection. °· Pancreas infection or cancer. °· Fibromyalgia (pain disorder). °· Diseases of the female organs: °¨ Uterus: fibroid (non-cancerous) tumors or infection. °¨ Fallopian tubes: infection or tubal pregnancy. °¨ Ovary: cysts or tumors. °¨ Pelvic adhesions (scar tissue). °¨ Endometriosis (uterus lining tissue growing in the pelvis and on the pelvic organs). °¨ Pelvic congestion syndrome (female organs filling up with blood just before the menstrual period). °¨ Pain with the menstrual period. °¨ Pain with ovulation (producing an egg). °¨ Pain with an IUD (intrauterine device, birth control) in the uterus. °¨ Cancer of the female organs. °· Functional pain (pain not caused by a disease, may improve without treatment). °· Psychological pain. °· Depression. °DIAGNOSIS  °Your doctor will decide the seriousness of your pain by doing an examination. °· Blood tests. °· X-rays. °· Ultrasound. °· CT scan (computed tomography, special type of X-ray). °· MRI (magnetic resonance imaging). °· Cultures, for infection. °· Barium enema (dye inserted in the large intestine, to better view it with  X-rays). °· Colonoscopy (looking in intestine with a lighted tube). °· Laparoscopy (minor surgery, looking in abdomen with a lighted tube). °· Major abdominal exploratory surgery (looking in abdomen with a large incision). °TREATMENT  °The treatment will depend on the cause of the pain.  °· Many cases can be observed and treated at home. °· Over-the-counter medicines recommended by your caregiver. °· Prescription medicine. °· Antibiotics, for infection. °· Birth control pills, for painful periods or for ovulation pain. °· Hormone treatment, for endometriosis. °· Nerve blocking injections. °· Physical therapy. °· Antidepressants. °· Counseling with a psychologist or psychiatrist. °· Minor or major surgery. °HOME CARE INSTRUCTIONS  °· Do not take laxatives, unless directed by your caregiver. °· Take over-the-counter pain medicine only if ordered by your caregiver. Do not take aspirin because it can cause an upset stomach or bleeding. °· Try a clear liquid diet (broth or water) as ordered by your caregiver. Slowly move to a bland diet, as tolerated, if the pain is related to the stomach or intestine. °· Have a thermometer and take your temperature several times a day, and record it. °· Bed rest and sleep, if it helps the pain. °· Avoid sexual intercourse, if it causes pain. °· Avoid stressful situations. °· Keep your follow-up appointments and tests, as your caregiver orders. °· If the pain does not go away with medicine or surgery, you may try: °¨ Acupuncture. °¨ Relaxation exercises (yoga, meditation). °¨ Group therapy. °¨ Counseling. °SEEK MEDICAL CARE IF:  °· You notice certain foods cause stomach pain. °· Your home care treatment is not helping your pain. °· You need stronger pain medicine. °· You want your IUD removed. °· You feel faint or   lightheaded. °· You develop nausea and vomiting. °· You develop a rash. °· You are having side effects or an allergy to your medicine. °SEEK IMMEDIATE MEDICAL CARE IF:  °· Your  pain does not go away or gets worse. °· You have a fever. °· Your pain is felt only in portions of the abdomen. The right side could possibly be appendicitis. The left lower portion of the abdomen could be colitis or diverticulitis. °· You are passing blood in your stools (bright red or black tarry stools, with or without vomiting). °· You have blood in your urine. °· You develop chills, with or without a fever. °· You pass out. °MAKE SURE YOU:  °· Understand these instructions. °· Will watch your condition. °· Will get help right away if you are not doing well or get worse. °Document Released: 08/09/2007 Document Revised: 02/26/2014 Document Reviewed: 08/29/2009 °ExitCare® Patient Information ©2015 [employer]. This information is not intended to replace advice given to you by your health care provider. Make sure you discuss any questions you have with your health care provider. ° °

## 2015-03-04 NOTE — Telephone Encounter (Signed)
[facility]ed [facility], [facility] pt will call and reschedule appointment after discharge.

## 2015-03-04 NOTE — ED Provider Notes (Signed)
CSN: [id number]     Arrival date & time 03/04/15  1604 History   First MD Initiated Contact with Patient 03/04/15 1609     Chief Complaint  Patient presents with  . Abdominal Pain     (Consider location/radiation/quality/duration/timing/severity/associated sxs/prior Treatment) Patient is a 29 y.o. female presenting with abdominal pain. The history is provided by the patient.  Abdominal Pain Pain location:  RLQ Pain quality: sharp   Pain radiates to:  Does not radiate Pain severity:  Moderate Onset quality:  Sudden Duration:  2 hours Timing:  Constant Progression:  Improving Chronicity:  New Context: not previous surgeries, not recent illness, not suspicious food intake and not trauma   Relieved by:  Nothing Worsened by:  Nothing tried Ineffective treatments:  None tried Associated symptoms: no anorexia, no chest pain, no chills, no constipation, no cough, no diarrhea, no dysuria, no fatigue, no fever, no hematemesis, no hematochezia, no hematuria, no melena, no nausea, no shortness of breath, no vaginal bleeding, no vaginal discharge and no vomiting     Past Medical History  Diagnosis Date  . No pertinent past medical history    History reviewed. No pertinent past surgical history. History reviewed. No pertinent family history. History  Substance Use Topics  . Smoking status: Current Some Day Smoker -- 0.50 packs/day for 3 years    Types: Cigarettes  . Smokeless tobacco: Never Used  . Alcohol Use: No   OB History    Gravida Para Term Preterm AB TAB SAB Ectopic Multiple Living   0 0 0 0 0 0 2     Review of Systems  Constitutional: Negative for fever, chills, diaphoresis, appetite change and fatigue.  Respiratory: Negative for cough, chest tightness and shortness of breath.   Cardiovascular: Negative for chest pain, palpitations and leg swelling.  Gastrointestinal: Positive for abdominal pain. Negative for nausea, vomiting, diarrhea, constipation, melena,  hematochezia, anorexia and hematemesis.  Genitourinary: Negative for dysuria, urgency, frequency, hematuria, flank pain, decreased urine volume, vaginal bleeding, vaginal discharge, difficulty urinating, menstrual problem and pelvic pain.  Musculoskeletal: Negative for back pain, neck pain and neck stiffness.  Skin: Negative for color change, pallor and rash.  Neurological: Negative for dizziness, light-headedness and headaches.  All other systems reviewed and are negative.     Allergies  Review of patient's allergies indicates no known allergies.  Home Medications   Prior to Admission medications   Medication Sig Start Date End Date Taking? Authorizing Provider  doxycycline (VIBRAMYCIN) 100 MG capsule Take 1 capsule (100 mg total) by mouth 2 (two) times daily. Patient not taking: Reported on 03/04/2015 02/09/15   Archie Patten, CNM   BP 123/56 mmHg  Pulse 65  Temp(Src) 98.4 F (36.9 C) (Oral)  Resp 14  Ht  (1.6 m)  Wt 250 lb (113.399 kg)  BMI 44.30 kg/m2  SpO2 100%  LMP 01/31/2015 Physical Exam  Constitutional: She is oriented to person, place, and time. She appears well-developed and well-nourished. No distress.  HENT:  Head: Normocephalic and atraumatic.  Mouth/Throat: Oropharynx is clear and moist.  Eyes: Conjunctivae and EOM are normal. Pupils are equal, round, and reactive to light.  Neck: Normal range of motion. Neck supple.  Cardiovascular: Normal rate, regular rhythm, normal heart sounds and intact distal pulses.  Exam reveals no gallop and no friction rub.   No murmur heard. Pulmonary/Chest: Effort normal and breath sounds normal. No respiratory distress. She has no wheezes. She has no rales.  Abdominal:  Normal appearance and bowel sounds are normal. She exhibits no distension and no mass. There is tenderness in the right lower quadrant. There is tenderness at McBurney's point. There is no rigidity, no rebound, no guarding, no CVA tenderness and negative  Murphy's sign.  Genitourinary: Vagina normal and uterus normal. There is no rash, tenderness or lesion on the right labia. There is no rash, tenderness or lesion on the left labia. Uterus is not tender. Cervix exhibits no motion tenderness, no discharge and no friability. Right adnexum displays tenderness. Right adnexum displays no mass and no fullness. Left adnexum displays no mass, no tenderness and no fullness. No erythema, tenderness or bleeding in the vagina. No foreign body around the vagina. No signs of injury around the vagina. No vaginal discharge found.  Musculoskeletal: Normal range of motion. She exhibits no edema or tenderness.  Neurological: She is alert and oriented to person, place, and time.  Skin: Skin is warm and dry. No rash noted. She is not diaphoretic. No erythema. No pallor.  Nursing note and vitals reviewed.   ED Course  Procedures (including critical care time) Labs Review Labs Reviewed  WET PREP, GENITAL - Abnormal; Notable for the following:    Clue Cells Wet Prep HPF POC FEW (*)    WBC, Wet Prep HPF POC FEW (*)    All other components within normal limits  URINALYSIS, ROUTINE W REFLEX MICROSCOPIC - Abnormal; Notable for the following:    Color, Urine AMBER (*)    APPearance HAZY (*)    Bilirubin Urine SMALL (*)    Ketones, ur 15 (*)    All other components within normal limits  CBC WITH DIFFERENTIAL/PLATELET - Abnormal; Notable for the following:    WBC 11.0 (*)    Hemoglobin 11.0 (*)    HCT 35.0 (*)    All other components within normal limits  COMPREHENSIVE METABOLIC PANEL - Abnormal; Notable for the following:    Glucose, Bld 120 (*)    Calcium 8.7 (*)    Albumin 3.2 (*)    All other components within normal limits  LIPASE, BLOOD - Abnormal; Notable for the following:    Lipase 20 (*)    All other components within normal limits  PREGNANCY, URINE  HIV ANTIBODY (ROUTINE TESTING)  GC/CHLAMYDIA PROBE AMP ([place])    Imaging Review [place]  Transvaginal Non-ob  03/04/2015   CLINICAL DATA:  Right lower quadrant abdominal pain  EXAM: TRANSABDOMINAL AND TRANSVAGINAL ULTRASOUND OF PELVIS  TECHNIQUE: Both transabdominal and transvaginal ultrasound examinations of the pelvis were performed. Transabdominal technique was performed for global imaging of the pelvis including uterus, ovaries, adnexal regions, and pelvic cul-de-sac. It was necessary to proceed with endovaginal exam following the transabdominal exam to visualize the ovaries and adnexal structures.  COMPARISON:  None  FINDINGS: Uterus  Measurements: 8.8 x 4.7 x 5.2 cm. No fibroids or other mass visualized.  Endometrium  Thickness: 5.7 mm.  No focal abnormality visualized.  Right ovary  Measurements: 3 x 1.9 x 3.2 cm. Cyst in right ovary measures 1.7 cm. No adnexal mass noted.  Left ovary  Measurements: 3 x 1.8 x 2.1 cm. Normal appearance/no adnexal mass.  Other findings  Small amount of mildly complex fluid is identified within the cul-de-sac.  IMPRESSION: 1. Small amount of mildly complex sleeve fluid noted within the cul-de-sac. Findings may be related to ruptured cyst. 2. Normal uterus and right ovary.   Electronically Signed   By: Veronda Prudeaylor  Stroud M.D.  On: 03/04/2015 19:03   [place] Pelvis Complete  03/04/2015   CLINICAL DATA:  Right lower quadrant abdominal pain  EXAM: TRANSABDOMINAL AND TRANSVAGINAL ULTRASOUND OF PELVIS  TECHNIQUE: Both transabdominal and transvaginal ultrasound examinations of the pelvis were performed. Transabdominal technique was performed for global imaging of the pelvis including uterus, ovaries, adnexal regions, and pelvic cul-de-sac. It was necessary to proceed with endovaginal exam following the transabdominal exam to visualize the ovaries and adnexal structures.  COMPARISON:  None  FINDINGS: Uterus  Measurements: 8.8 x 4.7 x 5.2 cm. No fibroids or other mass visualized.  Endometrium  Thickness: 5.7 mm.  No focal abnormality visualized.  Right ovary  Measurements: 3 x 1.9  x 3.2 cm. Cyst in right ovary measures 1.7 cm. No adnexal mass noted.  Left ovary  Measurements: 3 x 1.8 x 2.1 cm. Normal appearance/no adnexal mass.  Other findings  Small amount of mildly complex fluid is identified within the cul-de-sac.  IMPRESSION: 1. Small amount of mildly complex sleeve fluid noted within the cul-de-sac. Findings may be related to ruptured cyst. 2. Normal uterus and right ovary.   Electronically Signed   By: Signa Kellaylor  Stroud M.D.   On: 03/04/2015 19:03   Ct Abdomen Pelvis W Contrast  03/04/2015   CLINICAL DATA:  Right lower quadrant abdominal pain  EXAM: CT ABDOMEN AND PELVIS WITH CONTRAST  TECHNIQUE: Multidetector CT imaging of the abdomen and pelvis was performed using the standard protocol following bolus administration of intravenous contrast.  CONTRAST:  100mL OMNIPAQUE IOHEXOL 300 MG/ML  SOLN  COMPARISON:  None.  FINDINGS: Lower chest: The lung bases appear clear. No pleural or pericardial effusion.  Hepatobiliary: There is no focal liver abnormalities. The gallbladder appears normal.  Pancreas: Normal appearance of the pancreas.  Spleen: Negative  Adrenals/Urinary Tract: The adrenal glands are both normal. Unremarkable appearance of both kidneys. The urinary bladder appears normal.  Stomach/Bowel: The stomach is unremarkable. The small bowel loops appear normal. The appendix is visualized in the right lower quadrant there is gas within the lumen of the appendix the appendix has a maximum diameter of 5 mm. Normal appearance of the colon.  Vascular/Lymphatic: Normal appearance of the abdominal aorta. No enlarged retroperitoneal or mesenteric adenopathy. No enlarged pelvic or inguinal lymph nodes.  Reproductive: The uterus and the adnexal structures are unremarkable.  Other: A small amount of free fluid noted within the dependent portion of the pelvis.  Musculoskeletal: Unremarkable.  IMPRESSION: 1. No acute findings identified within the abdomen or pelvis. The appendix is visualized and  is within normal limits. 2. Small amount of free fluid is noted within the pelvis. This may be physiologic in a premenopausal female.   Electronically Signed   By: Signa Kellaylor  Stroud M.D.   On: 03/04/2015 21:17     EKG Interpretation None      MDM   Final diagnoses:  RLQ abdominal pain    29 yo F with reported PMH of ovarian cyst on left presenting with sudden onset of RLQ abdominal pain about 2 hours prior to presentation.  Pt at rest when pain started, no inciting trauma or injury.  Pain non-radiating, sharp, constant although now improved since onset.  She denies fevers, N/V/D, dysuria, vaginal bleeding or discharge. LMP about 1 month ago. Was treated for pelvic infection last month with Doxycycline, pt unsure of diagnosis but completed treatment.  On presentation, pt alert, AFVSS, well appearing in NAD.  CV and lung exam WNL.  Abdomen soft, TTP in RLQ without  rebound, guarding, distention.  No CVAT.  Pelvic exam with R adnexal TTP; no fullness.  No vaginal bleeding, discharge, CMT.  Exam otherwise WNL.  Possible ovarian torsion, cyst, TOA vs appendicitis vs kidney stone vs UTI.  UPT negative, not consistent with ectopic pregnancy. Pelvic exam not consistent with PID.  Plan for labs, pelvis U/S as there is higher suspicion for ovarian pathology than appendicitis given sudden onset without other associated GI symptoms.   Lab results WNL.  Ultrasound shows small amount of complex fluid in cul-de-sac;  No adnexal masses or evidence of torsion.  Given free fluid present and no ovarian etiology, CT obtained to r/o appendicitis-  CT results negative for acute abnormality.  Pain may be 2/2 ovarian cyst.  Pt reports resolution of pain and is requesting discharge.  ED return precautions given.  No other concerns.  Discussed with attending Dr. Manus Gunning.   Jodean Lima, MD 03/06/15 1730  Glynn Octave, MD 03/07/15 1049

## 2015-03-05 LAB — HIV ANTIBODY (ROUTINE TESTING W REFLEX): HIV Screen 4th Generation wRfx: NONREACTIVE

## 2015-03-15 ENCOUNTER — Encounter (HOSPITAL_COMMUNITY): Payer: Self-pay | Admitting: Emergency Medicine

## 2015-03-15 ENCOUNTER — Emergency Department (HOSPITAL_COMMUNITY): Payer: Medicaid Other

## 2015-03-15 ENCOUNTER — Emergency Department (HOSPITAL_COMMUNITY)
Admission: EM | Admit: 2015-03-15 | Discharge: 2015-03-15 | Disposition: A | Discharge status: Home / Self Care | Payer: Medicaid Other | Attending: Emergency Medicine | Admitting: Emergency Medicine

## 2015-03-15 DIAGNOSIS — Y9289 Other specified places as the place of occurrence of the external cause: Secondary | ICD-10-CM | POA: Insufficient documentation

## 2015-03-15 DIAGNOSIS — S0990XA Unspecified injury of head, initial encounter: Secondary | ICD-10-CM | POA: Insufficient documentation

## 2015-03-15 DIAGNOSIS — S4992XA Unspecified injury of left shoulder and upper arm, initial encounter: Secondary | ICD-10-CM | POA: Insufficient documentation

## 2015-03-15 DIAGNOSIS — Y998 Other external cause status: Secondary | ICD-10-CM | POA: Insufficient documentation

## 2015-03-15 DIAGNOSIS — Z720 Tobacco use: Secondary | ICD-10-CM | POA: Insufficient documentation

## 2015-03-15 DIAGNOSIS — Y9389 Activity, other specified: Secondary | ICD-10-CM | POA: Insufficient documentation

## 2015-03-15 DIAGNOSIS — Z3202 Encounter for pregnancy test, result negative: Secondary | ICD-10-CM | POA: Insufficient documentation

## 2015-03-15 DIAGNOSIS — M25512 Pain in left shoulder: Secondary | ICD-10-CM

## 2015-03-15 LAB — POC URINE PREG, ED: Preg Test, Ur: NEGATIVE

## 2015-03-15 MED ORDER — IBUPROFEN 600 MG PO TABS: 600.0000 mg | ORAL_TABLET | Freq: Three times a day (TID) | ORAL | Status: DC | PRN

## 2015-03-15 MED ORDER — IBUPROFEN 800 MG PO TABS
800.0000 mg | ORAL_TABLET | Freq: Once | ORAL | Status: AC
  Administered 2015-03-15: 800 mg via ORAL
  Filled 2015-03-15: qty 1

## 2015-03-15 MED ORDER — OXYCODONE-ACETAMINOPHEN 5-325 MG PO TABS
1.0000 | ORAL_TABLET | Freq: Once | ORAL | Status: AC
  Administered 2015-03-15: 1 via ORAL
  Filled 2015-03-15: qty 1
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
## 2015-03-16 ENCOUNTER — Emergency Department (HOSPITAL_COMMUNITY)
Admission: EM | Admit: 2015-03-16 | Discharge: 2015-03-17 | Disposition: A | Discharge status: Home / Self Care | Payer: Self-pay | Attending: Emergency Medicine | Admitting: Emergency Medicine

## 2015-03-16 ENCOUNTER — Encounter (HOSPITAL_COMMUNITY): Payer: Self-pay | Admitting: *Deleted

## 2015-03-16 DIAGNOSIS — B9689 Other specified bacterial agents as the cause of diseases classified elsewhere: Secondary | ICD-10-CM

## 2015-03-16 DIAGNOSIS — R102 Pelvic and perineal pain: Secondary | ICD-10-CM | POA: Insufficient documentation

## 2015-03-16 DIAGNOSIS — Z8742 Personal history of other diseases of the female genital tract: Secondary | ICD-10-CM | POA: Insufficient documentation

## 2015-03-16 DIAGNOSIS — N760 Acute vaginitis: Secondary | ICD-10-CM | POA: Insufficient documentation

## 2015-03-16 DIAGNOSIS — E669 Obesity, unspecified: Secondary | ICD-10-CM | POA: Insufficient documentation

## 2015-03-16 DIAGNOSIS — Z720 Tobacco use: Secondary | ICD-10-CM | POA: Insufficient documentation

## 2015-03-16 DIAGNOSIS — Z3202 Encounter for pregnancy test, result negative: Secondary | ICD-10-CM | POA: Insufficient documentation

## 2015-03-16 LAB — CBC WITH DIFFERENTIAL/PLATELET
BASOS PCT: 0 % (ref 0–1)
Basophils Absolute: 0 10*3/uL (ref 0.0–0.1)
EOS ABS: 0.1 10*3/uL (ref 0.0–0.7)
Eosinophils Relative: 1 % (ref 0–5)
HCT: 36.1 % (ref 36.0–46.0)
Hemoglobin: 11.8 g/dL — ABNORMAL LOW (ref 12.0–15.0)
LYMPHS ABS: 4.5 10*3/uL — AB (ref 0.7–4.0)
Lymphocytes Relative: 41 % (ref 12–46)
MCH: 27.3 pg (ref 26.0–34.0)
MCHC: 32.7 g/dL (ref 30.0–36.0)
MCV: 83.4 fL (ref 78.0–100.0)
MONO ABS: 0.9 10*3/uL (ref 0.1–1.0)
Monocytes Relative: 8 % (ref 3–12)
Neutro Abs: 5.5 10*3/uL (ref 1.7–7.7)
Neutrophils Relative %: 50 % (ref 43–77)
PLATELETS: 395 10*3/uL (ref 150–400)
RBC: 4.33 MIL/uL (ref 3.87–5.11)
RDW: 14.2 % (ref 11.5–15.5)
WBC: 11 10*3/uL — AB (ref 4.0–10.5)

## 2015-03-16 LAB — COMPREHENSIVE METABOLIC PANEL
ALT: 18 U/L (ref 14–54)
AST: 32 U/L (ref 15–41)
Albumin: 3.8 g/dL (ref 3.5–5.0)
Alkaline Phosphatase: 70 U/L (ref 38–126)
Anion gap: 8 (ref 5–15)
BUN: 8 mg/dL (ref 6–20)
CO2: 25 mmol/L (ref 22–32)
Calcium: 8.6 mg/dL — ABNORMAL LOW (ref 8.9–10.3)
Chloride: 98 mmol/L — ABNORMAL LOW (ref 101–111)
Creatinine, Ser: 0.9 mg/dL (ref 0.44–1.00)
GFR calc Af Amer: >60 mL/min (ref >60)
GLUCOSE: 92 mg/dL (ref 65–99)
POTASSIUM: 4.2 mmol/L (ref 3.5–5.1)
Sodium: 131 mmol/L — ABNORMAL LOW (ref 135–145)
TOTAL PROTEIN: 7.8 g/dL (ref 6.5–8.1)
Total Bilirubin: 1.1 mg/dL (ref 0.3–1.2)

## 2015-03-16 LAB — URINALYSIS, ROUTINE W REFLEX MICROSCOPIC
Glucose, UA: NEGATIVE mg/dL
HGB URINE DIPSTICK: NEGATIVE
Ketones, ur: 15 mg/dL — AB
Leukocytes, UA: NEGATIVE
NITRITE: NEGATIVE
Protein, ur: NEGATIVE mg/dL
Specific Gravity, Urine: 1.03 (ref 1.005–1.030)
Urobilinogen, UA: 1 mg/dL (ref 0.0–1.0)
pH: 5.5 (ref 5.0–8.0)

## 2015-03-16 LAB — HCG, QUANTITATIVE, PREGNANCY: HCG, BETA CHAIN, QUANT, S: 2 m[IU]/mL (ref <5)

## 2015-03-16 LAB — HCG, SERUM, QUALITATIVE: Preg, Serum: NEGATIVE

## 2015-03-16 LAB — LIPASE, BLOOD: Lipase: 20 U/L — ABNORMAL LOW (ref 22–51)

## 2015-03-16 LAB — I-STAT BETA HCG BLOOD, ED (MC, WL, AP ONLY): I-stat hCG, quantitative: 6.9 m[IU]/mL — ABNORMAL HIGH (ref <5)

## 2015-03-16 LAB — PREGNANCY, URINE: PREG TEST UR: NEGATIVE

## 2015-03-16 MED ORDER — AZITHROMYCIN 250 MG PO TABS
1000.0000 mg | ORAL_TABLET | Freq: Once | ORAL | Status: AC
  Administered 2015-03-17: 1000 mg via ORAL
  Filled 2015-03-16: qty 4

## 2015-03-16 MED ORDER — DOXYCYCLINE HYCLATE 100 MG PO TABS
100.0000 mg | ORAL_TABLET | Freq: Once | ORAL | Status: AC
  Administered 2015-03-17: 100 mg via ORAL
  Filled 2015-03-16: qty 1

## 2015-03-16 MED ORDER — CEFTRIAXONE SODIUM 250 MG IJ SOLR
250.0000 mg | Freq: Once | INTRAMUSCULAR | Status: AC
  Administered 2015-03-17: 250 mg via INTRAMUSCULAR
  Filled 2015-03-16: qty 250

## 2015-03-16 MED ORDER — ACETAMINOPHEN 500 MG PO TABS
1000.0000 mg | ORAL_TABLET | Freq: Once | ORAL | Status: AC
  Administered 2015-03-16: 1000 mg via ORAL
  Filled 2015-03-16: qty 2
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
## 2015-03-16 NOTE — ED Notes (Signed)
Pt in c/o generalized abd pain that started this morning, denies n/v, LMP April 7th

## 2015-03-17 ENCOUNTER — Emergency Department (HOSPITAL_COMMUNITY): Payer: Self-pay

## 2015-03-17 LAB — WET PREP, GENITAL
Trich, Wet Prep: NONE SEEN
YEAST WET PREP: NONE SEEN

## 2015-03-17 LAB — HIV ANTIBODY (ROUTINE TESTING W REFLEX): HIV Screen 4th Generation wRfx: NONREACTIVE

## 2015-03-17 LAB — RPR: RPR Ser Ql: NONREACTIVE

## 2015-03-17 MED ORDER — LIDOCAINE HCL (PF) 1 % IJ SOLN
INTRAMUSCULAR | Status: AC
  Filled 2015-03-17: qty 5

## 2015-03-17 MED ORDER — METRONIDAZOLE 500 MG PO TABS
500.0000 mg | ORAL_TABLET | Freq: Once | ORAL | Status: AC
  Administered 2015-03-17: 500 mg via ORAL
  Filled 2015-03-17: qty 1

## 2015-03-17 MED ORDER — LIDOCAINE HCL (PF) 1 % IJ SOLN
0.9000 mL | Freq: Once | INTRAMUSCULAR | Status: AC
  Administered 2015-03-17: 0.9 mL

## 2015-03-17 MED ORDER — IBUPROFEN 800 MG PO TABS: 800.0000 mg | ORAL_TABLET | Freq: Three times a day (TID) | ORAL | Status: DC | PRN

## 2015-03-17 MED ORDER — METRONIDAZOLE 500 MG PO TABS: 500.0000 mg | ORAL_TABLET | Freq: Two times a day (BID) | ORAL | Status: DC

## 2015-03-17 NOTE — Discharge Instructions (Signed)
Abdominal Pain, Women °Abdominal (stomach, pelvic, or belly) pain can be caused by many things. It is important to tell your doctor: °· The location of the pain. °· Does it come and go or is it present all the time? °· Are there things that start the pain (eating certain foods, exercise)? °· Are there other symptoms associated with the pain (fever, nausea, vomiting, diarrhea)? °All of this is helpful to know when trying to find the cause of the pain. °CAUSES  °· Stomach: virus or bacteria infection, or ulcer. °· Intestine: appendicitis (inflamed appendix), regional ileitis (Crohn's disease), ulcerative colitis (inflamed colon), irritable bowel syndrome, diverticulitis (inflamed diverticulum of the colon), or cancer of the stomach or intestine. °· Gallbladder disease or stones in the gallbladder. °· Kidney disease, kidney stones, or infection. °· Pancreas infection or cancer. °· Fibromyalgia (pain disorder). °· Diseases of the female organs: °· Uterus: fibroid (non-cancerous) tumors or infection. °· Fallopian tubes: infection or tubal pregnancy. °· Ovary: cysts or tumors. °· Pelvic adhesions (scar tissue). °· Endometriosis (uterus lining tissue growing in the pelvis and on the pelvic organs). °· Pelvic congestion syndrome (female organs filling up with blood just before the menstrual period). °· Pain with the menstrual period. °· Pain with ovulation (producing an egg). °· Pain with an IUD (intrauterine device, birth control) in the uterus. °· Cancer of the female organs. °· Functional pain (pain not caused by a disease, may improve without treatment). °· Psychological pain. °· Depression. °DIAGNOSIS  °Your doctor will decide the seriousness of your pain by doing an examination. °· Blood tests. °· X-rays. °· Ultrasound. °· CT scan (computed tomography, special type of X-ray). °· MRI (magnetic resonance imaging). °· Cultures, for infection. °· Barium enema (dye inserted in the large intestine, to better view it with  X-rays). °· Colonoscopy (looking in intestine with a lighted tube). °· Laparoscopy (minor surgery, looking in abdomen with a lighted tube). °· Major abdominal exploratory surgery (looking in abdomen with a large incision). °TREATMENT  °The treatment will depend on the cause of the pain.  °· Many cases can be observed and treated at home. °· Over-the-counter medicines recommended by your caregiver. °· Prescription medicine. °· Antibiotics, for infection. °· Birth control pills, for painful periods or for ovulation pain. °· Hormone treatment, for endometriosis. °· Nerve blocking injections. °· Physical therapy. °· Antidepressants. °· Counseling with a psychologist or psychiatrist. °· Minor or major surgery. °HOME CARE INSTRUCTIONS  °· Do not take laxatives, unless directed by your caregiver. °· Take over-the-counter pain medicine only if ordered by your caregiver. Do not take aspirin because it can cause an upset stomach or bleeding. °· Try a clear liquid diet (broth or water) as ordered by your caregiver. Slowly move to a bland diet, as tolerated, if the pain is related to the stomach or intestine. °· Have a thermometer and take your temperature several times a day, and record it. °· Bed rest and sleep, if it helps the pain. °· Avoid sexual intercourse, if it causes pain. °· Avoid stressful situations. °· Keep your follow-up appointments and tests, as your caregiver orders. °· If the pain does not go away with medicine or surgery, you may try: °· Acupuncture. °· Relaxation exercises (yoga, meditation). °· Group therapy. °· Counseling. °SEEK MEDICAL CARE IF:  °· You notice certain foods cause stomach pain. °· Your home care treatment is not helping your pain. °· You need stronger pain medicine. °· You want your IUD removed. °· You feel faint or   lightheaded.  You develop nausea and vomiting.  You develop a rash.  You are having side effects or an allergy to your medicine. SEEK IMMEDIATE MEDICAL CARE IF:   Your  pain does not go away or gets worse.  You have a fever.  Your pain is felt only in portions of the abdomen. The right side could possibly be appendicitis. The left lower portion of the abdomen could be colitis or diverticulitis.  You are passing blood in your stools (bright red or black tarry stools, with or without vomiting).  You have blood in your urine.  You develop chills, with or without a fever.  You pass out. MAKE SURE YOU:   Understand these instructions.  Will watch your condition.  Will get help right away if you are not doing well or get worse. Document Released: 08/09/2007 Document Revised: 02/26/2014 Document Reviewed: 08/29/2009 [facility] Patient Information 2015 [place], [place]. This information is not intended to replace advice given to you by your health care provider. Make sure you discuss any questions you have with your health care provider.  Bacterial Vaginosis Bacterial vaginosis is a vaginal infection that occurs when the normal balance of bacteria in the vagina is disrupted. It results from an overgrowth of certain bacteria. This is the most common vaginal infection in women of childbearing age. Treatment is important to prevent complications, especially in pregnant women, as it can cause a premature delivery. CAUSES  Bacterial vaginosis is caused by an increase in harmful bacteria that are normally present in smaller amounts in the vagina. Several different kinds of bacteria can cause bacterial vaginosis. However, the reason that the condition develops is not fully understood. RISK FACTORS Certain activities or behaviors can put you at an increased risk of developing bacterial vaginosis, including:  Having a new sex partner or multiple sex partners.  Douching.  Using an intrauterine device (IUD) for contraception. Women do not get bacterial vaginosis from toilet seats, bedding, swimming pools, or contact with objects around them. SIGNS AND SYMPTOMS  Some  women with bacterial vaginosis have no signs or symptoms. Common symptoms include:  Grey vaginal discharge.  A fishlike odor with discharge, especially after sexual intercourse.  Itching or burning of the vagina and vulva.  Burning or pain with urination. DIAGNOSIS  Your health care provider will take a medical history and examine the vagina for signs of bacterial vaginosis. A sample of vaginal fluid may be taken. Your health care provider will look at this sample under a microscope to check for bacteria and abnormal cells. A vaginal pH test may also be done.  TREATMENT  Bacterial vaginosis may be treated with antibiotic medicines. These may be given in the form of a pill or a vaginal cream. A second round of antibiotics may be prescribed if the condition comes back after treatment.  HOME CARE INSTRUCTIONS   Only take over-the-counter or prescription medicines as directed by your health care provider.  If antibiotic medicine was prescribed, take it as directed. Make sure you finish it even if you start to feel better.  Do not have sex until treatment is completed.  Tell all sexual partners that you have a vaginal infection. They should see their health care provider and be treated if they have problems, such as a mild rash or itching.  Practice safe sex by using condoms and only having one sex partner. SEEK MEDICAL CARE IF:   Your symptoms are not improving after 3 days of treatment.  You have increased  discharge or pain.  You have a fever. MAKE SURE YOU:   Understand these instructions.  Will watch your condition.  Will get help right away if you are not doing well or get worse. FOR MORE INFORMATION  Centers for Disease Control and Prevention, Division of STD Prevention: SolutionApps.co.zawww.cdc.gov/std American Sexual Health Association (ASHA): www.ashastd.org  Document Released: 10/12/2005 Document Revised: 08/02/2013 Document Reviewed: 05/24/2013 [facility] Patient Information 2015  [place]. This information is not intended to replace advice given to you by your health care provider. Make sure you discuss any questions you have with your health care provider.    [facility] [email or web address] [address] [place], [place] [phone]    [facility] [email or web address] [address] [place], [place] [phone]    [facility] [address] [place], [place] [phone]   [facility] [email or web address] [address] [place], [place] [phone]   [facility] [email or web address] [address] [place], [place] [phone]   [facility] [email or web address] [address] [place], [place] [phone]

## 2015-03-17 NOTE — ED Notes (Signed)
Patient transported to Ultrasound 

## 2015-03-17 NOTE — ED Notes (Signed)
US Tech called to let this nurse know that it will be a little while before she can take patient over, stated she has two other cases ahead of patient.

## 2015-03-18 LAB — GC/CHLAMYDIA PROBE AMP (~~LOC~~) NOT AT ARMC
CHLAMYDIA, DNA PROBE: NEGATIVE
Neisseria Gonorrhea: NEGATIVE

## 2015-05-27 ENCOUNTER — Inpatient Hospital Stay (HOSPITAL_COMMUNITY)
Admission: AD | Admit: 2015-05-27 | Discharge: 2015-05-27 | Disposition: A | Discharge status: Home / Self Care | Payer: Medicaid Other | Source: Ambulatory Visit | Attending: Family Medicine | Admitting: Family Medicine

## 2015-05-27 ENCOUNTER — Encounter (HOSPITAL_COMMUNITY): Payer: Self-pay | Admitting: *Deleted

## 2015-05-27 DIAGNOSIS — O9989 Other specified diseases and conditions complicating pregnancy, childbirth and the puerperium: Secondary | ICD-10-CM | POA: Insufficient documentation

## 2015-05-27 DIAGNOSIS — O99330 Smoking (tobacco) complicating pregnancy, unspecified trimester: Secondary | ICD-10-CM | POA: Insufficient documentation

## 2015-05-27 DIAGNOSIS — F17210 Nicotine dependence, cigarettes, uncomplicated: Secondary | ICD-10-CM | POA: Insufficient documentation

## 2015-05-27 DIAGNOSIS — Z3A00 Weeks of gestation of pregnancy not specified: Secondary | ICD-10-CM | POA: Insufficient documentation

## 2015-05-27 DIAGNOSIS — O99611 Diseases of the digestive system complicating pregnancy, first trimester: Secondary | ICD-10-CM

## 2015-05-27 DIAGNOSIS — K5900 Constipation, unspecified: Secondary | ICD-10-CM | POA: Insufficient documentation

## 2015-05-27 DIAGNOSIS — R109 Unspecified abdominal pain: Secondary | ICD-10-CM | POA: Diagnosis present

## 2015-05-27 HISTORY — DX: Chlamydial infection, unspecified: A74.9

## 2015-05-27 HISTORY — DX: Gonococcal infection, unspecified: A54.9

## 2015-05-27 HISTORY — DX: Syphilis, unspecified: A53.9

## 2015-05-27 HISTORY — DX: Unspecified ovarian cyst, unspecified side: N83.209

## 2015-05-27 HISTORY — DX: Unspecified infectious disease: B99.9

## 2015-05-27 LAB — URINE MICROSCOPIC-ADD ON

## 2015-05-27 LAB — URINALYSIS, ROUTINE W REFLEX MICROSCOPIC
Bilirubin Urine: NEGATIVE
Glucose, UA: NEGATIVE mg/dL
HGB URINE DIPSTICK: NEGATIVE
Ketones, ur: NEGATIVE mg/dL
Nitrite: NEGATIVE
PH: 6 (ref 5.0–8.0)
PROTEIN: NEGATIVE mg/dL
SPECIFIC GRAVITY, URINE: 1.025 (ref 1.005–1.030)
Urobilinogen, UA: 0.2 mg/dL (ref 0.0–1.0)

## 2015-05-27 LAB — POCT PREGNANCY, URINE: Preg Test, Ur: POSITIVE — AB

## 2015-05-27 MED ORDER — DOCUSATE SODIUM 250 MG PO CAPS: 250.0000 mg | ORAL_CAPSULE | Freq: Two times a day (BID) | ORAL | Status: DC

## 2015-05-27 MED ORDER — MAGNESIUM HYDROXIDE 400 MG/5ML PO SUSP
30.0000 mL | Freq: Once | ORAL | Status: AC
  Administered 2015-05-27: 30 mL via ORAL
  Filled 2015-05-27: qty 30

## 2015-05-27 NOTE — MAU Provider Note (Signed)
  History   G3P2002 in with roaming abd pain that comes and goes. Also c/o constipation. preg pos this visit. Was neg 03/16/15  CSN: [id number]  Arrival date and time: 05/27/15 1529   None     No chief complaint on file.  HPI  OB History    Gravida Para Term Preterm AB TAB SAB Ectopic Multiple Living   0 0 0 0 0 0 2      Past Medical History  Diagnosis Date  . No pertinent past medical history   . Infection     UTI  . Ovarian cyst   . Gonorrhea   . Chlamydia   . Syphilis     Past Surgical History  Procedure Laterality Date  . No past surgeries      Family History  Problem Relation Age of Onset  . Heart disease Mother     hole in heart  . Cancer Maternal Grandmother     History  Substance Use Topics  . Smoking status: Current Some Day Smoker -- 0.25 packs/day for 6 years    Types: Cigarettes  . Smokeless tobacco: Never Used  . Alcohol Use: No    Allergies: No Known Allergies  Prescriptions prior to admission  Medication Sig Dispense Refill Last Dose  . ibuprofen (ADVIL,MOTRIN) 800 MG tablet Take 1 tablet (800 mg total) by mouth every 8 (eight) hours as needed for mild pain. (Patient not taking: Reported on 05/27/2015) 30 tablet 0 Not Taking at Unknown time  . metroNIDAZOLE (FLAGYL) 500 MG tablet Take 1 tablet (500 mg total) by mouth 2 (two) times daily. Do not drink alcohol with this medication. (Patient not taking: Reported on 05/27/2015) 14 tablet 0 Completed Course at Unknown time    Review of Systems  Constitutional: Negative.   HENT: Negative.   Respiratory: Negative.   Cardiovascular: Negative.   Gastrointestinal: Positive for abdominal pain and constipation.  Genitourinary: Negative.   Musculoskeletal: Negative.   Skin: Negative.   Neurological: Negative.   Endo/Heme/Allergies: Negative.   Psychiatric/Behavioral: Negative.    Physical Exam   Blood pressure 108/67, pulse 93, temperature 98.6 F (37 C), temperature source Oral, resp. rate  16, height  (1.6 m), weight 257 lb 6.4 oz (116.756 kg), last menstrual period 01/31/2015, SpO2 100 %.  Physical Exam  Constitutional: She is oriented to person, place, and time. She appears well-developed and well-nourished.  HENT:  Head: Normocephalic.  Eyes: Pupils are equal, round, and reactive to light.  Neck: Normal range of motion.  Cardiovascular: Normal rate, regular rhythm, normal heart sounds and intact distal pulses.   Respiratory: Effort normal and breath sounds normal.  GI: Soft. Bowel sounds are normal.  Genitourinary: Vagina normal and uterus normal.  Musculoskeletal: Normal range of motion.  Neurological: She is alert and oriented to person, place, and time. She has normal reflexes.  Skin: Skin is warm and dry.  Psychiatric: She has a normal mood and affect. Her behavior is normal. Judgment and thought content normal.    MAU Course  Procedures  MDM Early preg constipation   Assessment and Plan  FHr st and reg by doppler Will start on stool softners To start [facility] in planning to see Dr. Rochele Pages, [name] 05/27/2015, 4:40 PM

## 2015-05-27 NOTE — Discharge Instructions (Signed)

## 2015-05-27 NOTE — MAU Note (Signed)
States feels like she is being punched in the stomach, started this morning.

## 2015-05-27 NOTE — MAU Note (Signed)
Mid abdomen pain started this a.m., pt went for a walk & then began hurting.  Thought it was gas but had a BM.  Pain is no better.  Denies bleeding.

## 2015-05-27 NOTE — MAU Note (Signed)
Pt had neg UPT & BHCG <2 in May.  Pt states she had U/S in [place] right after that which showed she was pregnant, states she saw the baby.

## 2015-11-07 ENCOUNTER — Encounter (HOSPITAL_COMMUNITY): Payer: Self-pay

## 2015-11-07 ENCOUNTER — Inpatient Hospital Stay (HOSPITAL_COMMUNITY): Payer: Medicaid Other | Admitting: Anesthesiology

## 2015-11-07 ENCOUNTER — Inpatient Hospital Stay (HOSPITAL_COMMUNITY)
Admission: AD | Admit: 2015-11-07 | Discharge: 2015-11-10 | DRG: 765 | Disposition: A | Discharge status: Home / Self Care | Payer: Medicaid Other | Source: Ambulatory Visit | Attending: Obstetrics & Gynecology | Admitting: Obstetrics & Gynecology

## 2015-11-07 ENCOUNTER — Encounter (HOSPITAL_COMMUNITY)
Admission: AD | Disposition: A | Discharge status: Home / Self Care | Payer: Self-pay | Source: Ambulatory Visit | Attending: Obstetrics & Gynecology

## 2015-11-07 ENCOUNTER — Inpatient Hospital Stay (HOSPITAL_COMMUNITY): Payer: Medicaid Other

## 2015-11-07 DIAGNOSIS — O1494 Unspecified pre-eclampsia, complicating childbirth: Secondary | ICD-10-CM | POA: Diagnosis present

## 2015-11-07 DIAGNOSIS — Z6841 Body Mass Index (BMI) 40.0 and over, adult: Secondary | ICD-10-CM

## 2015-11-07 DIAGNOSIS — O99334 Smoking (tobacco) complicating childbirth: Secondary | ICD-10-CM | POA: Diagnosis present

## 2015-11-07 DIAGNOSIS — Z8249 Family history of ischemic heart disease and other diseases of the circulatory system: Secondary | ICD-10-CM

## 2015-11-07 DIAGNOSIS — O99214 Obesity complicating childbirth: Secondary | ICD-10-CM | POA: Diagnosis present

## 2015-11-07 DIAGNOSIS — O99324 Drug use complicating childbirth: Secondary | ICD-10-CM | POA: Diagnosis not present

## 2015-11-07 DIAGNOSIS — Z3A37 37 weeks gestation of pregnancy: Secondary | ICD-10-CM

## 2015-11-07 DIAGNOSIS — O368390 Maternal care for abnormalities of the fetal heart rate or rhythm, unspecified trimester, not applicable or unspecified: Secondary | ICD-10-CM

## 2015-11-07 DIAGNOSIS — F1290 Cannabis use, unspecified, uncomplicated: Secondary | ICD-10-CM | POA: Diagnosis present

## 2015-11-07 DIAGNOSIS — F17210 Nicotine dependence, cigarettes, uncomplicated: Secondary | ICD-10-CM | POA: Diagnosis present

## 2015-11-07 DIAGNOSIS — Z8759 Personal history of other complications of pregnancy, childbirth and the puerperium: Secondary | ICD-10-CM | POA: Diagnosis present

## 2015-11-07 LAB — COMPREHENSIVE METABOLIC PANEL
ALT: 12 U/L — AB (ref 14–54)
AST: 19 U/L (ref 15–41)
Albumin: 2.7 g/dL — ABNORMAL LOW (ref 3.5–5.0)
Alkaline Phosphatase: 206 U/L — ABNORMAL HIGH (ref 38–126)
Anion gap: 8 (ref 5–15)
BILIRUBIN TOTAL: 0.3 mg/dL (ref 0.3–1.2)
BUN: 10 mg/dL (ref 6–20)
CO2: 23 mmol/L (ref 22–32)
CREATININE: 0.81 mg/dL (ref 0.44–1.00)
Calcium: 8.7 mg/dL — ABNORMAL LOW (ref 8.9–10.3)
Chloride: 108 mmol/L (ref 101–111)
GFR calc Af Amer: >60 mL/min (ref >60)
Glucose, Bld: 87 mg/dL (ref 65–99)
POTASSIUM: 4.2 mmol/L (ref 3.5–5.1)
Sodium: 139 mmol/L (ref 135–145)
TOTAL PROTEIN: 6.4 g/dL — AB (ref 6.5–8.1)

## 2015-11-07 LAB — ABO/RH: ABO/RH(D): O POS

## 2015-11-07 LAB — HIV ANTIBODY (ROUTINE TESTING W REFLEX): HIV SCREEN 4TH GENERATION: NONREACTIVE

## 2015-11-07 LAB — CBC
HEMATOCRIT: 37 % (ref 36.0–46.0)
Hemoglobin: 12.4 g/dL (ref 12.0–15.0)
MCH: 28.4 pg (ref 26.0–34.0)
MCHC: 33.5 g/dL (ref 30.0–36.0)
MCV: 84.9 fL (ref 78.0–100.0)
Platelets: 304 10*3/uL (ref 150–400)
RBC: 4.36 MIL/uL (ref 3.87–5.11)
RDW: 16.3 % — AB (ref 11.5–15.5)
WBC: 14 10*3/uL — ABNORMAL HIGH (ref 4.0–10.5)

## 2015-11-07 LAB — PROTEIN / CREATININE RATIO, URINE
CREATININE, URINE: 19 mg/dL
Protein Creatinine Ratio: 2.21 mg/mg{Cre} — ABNORMAL HIGH (ref 0.00–0.15)
Total Protein, Urine: 42 mg/dL

## 2015-11-07 LAB — RAPID URINE DRUG SCREEN, HOSP PERFORMED
Amphetamines: NOT DETECTED
BENZODIAZEPINES: NOT DETECTED
Barbiturates: NOT DETECTED
Cocaine: NOT DETECTED
OPIATES: NOT DETECTED
Tetrahydrocannabinol: POSITIVE — AB

## 2015-11-07 LAB — HEPATITIS B SURFACE ANTIGEN: HEP B S AG: NEGATIVE

## 2015-11-07 LAB — TYPE AND SCREEN
ABO/RH(D): O POS
ANTIBODY SCREEN: NEGATIVE

## 2015-11-07 LAB — GROUP B STREP BY PCR: Group B strep by PCR: NEGATIVE

## 2015-11-07 LAB — RPR: RPR Ser Ql: NONREACTIVE

## 2015-11-07 SURGERY — Surgical Case
Anesthesia: Spinal | Site: Abdomen

## 2015-11-07 MED ORDER — BUPIVACAINE IN DEXTROSE 0.75-8.25 % IT SOLN
INTRATHECAL | Status: DC | PRN
  Administered 2015-11-07: 1.5 mL via INTRATHECAL

## 2015-11-07 MED ORDER — NALOXONE HCL 2 MG/2ML IJ SOSY
1.0000 ug/kg/h | PREFILLED_SYRINGE | INTRAVENOUS | Status: DC | PRN
  Filled 2015-11-07: qty 2

## 2015-11-07 MED ORDER — ONDANSETRON HCL 4 MG/2ML IJ SOLN: 4.0000 mg | Freq: Four times a day (QID) | INTRAMUSCULAR | Status: DC | PRN

## 2015-11-07 MED ORDER — IBUPROFEN 600 MG PO TABS: 600.0000 mg | ORAL_TABLET | Freq: Four times a day (QID) | ORAL | Status: DC | PRN

## 2015-11-07 MED ORDER — SENNOSIDES-DOCUSATE SODIUM 8.6-50 MG PO TABS
2.0000 | ORAL_TABLET | ORAL | Status: DC
  Administered 2015-11-08 – 2015-11-09 (×2): 2 via ORAL
  Filled 2015-11-07 (×2): qty 2

## 2015-11-07 MED ORDER — SODIUM CHLORIDE 0.9 % IJ SOLN
3.0000 mL | INTRAMUSCULAR | Status: DC | PRN
Start: 2015-11-07 — End: 2015-11-07

## 2015-11-07 MED ORDER — OXYTOCIN BOLUS FROM INFUSION: 500.0000 mL | INTRAVENOUS | Status: DC

## 2015-11-07 MED ORDER — BUPIVACAINE HCL (PF) 0.5 % IJ SOLN
INTRAMUSCULAR | Status: DC | PRN
  Administered 2015-11-07: 30 mL

## 2015-11-07 MED ORDER — ONDANSETRON HCL 4 MG/2ML IJ SOLN: 4.0000 mg | Freq: Three times a day (TID) | INTRAMUSCULAR | Status: DC | PRN

## 2015-11-07 MED ORDER — MEPERIDINE HCL 25 MG/ML IJ SOLN
INTRAMUSCULAR | Status: DC | PRN
  Administered 2015-11-07: 12.5 mg via INTRAVENOUS

## 2015-11-07 MED ORDER — MENTHOL 3 MG MT LOZG: 1.0000 | LOZENGE | OROMUCOSAL | Status: DC | PRN

## 2015-11-07 MED ORDER — LACTATED RINGERS IV SOLN
INTRAVENOUS | Status: DC | PRN
  Administered 2015-11-07: 12:00:00 via INTRAVENOUS

## 2015-11-07 MED ORDER — NALOXONE HCL 0.4 MG/ML IJ SOLN: 0.4000 mg | INTRAMUSCULAR | Status: DC | PRN

## 2015-11-07 MED ORDER — OXYTOCIN 10 UNIT/ML IJ SOLN
40.0000 [IU] | INTRAMUSCULAR | Status: DC | PRN
  Administered 2015-11-07: 40 [IU] via INTRAVENOUS

## 2015-11-07 MED ORDER — MEPERIDINE HCL 25 MG/ML IJ SOLN: 6.2500 mg | INTRAMUSCULAR | Status: DC | PRN

## 2015-11-07 MED ORDER — KETOROLAC TROMETHAMINE 30 MG/ML IJ SOLN
INTRAMUSCULAR | Status: AC
  Filled 2015-11-07: qty 1

## 2015-11-07 MED ORDER — KETOROLAC TROMETHAMINE 30 MG/ML IJ SOLN: 30.0000 mg | Freq: Four times a day (QID) | INTRAMUSCULAR | Status: DC

## 2015-11-07 MED ORDER — ONDANSETRON HCL 4 MG/2ML IJ SOLN
INTRAMUSCULAR | Status: AC
  Filled 2015-11-07: qty 2

## 2015-11-07 MED ORDER — WITCH HAZEL-GLYCERIN EX PADS: 1.0000 "application " | MEDICATED_PAD | CUTANEOUS | Status: DC | PRN

## 2015-11-07 MED ORDER — IBUPROFEN 600 MG PO TABS
600.0000 mg | ORAL_TABLET | Freq: Four times a day (QID) | ORAL | Status: DC
  Administered 2015-11-08 – 2015-11-10 (×11): 600 mg via ORAL
  Filled 2015-11-07 (×11): qty 1

## 2015-11-07 MED ORDER — SIMETHICONE 80 MG PO CHEW
80.0000 mg | CHEWABLE_TABLET | Freq: Three times a day (TID) | ORAL | Status: DC
  Administered 2015-11-08 – 2015-11-09 (×5): 80 mg via ORAL
  Filled 2015-11-07 (×6): qty 1

## 2015-11-07 MED ORDER — TETANUS-DIPHTH-ACELL PERTUSSIS 5-2.5-18.5 LF-MCG/0.5 IM SUSP: 0.5000 mL | Freq: Once | INTRAMUSCULAR | Status: DC

## 2015-11-07 MED ORDER — PROMETHAZINE HCL 25 MG/ML IJ SOLN: 6.2500 mg | INTRAMUSCULAR | Status: DC | PRN

## 2015-11-07 MED ORDER — DIPHENHYDRAMINE HCL 25 MG PO CAPS
25.0000 mg | ORAL_CAPSULE | Freq: Four times a day (QID) | ORAL | Status: DC | PRN
  Administered 2015-11-08 (×2): 25 mg via ORAL
  Filled 2015-11-07 (×2): qty 1

## 2015-11-07 MED ORDER — HYDROMORPHONE HCL 1 MG/ML IJ SOLN: 0.2500 mg | INTRAMUSCULAR | Status: DC | PRN

## 2015-11-07 MED ORDER — MORPHINE SULFATE (PF) 0.5 MG/ML IJ SOLN
INTRAMUSCULAR | Status: AC
  Filled 2015-11-07: qty 10

## 2015-11-07 MED ORDER — ONDANSETRON HCL 4 MG/2ML IJ SOLN
4.0000 mg | Freq: Four times a day (QID) | INTRAMUSCULAR | Status: DC | PRN
  Administered 2015-11-07: 4 mg via INTRAVENOUS
  Filled 2015-11-07: qty 2

## 2015-11-07 MED ORDER — DIPHENHYDRAMINE HCL 25 MG PO CAPS
25.0000 mg | ORAL_CAPSULE | ORAL | Status: DC | PRN
  Filled 2015-11-07: qty 1

## 2015-11-07 MED ORDER — SIMETHICONE 80 MG PO CHEW
80.0000 mg | CHEWABLE_TABLET | ORAL | Status: DC
Start: 2015-11-08 — End: 2015-11-10
  Administered 2015-11-08 – 2015-11-09 (×2): 80 mg via ORAL
  Filled 2015-11-07 (×2): qty 1

## 2015-11-07 MED ORDER — NALBUPHINE HCL 10 MG/ML IJ SOLN: 5.0000 mg | Freq: Once | INTRAMUSCULAR | Status: DC | PRN

## 2015-11-07 MED ORDER — LANOLIN HYDROUS EX OINT: 1.0000 "application " | TOPICAL_OINTMENT | CUTANEOUS | Status: DC | PRN

## 2015-11-07 MED ORDER — LACTATED RINGERS IV SOLN
INTRAVENOUS | Status: DC | PRN
  Administered 2015-11-07: 13:00:00 via INTRAVENOUS

## 2015-11-07 MED ORDER — SCOPOLAMINE 1 MG/3DAYS TD PT72: 1.0000 | MEDICATED_PATCH | Freq: Once | TRANSDERMAL | Status: DC

## 2015-11-07 MED ORDER — LACTATED RINGERS IV BOLUS (SEPSIS)
500.0000 mL | Freq: Once | INTRAVENOUS | Status: AC
  Administered 2015-11-07: 500 mL via INTRAVENOUS

## 2015-11-07 MED ORDER — OXYCODONE-ACETAMINOPHEN 5-325 MG PO TABS: 1.0000 | ORAL_TABLET | ORAL | Status: DC | PRN

## 2015-11-07 MED ORDER — ACETAMINOPHEN 325 MG PO TABS
650.0000 mg | ORAL_TABLET | ORAL | Status: DC | PRN
  Administered 2015-11-08: 650 mg via ORAL
  Filled 2015-11-07: qty 2

## 2015-11-07 MED ORDER — LIDOCAINE HCL (PF) 1 % IJ SOLN: 30.0000 mL | INTRAMUSCULAR | Status: DC | PRN

## 2015-11-07 MED ORDER — LACTATED RINGERS IV SOLN
500.0000 mL | INTRAVENOUS | Status: DC | PRN
  Administered 2015-11-07: 500 mL via INTRAVENOUS

## 2015-11-07 MED ORDER — ACETAMINOPHEN 500 MG PO TABS: 1000.0000 mg | ORAL_TABLET | Freq: Four times a day (QID) | ORAL | Status: DC

## 2015-11-07 MED ORDER — DIPHENHYDRAMINE HCL 50 MG/ML IJ SOLN: 12.5000 mg | INTRAMUSCULAR | Status: DC | PRN

## 2015-11-07 MED ORDER — FENTANYL CITRATE (PF) 100 MCG/2ML IJ SOLN: 100.0000 ug | INTRAMUSCULAR | Status: DC | PRN

## 2015-11-07 MED ORDER — OXYTOCIN 10 UNIT/ML IJ SOLN: 2.5000 [IU]/h | INTRAVENOUS | Status: AC

## 2015-11-07 MED ORDER — PHENYLEPHRINE 8 MG IN D5W 100 ML (0.08MG/ML) PREMIX OPTIME
INJECTION | INTRAVENOUS | Status: DC | PRN
  Administered 2015-11-07: 40 ug/min via INTRAVENOUS

## 2015-11-07 MED ORDER — KETOROLAC TROMETHAMINE 30 MG/ML IJ SOLN
30.0000 mg | Freq: Four times a day (QID) | INTRAMUSCULAR | Status: DC | PRN
  Administered 2015-11-07: 30 mg via INTRAMUSCULAR

## 2015-11-07 MED ORDER — OXYTOCIN 10 UNIT/ML IJ SOLN: 2.5000 [IU]/h | INTRAVENOUS | Status: DC

## 2015-11-07 MED ORDER — OXYTOCIN 10 UNIT/ML IJ SOLN: 1.0000 m[IU]/min | INTRAMUSCULAR | Status: DC

## 2015-11-07 MED ORDER — BUPIVACAINE-EPINEPHRINE (PF) 0.5% -1:200000 IJ SOLN
INTRAMUSCULAR | Status: AC
  Filled 2015-11-07: qty 30

## 2015-11-07 MED ORDER — ZOLPIDEM TARTRATE 5 MG PO TABS: 5.0000 mg | ORAL_TABLET | Freq: Every evening | ORAL | Status: DC | PRN

## 2015-11-07 MED ORDER — MORPHINE SULFATE (PF) 0.5 MG/ML IJ SOLN
INTRAMUSCULAR | Status: DC | PRN
  Administered 2015-11-07: .2 mg via INTRATHECAL

## 2015-11-07 MED ORDER — FENTANYL CITRATE (PF) 100 MCG/2ML IJ SOLN
INTRAMUSCULAR | Status: AC
  Filled 2015-11-07: qty 2

## 2015-11-07 MED ORDER — MEASLES, MUMPS & RUBELLA VAC ~~LOC~~ INJ: 0.5000 mL | INJECTION | Freq: Once | SUBCUTANEOUS | Status: DC

## 2015-11-07 MED ORDER — LACTATED RINGERS IV SOLN: INTRAVENOUS | Status: DC

## 2015-11-07 MED ORDER — OXYCODONE-ACETAMINOPHEN 5-325 MG PO TABS: 2.0000 | ORAL_TABLET | ORAL | Status: DC | PRN

## 2015-11-07 MED ORDER — FENTANYL CITRATE (PF) 100 MCG/2ML IJ SOLN
INTRAMUSCULAR | Status: DC | PRN
  Administered 2015-11-07: 20 ug via INTRATHECAL

## 2015-11-07 MED ORDER — ONDANSETRON HCL 4 MG/2ML IJ SOLN
INTRAMUSCULAR | Status: DC | PRN
  Administered 2015-11-07: 4 mg via INTRAVENOUS

## 2015-11-07 MED ORDER — PRENATAL MULTIVITAMIN CH
1.0000 | ORAL_TABLET | Freq: Every day | ORAL | Status: DC
Start: 2015-11-08 — End: 2015-11-10
  Administered 2015-11-08 – 2015-11-10 (×3): 1 via ORAL
  Filled 2015-11-07 (×3): qty 1

## 2015-11-07 MED ORDER — OXYTOCIN 10 UNIT/ML IJ SOLN
INTRAMUSCULAR | Status: AC
  Filled 2015-11-07: qty 4

## 2015-11-07 MED ORDER — MISOPROSTOL 25 MCG QUARTER TABLET
25.0000 ug | ORAL_TABLET | ORAL | Status: DC
  Administered 2015-11-07: 25 ug via VAGINAL
  Filled 2015-11-07 (×4): qty 1
  Filled 2015-11-07: qty 0.25
  Filled 2015-11-07 (×2): qty 1

## 2015-11-07 MED ORDER — SIMETHICONE 80 MG PO CHEW: 80.0000 mg | CHEWABLE_TABLET | ORAL | Status: DC | PRN

## 2015-11-07 MED ORDER — MEPERIDINE HCL 25 MG/ML IJ SOLN
INTRAMUSCULAR | Status: AC
Start: 2015-11-07 — End: 2015-11-07
  Filled 2015-11-07: qty 1

## 2015-11-07 MED ORDER — DEXTROSE 5 % IV SOLN
3.0000 g | INTRAVENOUS | Status: DC | PRN
  Administered 2015-11-07: 3 g via INTRAVENOUS

## 2015-11-07 MED ORDER — NALBUPHINE HCL 10 MG/ML IJ SOLN: 5.0000 mg | INTRAMUSCULAR | Status: DC | PRN

## 2015-11-07 MED ORDER — OXYCODONE-ACETAMINOPHEN 5-325 MG PO TABS
2.0000 | ORAL_TABLET | ORAL | Status: DC | PRN
  Administered 2015-11-08 – 2015-11-10 (×8): 2 via ORAL
  Filled 2015-11-07 (×8): qty 2

## 2015-11-07 MED ORDER — KETOROLAC TROMETHAMINE 30 MG/ML IJ SOLN: 30.0000 mg | Freq: Four times a day (QID) | INTRAMUSCULAR | Status: DC | PRN

## 2015-11-07 MED ORDER — BUPIVACAINE HCL (PF) 0.5 % IJ SOLN
INTRAMUSCULAR | Status: AC
  Filled 2015-11-07: qty 30

## 2015-11-07 MED ORDER — TERBUTALINE SULFATE 1 MG/ML IJ SOLN
0.2500 mg | Freq: Once | INTRAMUSCULAR | Status: AC | PRN
  Administered 2015-11-07: 0.25 mg via SUBCUTANEOUS
  Filled 2015-11-07: qty 1

## 2015-11-07 MED ORDER — DIBUCAINE 1 % RE OINT: 1.0000 "application " | TOPICAL_OINTMENT | RECTAL | Status: DC | PRN

## 2015-11-07 MED ORDER — ACETAMINOPHEN 325 MG PO TABS: 650.0000 mg | ORAL_TABLET | ORAL | Status: DC | PRN

## 2015-11-07 MED ORDER — CITRIC ACID-SODIUM CITRATE 334-500 MG/5ML PO SOLN
30.0000 mL | ORAL | Status: DC | PRN
  Administered 2015-11-07: 30 mL via ORAL
  Filled 2015-11-07: qty 15

## 2015-11-07 MED ORDER — LACTATED RINGERS IV SOLN
INTRAVENOUS | Status: DC
  Administered 2015-11-07: 125 mL/h via INTRAVENOUS

## 2015-11-07 SURGICAL SUPPLY — 40 items
APL SKNCLS STERI-STRIP NONHPOA (GAUZE/BANDAGES/DRESSINGS) ×1
BENZOIN TINCTURE PRP APPL 2/3 (GAUZE/BANDAGES/DRESSINGS) ×3 IMPLANT
BLADE TIP J-PLASMA PRECISE LAP (MISCELLANEOUS) ×3 IMPLANT
CLAMP CORD UMBIL (MISCELLANEOUS) IMPLANT
CLOSURE WOUND 1/2 X4 (GAUZE/BANDAGES/DRESSINGS) ×1
CLOTH BEACON ORANGE TIMEOUT ST (SAFETY) ×3 IMPLANT
DRAPE SHEET LG 3/4 BI-LAMINATE (DRAPES) IMPLANT
DRSG OPSITE POSTOP 4X10 (GAUZE/BANDAGES/DRESSINGS) ×3 IMPLANT
DURAPREP 26ML APPLICATOR (WOUND CARE) ×3 IMPLANT
ELECT REM PT RETURN 9FT ADLT (ELECTROSURGICAL) ×3
ELECTRODE REM PT RTRN 9FT ADLT (ELECTROSURGICAL) ×1 IMPLANT
EXTRACTOR VACUUM KIWI (MISCELLANEOUS) IMPLANT
GLOVE BIO SURGEON STRL SZ7 (GLOVE) ×3 IMPLANT
GLOVE BIOGEL PI IND STRL 7.0 (GLOVE) ×2 IMPLANT
GLOVE BIOGEL PI INDICATOR 7.0 (GLOVE) ×4
GOWN STRL REUS W/TWL LRG LVL3 (GOWN DISPOSABLE) ×6 IMPLANT
GOWN STRL REUS W/TWL XL LVL3 (GOWN DISPOSABLE) ×3 IMPLANT
KIT ABG SYR 3ML LUER SLIP (SYRINGE) IMPLANT
NDL HYPO 25X5/8 SAFETYGLIDE (NEEDLE) IMPLANT
NEEDLE HYPO 22GX1.5 SAFETY (NEEDLE) ×3 IMPLANT
NEEDLE HYPO 25X5/8 SAFETYGLIDE (NEEDLE) IMPLANT
NS IRRIG 1000ML POUR BTL (IV SOLUTION) ×3 IMPLANT
PACK C SECTION WH (CUSTOM PROCEDURE TRAY) ×3 IMPLANT
PAD ABD 8X10 STRL (GAUZE/BANDAGES/DRESSINGS) ×2 IMPLANT
PAD OB MATERNITY 4.3X12.25 (PERSONAL CARE ITEMS) ×3 IMPLANT
PENCIL SMOKE EVAC W/HOLSTER (ELECTROSURGICAL) ×3 IMPLANT
RTRCTR C-SECT PINK 25CM LRG (MISCELLANEOUS) IMPLANT
SPONGE SURGIFOAM ABS GEL 12-7 (HEMOSTASIS) IMPLANT
STERI STRIP BROWN 1/4X3 R155 (GAUZE/BANDAGES/DRESSINGS) ×2 IMPLANT
STRIP CLOSURE SKIN 1/2X4 (GAUZE/BANDAGES/DRESSINGS) ×2 IMPLANT
SUT PDS AB 0 CTX 60 (SUTURE) IMPLANT
SUT PLAIN 0 NONE (SUTURE) IMPLANT
SUT SILK 0 TIES 10X30 (SUTURE) IMPLANT
SUT VIC AB 0 CT1 36 (SUTURE) ×11 IMPLANT
SUT VIC AB 3-0 CT1 27 (SUTURE) ×3
SUT VIC AB 3-0 CT1 TAPERPNT 27 (SUTURE) ×1 IMPLANT
SUT VIC AB 4-0 KS 27 (SUTURE) ×2 IMPLANT
SYR CONTROL 10ML LL (SYRINGE) ×3 IMPLANT
TOWEL OR 17X24 6PK STRL BLUE (TOWEL DISPOSABLE) ×3 IMPLANT
TRAY FOLEY CATH SILVER 14FR (SET/KITS/TRAYS/PACK) ×3 IMPLANT

## 2015-11-07 NOTE — Transfer of Care (Signed)
Immediate Anesthesia Transfer of Care Note  Patient: Carla Carson  Procedure(s) Performed: Procedure(s): CESAREAN SECTION (N/A)  Patient Location: PACU  Anesthesia Type:Spinal  Level of Consciousness: awake, alert , oriented and patient cooperative  Airway & Oxygen Therapy: Patient Spontanous Breathing  Post-op Assessment: Report given to RN and Post -op Vital signs reviewed and stable  Post vital signs: Reviewed and stable  Last Vitals:  Filed Vitals:   11/07/15 1131 11/07/15 1202  BP: 133/75 148/96  Pulse: 72 81  Temp:    Resp:      Complications: No apparent anesthesia complications

## 2015-11-07 NOTE — Consult Note (Signed)
[facility]  Delivery Note:  C-section       11/07/2015  12:59 PM  I was called to the operating room at the request of the patient's obstetrician (Dr. Dolan AmenHarroway-Smith) for a primary c-section.  PRENATAL HX:  30 y/o G3P2002 at 2237 and 5/7 weeks, admitted this morning for IOL due to pre-eclampsia.  However, with induction infant began to have decelerations so delivery was by c-section.   AROM was today at 1150, less than 1 hour prior to delivery.    DELIVERY:  Infant was vigorous at delivery, requiring no resuscitation other than standard warming, drying and stimulation.  APGARs 9 and 9.  Infant SGA on exam, otherwise exam within normal limits.  After 5 minutes, baby left with nurse to assist parents with skin-to-skin care.   _____________________ Electronically Signed By: Maryan CharLindsey Karter Haire, MD Neonatologist

## 2015-11-07 NOTE — Progress Notes (Signed)
CNM Notified of prolonged decel x 2 min, resolved. Per RN pt in sleeping on left side. Asked her to try position change and fluid bolus. Pt has had 4 1-2 minute decels since 0900. Dr. Erin FullingHarraway-Zerenity Bowron notified. Will CTO closely.   [place] Kerston Landeck, [place] 11/07/2015 3:54 PM

## 2015-11-07 NOTE — Anesthesia Postprocedure Evaluation (Signed)
Anesthesia Post Note  Patient: Carla Carson  Procedure(s) Performed: Procedure(s) (LRB): CESAREAN SECTION (N/A)  Patient location during evaluation: PACU Anesthesia Type: Spinal Level of consciousness: awake Pain management: pain level controlled Vital Signs Assessment: post-procedure vital signs reviewed and stable Respiratory status: spontaneous breathing Cardiovascular status: stable Postop Assessment: no headache, no backache, spinal receding, patient able to bend at knees and no signs of nausea or vomiting Anesthetic complications: no    Last Vitals:  Filed Vitals:   11/07/15 1345 11/07/15 1400  BP: 124/82 128/87  Pulse: 64 60  Temp:    Resp: 14 11    Last Pain:  Filed Vitals:   11/07/15 1404  PainSc: Asleep                 Gillis Boardley JR,JOHN Iysis Germain

## 2015-11-07 NOTE — H&P (Signed)
Carla Carson is a 30 y.o. female G3P2002 @ 37.5wks per pt by U/S in [facility] presenting initially to MAU by EMS for labor eval. No leaking or bldg. Denies H/A, N/V or visual disturbances. No RUQ pain. She has had a single PN visit w/ Dr Gaynell Face and none since. Her preg has been remarkable for 1) term SVD x 2 2) hx STDs  History OB History    Gravida Para Term Preterm AB TAB SAB Ectopic Multiple Living   3 2 2  0 0 0 0 0 0 2     Past Medical History  Diagnosis Date  . No pertinent past medical history   . Infection     UTI  . Ovarian cyst   . Gonorrhea   . Chlamydia   . Syphilis    Past Surgical History  Procedure Laterality Date  . No past surgeries     Family History: family history includes Cancer in her maternal grandmother; Heart disease in her mother. Social History:  reports that she has been smoking Cigarettes.  She has a 1.5 pack-year smoking history. She has never used smokeless tobacco. She reports that she uses illicit drugs (Marijuana). She reports that she does not drink alcohol.   Prenatal Transfer Tool  Maternal Diabetes: No- not done Genetic Screening: Declined Maternal Ultrasounds/Referrals: Declined Fetal Ultrasounds or other Referrals:  None Maternal Substance Abuse:  Yes:  Type: Marijuana Significant Maternal Medications:  None Significant Maternal Lab Results:  Lab values include: Other:  GBS pending Other Comments:  None  ROS  Dilation: 2.5 Effacement (%): 50 Station: -2, -3 Exam by:: D Simpson RN Blood pressure 134/77, pulse 67, temperature 98.3 F (36.8 C), resp. rate 18, last menstrual period 01/31/2015, SpO2 100 %.  BPs: 150/95, 160/100, 132/81, 129/76, 103/66  Exam Physical Exam  Constitutional: She is oriented to person, place, and time. She appears well-developed.  HENT:  Head: Normocephalic.  Neck: Normal range of motion.  Cardiovascular: Normal rate.   Respiratory: Effort normal.  GI:  EFM 120s, +accels, decels x 2 to nadir of  60 w/ spont recovery; Cat 1 tracing for >1hr afterward  Irreg ctx  Musculoskeletal: Normal range of motion.  Neurological: She is alert and oriented to person, place, and time.  Skin: Skin is warm and dry.  Psychiatric: She has a normal mood and affect. Her behavior is normal. Thought content normal.   U/S: BPP 8/8, nl AFI  CBC    Component Value Date/Time   WBC 14.0* 11/07/2015 0228   RBC 4.36 11/07/2015 0228   HGB 12.4 11/07/2015 0228   HCT 37.0 11/07/2015 0228   PLT 304 11/07/2015 0228   MCV 84.9 11/07/2015 0228   MCH 28.4 11/07/2015 0228   MCHC 33.5 11/07/2015 0228   RDW 16.3* 11/07/2015 0228   LYMPHSABS 4.5* 03/16/2015 2152   MONOABS 0.9 03/16/2015 2152   EOSABS 0.1 03/16/2015 2152   BASOSABS 0.0 03/16/2015 2152   CMP     Component Value Date/Time   NA 139 11/07/2015 0228   K 4.2 11/07/2015 0228   CL 108 11/07/2015 0228   CO2 23 11/07/2015 0228   GLUCOSE 87 11/07/2015 0228   BUN 10 11/07/2015 0228   CREATININE 0.81 11/07/2015 0228   CALCIUM 8.7* 11/07/2015 0228   PROT 6.4* 11/07/2015 0228   ALBUMIN 2.7* 11/07/2015 0228   AST 19 11/07/2015 0228   ALT 12* 11/07/2015 0228   ALKPHOS 206* 11/07/2015 0228   BILITOT 0.3 11/07/2015 0228   GFRNONAA >  60 11/07/2015 0228   GFRAA >60 11/07/2015 0228   Urine P/C ratio: 2.21  Prenatal labs: (all blanks pending) ABO, Rh:   Antibody:   Rubella:   RPR: Non Reactive (05/21 2310)  HBsAg:    HIV: Non Reactive (05/21 2310)  GBS:     Assessment/Plan: IUP@37 .5wks Preeclampsia No PNC Cx favorable  [facility] Plan Pitocin for induction Begin mag sulfate if indicated by BPs or features GBS PCR pending   SHAW, KIMBERLY CNM 11/07/2015, 5:47 AM  GBS PCR resulted:: negative  SHAW, KIMBERLY 11/07/2015 9:17 AM

## 2015-11-07 NOTE — Progress Notes (Signed)
Carla Carson is a 30 y.o. G3P2002 at 5930w5d by ultrasound admitted for induction of labor due to Pre-eclamptic toxemia of pregnancy. Pt with no prenatal care. Presented to r/o labor and was noted to have elevated BP's and an elevated Pr:Cr ration   Subjective: Pt with no complaints.  She has a Cat II FHR remote from delivery which worsened after receiving cycotec.  S/p terbutaline.  FHR back to 117.  Objective: BP 121/83 mmHg  Pulse 80  Temp(Src) 98.1 F (36.7 C) (Oral)  Resp 16  Ht 5\' 3"  (1.6 m)  Wt 265 lb (120.203 kg)  BMI 46.95 kg/m2  SpO2 100%  LMP 01/31/2015 (Approximate)      FHT:  FHR: 117 bpm, variability: minimal ,  accelerations:  Abscent,  decelerations:  Present intermittent and worse after cytotec. Resolved with Terbutaline.     UC:   irregular SVE:   Dilation: 3 Effacement (%): Thick Station: -3 Exam by:: Ivonne AndrewV. Smith, CNM  Labs: Lab Results  Component Value Date   WBC 14.0* 11/07/2015   HGB 12.4 11/07/2015   HCT 37.0 11/07/2015   MCV 84.9 11/07/2015   PLT 304 11/07/2015    Assessment / Plan: Induction of labor due to preeclampsia,  progressing well on pitocin  Labor: not in active labor.  unable to tolerate cytotec Preeclampsia:  on magnesium sulfate, no signs or symptoms of toxicity and intake and ouput balanced Fetal Wellbeing:  Category II Pain Control:  Labor support without medications Anticipated MOD:  will proceed to operatvie delivery.  Patient desires surgical management with primary cesarean section.  The risks of surgery were discussed in detail with the patient including but not limited to: bleeding which may require transfusion or reoperation; infection which may require prolonged hospitalization or re-hospitalization and antibiotic therapy; injury to bowel, bladder, ureters and major vessels or other surrounding organs; need for additional procedures including laparotomy; thromboembolic phenomenon, incisional problems and other postoperative or  anesthesia complications.  Patient was told that the likelihood that her condition and symptoms will be treated effectively with this surgical management was very high; the postoperative expectations were also discussed in detail. The patient also understands the alternative treatment options which were discussed in full. All questions were answered.  She agrees with plan to proceed with operative delivery.  HARRAWAY-SMITH, Laverne Klugh 11/07/2015, 12:05 PM

## 2015-11-07 NOTE — Op Note (Signed)
11/07/2015  1:50 PM  PATIENT:  Carla Carson  30 y.o. female  PRE-OPERATIVE DIAGNOSIS:  non reassuring fhr  POST-OPERATIVE DIAGNOSIS:  non reassuring fhr  PROCEDURE:  Procedure(s): CESAREAN SECTION (N/A)  SURGEON:  Surgeon(s) and Role:    * Willodean Rosenthal, MD - Primary  ANESTHESIA:   spinal  EBL:  Total I/O In: 1500 [I.V.:1500] Out: 1450 [Urine:750; Blood:700]  BLOOD ADMINISTERED:none  DRAINS: none   LOCAL MEDICATIONS USED:  MARCAINE     SPECIMEN:  Source of Specimen:  placenta to pathology  DISPOSITION OF SPECIMEN:  PATHOLOGY  COUNTS:  YES  TOURNIQUET:  * No tourniquets in log *  DICTATION: .Note written in EPIC  PLAN OF CARE: Admit to inpatient   PATIENT DISPOSITION:  PACU - hemodynamically stable.   Delay start of Pharmacological VTE agent (>24hrs) due to surgical blood loss or risk of bleeding: yes  Complications: None immediate  INDICATIONS: Carla Carson is a 30 y.o. G3P2002 at [redacted]w[redacted]d here for cesarean section secondary to the indications listed under preoperative diagnosis; please see preoperative note for further details.  The risks of cesarean section were discussed with the patient including but were not limited to: bleeding which may require transfusion or reoperation; infection which may require antibiotics; injury to bowel, bladder, ureters or other surrounding organs; injury to the fetus; need for additional procedures including hysterectomy in the event of a life-threatening hemorrhage; placental abnormalities wth subsequent pregnancies, incisional problems, thromboembolic phenomenon and other postoperative/anesthesia complications.   The patient concurred with the proposed plan, giving informed written consent for the procedure.    FINDINGS:  Viable female infant in cephalic presentation.  Apgars pending.  Clear amniotic fluid.  Intact placenta- small, three vessel cord with true know in cord.  Normal uterus, fallopian tubes and ovaries  bilaterally.  PROCEDURE IN DETAIL:  The patient preoperatively received intravenous antibiotics and had sequential compression devices applied to her lower extremities.  She was then taken to the operating room where spinal anesthesia was administered and was found to be adequate. She was then placed in a dorsal supine position with a leftward tilt, and prepped and draped in a sterile manner.  A foley catheter was placed into her bladder and attached to constant gravity.  After an adequate timeout was performed, a Pfannenstiel skin incision was made with scalpel and carried through to the underlying layer of fascia. The fascia was incised in the midline, and this incision was extended bilaterally using the Mayo scissors.  Kocher clamps were applied to the superior aspect of the fascial incision and the underlying rectus muscles were dissected off bluntly. A similar process was carried out on the inferior aspect of the fascial incision. The rectus muscles were separated in the midline bluntly and the peritoneum was entered bluntly.  Attention was turned to the lower uterine segment where a low transverse hysterotomy incision was made with a scalpel and extended bilaterally bluntly.  The infant was successfully delivered, the cord was clamped and cut and the infant was handed over to awaiting neonatology team. The placenta was delivered manually. Uterine massage was then administered.  The placenta was intact with a three-vessel cord that contained a true knot. The uterus was then cleared of clot and debris.  The hysterotomy was closed with 0 Vicryl in a running locked fashion, and an imbricating layer was also placed with the same suture. The uterus was returned to the pelvis. The pelvis was cleared of all clot and debris. Hemostasis was confirmed on  all surfaces.  The peritoneum and the muscles were reapproximated using 0 Vicryl with 1 interrupted suture. The fascia was then closed using 0 Vicryl.  The  subcutaneous layer was irrigated, then reapproximated with 3-0 vicryl in a running locked fashion, and the skin was closed with a 4-0 Vicryl subcuticular stitch.  30 cc of 0.5% marcaine was injected into the incision and benzoin and steristrips were applied.  The patient tolerated the procedure well. Sponge, lap, instrument and needle counts were correct x 2.  She was taken to the recovery room in stable condition.   Carla Carson, M.D., Evern CoreFACOG

## 2015-11-07 NOTE — Progress Notes (Signed)
UR chart review completed.  

## 2015-11-07 NOTE — Progress Notes (Signed)
[facility]. FHR still down. Dr. Erin FullingHarraway-Zayvier Caravello [facility]. Difficult to assess FHR. IFSE placed. AROM clear. Terb given. FHR returned to baseline of 120 after 8 min decel. Dr. Erin FullingHarraway-Maciej Schweitzer Discussing concern about repetitive decels remote from delivery. Recommends C/S. R/B/I discussed. Pt gives consent. Prep OR.   [name], [place] 11/07/2015 3:58 PM

## 2015-11-07 NOTE — Brief Op Note (Signed)
11/07/2015  1:50 PM  PATIENT:  Carla Carson  30 y.o. female  PRE-OPERATIVE DIAGNOSIS:  non reassuring fhr  POST-OPERATIVE DIAGNOSIS:  non reassuring fhr  PROCEDURE:  Procedure(s): CESAREAN SECTION (N/A)  SURGEON:  Surgeon(s) and Role:    * Willodean Rosenthalarolyn Harraway-Smith, MD - Primary  ANESTHESIA:   spinal  EBL:  Total I/O In: 1500 [I.V.:1500] Out: 1450 [Urine:750; Blood:700]  BLOOD ADMINISTERED:none  DRAINS: none   LOCAL MEDICATIONS USED:  MARCAINE     SPECIMEN:  Source of Specimen:  placenta to pathology  DISPOSITION OF SPECIMEN:  PATHOLOGY  COUNTS:  YES  TOURNIQUET:  * No tourniquets in log *  DICTATION: .Note written in EPIC  PLAN OF CARE: Admit to inpatient   PATIENT DISPOSITION:  PACU - hemodynamically stable.   Delay start of Pharmacological VTE agent (>24hrs) due to surgical blood loss or risk of bleeding: yes  Complications: None immediate  Cadence Haslam L. Harraway-Smith, M.D., Evern CoreFACOG

## 2015-11-07 NOTE — Progress Notes (Signed)
Patient ID: Carla Carson Floor, female   DOB: 08/30/1986, 30 y.o.   MRN: [id number] Carla Carson Saffo is a 30 y.o. G3P3003 at 1637w5d.  Subjective: Mild, irreg UC's  Objective: BP 120/81 mmHg  Pulse 63  Temp(Src) 97.6 F (36.4 C) (Oral)  Resp 16  Ht 5\' 3"  (1.6 m)  Wt 265 lb (120.203 kg)  BMI 46.95 kg/m2  SpO2 100%  LMP 01/31/2015 (Approximate)  Breastfeeding? Unknown   FHT:  FHR: 125 bpm, variability: mod,  accelerations:  10x10,  decelerations:  Few mild variabales UC:   Rare, mild  Dilation: 3 Effacement (%): Thick Cervical Position: Anterior Station: -3 Presentation: Vertex Exam by:: Ivonne AndrewV. Burkley Dech, CNM  Labs: Results for orders placed or performed during the hospital encounter of 11/07/15 (from the past 24 hour(s))  CBC     Status: Abnormal   Collection Time: 11/07/15  2:28 AM  Result Value Ref Range   WBC 14.0 (H) 4.0 - 10.5 K/uL   RBC 4.36 3.87 - 5.11 MIL/uL   Hemoglobin 12.4 12.0 - 15.0 g/dL   HCT 65.737.0 84.636.0 - 96.246.0 %   MCV 84.9 78.0 - 100.0 fL   MCH 28.4 26.0 - 34.0 pg   MCHC 33.5 30.0 - 36.0 g/dL   RDW 95.216.3 (H) 84.111.5 - 32.415.5 %   Platelets 304 150 - 400 K/uL  Comprehensive metabolic panel     Status: Abnormal   Collection Time: 11/07/15  2:28 AM  Result Value Ref Range   Sodium 139 135 - 145 mmol/L   Potassium 4.2 3.5 - 5.1 mmol/L   Chloride 108 101 - 111 mmol/L   CO2 23 22 - 32 mmol/L   Glucose, Bld 87 65 - 99 mg/dL   BUN 10 6 - 20 mg/dL   Creatinine, Ser 4.010.81 0.44 - 1.00 mg/dL   Calcium 8.7 (L) 8.9 - 10.3 mg/dL   Total Protein 6.4 (L) 6.5 - 8.1 g/dL   Albumin 2.7 (L) 3.5 - 5.0 g/dL   AST 19 15 - 41 U/L   ALT 12 (L) 14 - 54 U/L   Alkaline Phosphatase 206 (H) 38 - 126 U/L   Total Bilirubin 0.3 0.3 - 1.2 mg/dL   GFR calc non Af Amer >60 >60 mL/min   GFR calc Af Amer >60 >60 mL/min   Anion gap 8 5 - 15  Type and screen [facility]     Status: None   Collection Time: 11/07/15  2:28 AM  Result Value Ref Range   ABO/RH(D) O POS    Antibody Screen NEG     Sample Expiration 11/10/2015   Urine rapid drug screen (hosp performed)     Status: Abnormal   Collection Time: 11/07/15  2:53 AM  Result Value Ref Range   Opiates NONE DETECTED NONE DETECTED   Cocaine NONE DETECTED NONE DETECTED   Benzodiazepines NONE DETECTED NONE DETECTED   Amphetamines NONE DETECTED NONE DETECTED   Tetrahydrocannabinol POSITIVE (A) NONE DETECTED   Barbiturates NONE DETECTED NONE DETECTED  Protein / creatinine ratio, urine     Status: Abnormal   Collection Time: 11/07/15  2:53 AM  Result Value Ref Range   Creatinine, Urine 19.00 mg/dL   Total Protein, Urine 42 mg/dL   Protein Creatinine Ratio 2.21 (H) 0.00 - 0.15 mg/mg[Cre]  Group B strep by PCR     Status: None   Collection Time: 11/07/15  5:07 AM  Result Value Ref Range   Group B strep by PCR NEGATIVE NEGATIVE  RPR  Status: None   Collection Time: 11/07/15  6:35 AM  Result Value Ref Range   RPR Ser Ql Non Reactive Non Reactive  HIV antibody (routine testing)     Status: None   Collection Time: 11/07/15  6:35 AM  Result Value Ref Range   HIV Screen 4th Generation wRfx Non Reactive Non Reactive    Assessment / Plan: [redacted]w[redacted]d week IUP Labor: Prodromal Fetal Wellbeing:  Category I Pain Control:  None Anticipated MOD:  SVD Cytotec  [place], CNM 11/07/2015 9:00 AM

## 2015-11-07 NOTE — Anesthesia Procedure Notes (Signed)
Spinal Patient location during procedure: OR Start time: 11/07/2015 12:10 PM End time: 11/07/2015 12:20 PM Staffing Anesthesiologist: Leilani AbleHATCHETT, Gala Padovano Performed by: anesthesiologist  Preanesthetic Checklist Completed: patient identified, surgical consent, pre-op evaluation, timeout performed, IV checked, risks and benefits discussed and monitors and equipment checked Spinal Block Patient position: sitting Prep: DuraPrep Patient monitoring: heart rate, cardiac monitor, continuous pulse ox and blood pressure Approach: midline Location: L3-4 Injection technique: single-shot Needle Needle type: Sprotte  Needle gauge: 24 G Needle length: 9 cm Needle insertion depth: 9 cm Assessment Sensory level: T6 Additional Notes Needed 17G Touhy X 2 to 7cm followed 24G 150mm Sprotte to + CSF.

## 2015-11-07 NOTE — Progress Notes (Signed)
CSW contacted by CPS worker, Toniyah Dilmore Bowan ([phone]), who reported that she is currently involved with the MOB and her other children.    CPS worker inquired about MOB's plans for discharge, and requested to remain informed during MOB and infant's admission.    CSW consulted with RN in L&D in order to receive update since L&D placed consult due to MOB reporting current CPS involvement and having limited access to housing resources that will be approved by CPS. CSW also noted that MOB's UDS was +THC on 1/12.    CSW will complete full assessment with MOB on 1/13 and will remain in contact with CPS during the admission.   Andron Marrazzo MSW, LCSW [phone] 

## 2015-11-07 NOTE — MAU Note (Signed)
Pt c/o contractions since midnight-every 2-3 mins. Denies LOF or vag bleeding. +FM.

## 2015-11-07 NOTE — Anesthesia Preprocedure Evaluation (Signed)
Anesthesia Evaluation  Patient identified by MRN, date of birth, ID band Patient awake    Reviewed: Allergy & Precautions, H&P , NPO status , Patient's Chart, lab work & pertinent test results  Airway Mallampati: II  TM Distance: >3 FB Neck ROM: full    Dental no notable dental hx.    Pulmonary Current Smoker,    Pulmonary exam normal        Cardiovascular negative cardio ROS Normal cardiovascular exam     Neuro/Psych negative neurological ROS  negative psych ROS   GI/Hepatic negative GI ROS, Neg liver ROS,   Endo/Other  negative endocrine ROSMorbid obesity  Renal/GU negative Renal ROS     Musculoskeletal   Abdominal (+) + obese,   Peds  Hematology negative hematology ROS (+)   Anesthesia Other Findings   Reproductive/Obstetrics (+) Pregnancy                             Anesthesia Physical Anesthesia Plan  ASA: III  Anesthesia Plan: Spinal   Post-op Pain Management:    Induction:   Airway Management Planned:   Additional Equipment:   Intra-op Plan:   Post-operative Plan:   Informed Consent: I have reviewed the patients History and Physical, chart, labs and discussed the procedure including the risks, benefits and alternatives for the proposed anesthesia with the patient or authorized representative who has indicated his/her understanding and acceptance.     Plan Discussed with: CRNA and Surgeon  Anesthesia Plan Comments:         Anesthesia Quick Evaluation

## 2015-11-08 ENCOUNTER — Encounter (HOSPITAL_COMMUNITY): Payer: Self-pay | Admitting: Obstetrics & Gynecology

## 2015-11-08 LAB — CBC
HCT: 34.3 % — ABNORMAL LOW (ref 36.0–46.0)
Hemoglobin: 11.2 g/dL — ABNORMAL LOW (ref 12.0–15.0)
MCH: 28.1 pg (ref 26.0–34.0)
MCHC: 32.7 g/dL (ref 30.0–36.0)
MCV: 86.2 fL (ref 78.0–100.0)
PLATELETS: 273 10*3/uL (ref 150–400)
RBC: 3.98 MIL/uL (ref 3.87–5.11)
RDW: 16.5 % — AB (ref 11.5–15.5)
WBC: 12.8 10*3/uL — AB (ref 4.0–10.5)

## 2015-11-08 LAB — RUBELLA SCREEN: Rubella: 1.32 index (ref >0.99)
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
## 2015-11-08 NOTE — Anesthesia Postprocedure Evaluation (Signed)
Anesthesia Post Note  Patient: Mitzi Davenportrenese Clayton  Procedure(s) Performed: Procedure(s) (LRB): CESAREAN SECTION (N/A)  Patient location during evaluation: Mother Baby Anesthesia Type: Epidural Level of consciousness: awake and alert Pain management: pain level controlled Vital Signs Assessment: post-procedure vital signs reviewed and stable Respiratory status: spontaneous breathing Cardiovascular status: stable Postop Assessment: no headache, no backache, no signs of nausea or vomiting and adequate PO intake Anesthetic complications: no    Last Vitals:  Filed Vitals:   11/08/15 0210 11/08/15 0636  BP: 146/85 152/88  Pulse: 80 88  Temp:  37.1 C  Resp:  20    Last Pain:  Filed Vitals:   11/08/15 0733  PainSc: 0-No pain                 Tyress Loden Hristova

## 2015-11-08 NOTE — Lactation Note (Signed)
This note was copied from the chart of Carla Carson. Lactation Consultation Note  Patient Name: Carla Mitzi Davenportrenese Bonifas ZOXWR'UToday's Date: 11/08/2015 Reason for consult: Initial assessment;Follow-up assessment   Follow up with mom of 23 hour old infant. Infant with 2 BF for 15-25, 4 formula feeds via bottle of 3-10 cc, 4 supplements of EBM of 5-10 cc, 1 void and 4 stools since birth. Infant on single phototherapy today. Mom denies nipple tenderness. Advised mom to increase supplement of EBM/Neocare to 10-20 cc every 3 hours post BF. Mom aware infant needs to be awakened if not awake to feed every 3 hours. Mom latched infant to left breast independently and infant with rhythmic sucking and intermittent swallows. Enc mom to BF for 15 minutes then offer supplement. Advised mom to call for questions/concerns.      Maternal Data Formula Feeding for Exclusion: No Has patient been taught Hand Expression?: Yes Does the patient have breastfeeding experience prior to this delivery?: Yes  Feeding Feeding Type: Breast Fed Nipple Type: Slow - flow Length of feed: 12 min  LATCH Score/Interventions Latch: Grasps breast easily, tongue down, lips flanged, rhythmical sucking. Intervention(s): Breast massage;Breast compression  Audible Swallowing: Spontaneous and intermittent  Type of Nipple: Everted at rest and after stimulation  Comfort (Breast/Nipple): Filling, red/small blisters or bruises, mild/mod discomfort  Problem noted: Filling Interventions (Filling): Hand pump  Hold (Positioning): No assistance needed to correctly position infant at breast. Intervention(s): Breastfeeding basics reviewed;Support Pillows;Position options;Skin to skin  LATCH Score: 9  Lactation Tools Discussed/Used WIC Program: No Pump Review: Milk Storage;Setup, frequency, and cleaning Initiated by:: Noralee StainSharon Gawain Crombie, RN, IBCLC Date initiated:: 11/08/15   Consult Status Consult Status: Complete Follow-up type: Call as  needed    Silas FloodSharon S Shella Lahman 11/08/2015, 9:30 AM

## 2015-11-08 NOTE — Progress Notes (Signed)
Post OP Day 1   Subjective:  Carla Carson is a 30 y.o. G3P3003 226w5d s/p LTCS 2/2 NRFHTs.  No acute events overnight.  Pt denies problems with ambulating, voiding or po intake.  She denies nausea or vomiting.  Pain is well controlled.  She has had flatus. She has not had bowel movement.  Lochia Small.  Plan for birth control is IUD.  Method of Feeding: breast.   Objective: BP 131/76 mmHg  Pulse 76  Temp(Src) 98.7 F (37.1 C) (Oral)  Resp 18  Ht 5\' 3"  (1.6 m)  Wt 120.203 kg (265 lb)  BMI 46.95 kg/m2  SpO2 97%  LMP 01/31/2015 (Approximate)  Breastfeeding? Unknown  Physical Exam:  General: alert, cooperative and no distress Lochia:normal flow Chest: CTAB Heart: RRR no m/r/g Abdomen: +BS, soft, nontender, fundus firm at/below umbilicus Uterine Fundus: firm DVT Evaluation: No evidence of DVT seen on physical exam. Extremities: no edema   Recent Labs  11/07/15 0228 11/08/15 0545  HGB 12.4 11.2*  HCT 37.0 34.3*    Assessment/Plan:  ASSESSMENT: Carla Carson is a 30 y.o. G3P3003 5826w5d pod #1 s/p LTCS 2/2 NRFHTs doing well. Noted elevated BP values x 2 early this morning. Patient's most recent BP 131/76. Will continue to monitor BP before starting patient on antihypertensive. Discussed with patient that if we do start a medication, it will most likely be Enalapril. Patient understood and will get IUD as contraception.    Continue Postpartum Care    LOS: 1 day   Palma HolterKanishka G Gunadasa 11/08/2015, 9:26 AM   Seen and examined by me also Agree with note Dressing clean and intact Lochia mod Baby on phototherapy Continue postop care Aviva SignsMarie L Walid Haig, CNM

## 2015-11-08 NOTE — Addendum Note (Signed)
Addendum  created 11/08/15 0845 by Elgie CongoNataliya H Rori Goar, CRNA   Modules edited: Clinical Notes   Clinical Notes:  File: [id number]

## 2015-11-08 NOTE — Progress Notes (Signed)
CSW informed by nursing staff that CPS arrived at the hospital to meet with MOB.  CSW had requested during previous conversation with CPS that CSW be contacted after visit in order to receive discharge recommendations.  Upon arrival to MOB's room, CPS worker had already left the hospital.   MOB reported that the visit went "okay", and stated that CPS cleared her for discharge as long as she located a place to stay at discharge. [facility] to begin intake process for emergency housing.  MOB continues to be uncertain of her discharge plan.   CSW attempted to follow up with CPS, but was unable to make contact with her prior to the end of the day on 1/14.   Weekend CSW to be in contact with after-hour CPS workers on 1/14 to determine status of the case.  CSW continues to recommend no discharge until CSW is able to receive discharge recommendations directly from CPS.

## 2015-11-08 NOTE — Lactation Note (Signed)
This note was copied from the chart of Carla Carson. Lactation Consultation Note New mom had a 4.7 lb baby. Noted jittery. RN had DEBP set up. Mom knows to pump q3h for 15-20 min. Cleaning instructions taught and demonstrated. Mom pumped 20ml colostrum. Has 22 cal. Formula in rm. Gave LPI information sheet w/feeding guidelines of supplementing. Explained to mom to subtract BM from formula amount. This feeding mom is only having to give BM. Lab into do bili serum and glucose serum. Baby has poor suck swallow coordination.  Suck training done. Has no fat pads in cheeks. Gagging occasionally. Took 30 min. To give 10ml. Mom has large pendulum breast w/great everted nipples. Mom encouraged to feed baby 8-12 times/24 hours and with feeding cues. Referred to Baby and Me Book in Breastfeeding section Pg. 22-23 for position options and Proper latch demonstration. Educated about LPI newborn behavior. Stressed importance of I&O and STS. WH/LC brochure given w/resources, support groups and LC services. Patient Name: Carla Carson WUJWJ'XToday's Date: 11/08/2015 Reason for consult: Initial assessment   Maternal Data Has patient been taught Hand Expression?: Yes Does the patient have breastfeeding experience prior to this delivery?: No  Feeding Feeding Type: Bottle Fed - Breast Milk Nipple Type: Slow - flow Length of feed: 15 min  LATCH Score/Interventions Latch: Repeated attempts needed to sustain latch, nipple held in mouth throughout feeding, stimulation needed to elicit sucking reflex.     Type of Nipple: Everted at rest and after stimulation  Comfort (Breast/Nipple): Soft / non-tender     Intervention(s): Skin to skin;Position options;Support Pillows;Breastfeeding basics reviewed     Lactation Tools Discussed/Used Tools: Pump Breast pump type: Double-Electric Breast Pump WIC Program: Yes Pump Review: Setup, frequency, and cleaning;Milk Storage Initiated by:: Susie RN Date initiated::  11/07/15   Consult Status Consult Status: Follow-up Date: 11/08/15 Follow-up type: In-patient    Carla Carson, Carla Carson 11/08/2015, 1:58 AM

## 2015-11-09 NOTE — Progress Notes (Signed)
Post OP Day    Subjective:  Carla Carson is a 30 y.o. G3P3003 4875w5d s/p LTCS 2/2 NRFHTs.  No acute events overnight.  Pt denies problems with ambulating, voiding or po intake.  She denies nausea or vomiting.  Pain is well controlled.  She has had flatus. She has had bowel movement.  Lochia Small.  Plan for birth control is IUD.  Method of Feeding: breast.   Objective: BP 131/75 mmHg  Pulse 84  Temp(Src) 98.2 F (36.8 C) (Oral)  Resp 18  Ht 5\' 3"  (1.6 m)  Wt 120.203 kg (265 lb)  BMI 46.95 kg/m2  SpO2 100%  LMP 01/31/2015 (Approximate)  Breastfeeding? Unknown  Physical Exam:  General: alert, cooperative and no distress Lochia:normal flow Chest: CTAB Heart: RRR no m/r/g Abdomen: +BS, soft, nontender, fundus firm at/below umbilicus Uterine Fundus: firm Incision: Dressing clean, dry, intact DVT Evaluation: No evidence of DVT seen on physical exam. Extremities: no edema   Recent Labs  11/07/15 0228 11/08/15 0545  HGB 12.4 11.2*  HCT 37.0 34.3*    Assessment/Plan:  ASSESSMENT: Carla Carson is a 30 y.o. G3P3003 1675w5d pod #2 s/p LTCS 2/2 NRFHTs doing well. BP within normal limits.   Awaiting CPS clearance prior to discharge.    Continue Postpartum Care   LOS: 2 days   Jacquiline Doealeb Parker 11/09/2015, 8:21 AM   CNM attestation Post Partum Day #2 I have seen and examined this patient and agree with above documentation in the resident's note.   Carla Carson is a 30 y.o. G3P3003 s/p pLTCS.  Pt denies problems with ambulating, voiding or po intake. Pain is well controlled.  Plan for birth control is IUD.  Method of Feeding: breast  PE:  BP 131/75 mmHg  Pulse 84  Temp(Src) 98.2 F (36.8 C) (Oral)  Resp 18  Ht 5\' 3"  (1.6 m)  Wt 120.203 kg (265 lb)  BMI 46.95 kg/m2  SpO2 100%  LMP 01/31/2015 (Approximate)  Breastfeeding? Unknown Fundus firm  Plan for discharge: 11/10/15  Cam HaiSHAW, Milan Clare, CNM 9:23 AM  11/09/2015

## 2015-11-09 NOTE — Lactation Note (Signed)
This note was copied from the chart of Carla Mitzi Davenportrenese Trosper. Lactation Consultation Note' Mom reports baby has been nursing well and her breasts are much fuller this morning. Pumped earlier this morning and obtained 30 ml from one breast and about 10 from the other. Concerned that both breasts were producing the same amount of milk. Reassurance given. Last fed baby about 1 hour ago. Reviewed importance of frequent nursing or pumping to prevent engorgement. No questions at present. To call for assist prn  Patient Name: Carla Carson ZOXWR'UToday's Date: 11/09/2015 Reason for consult: Follow-up assessment;Infant < 6lbs;Late preterm infant   Maternal Data Formula Feeding for Exclusion: No Does the patient have breastfeeding experience prior to this delivery?: Yes  Feeding    LATCH Score/Interventions          Comfort (Breast/Nipple): Filling, red/small blisters or bruises, mild/mod discomfort  Problem noted: Filling Interventions (Filling): Double electric pump;Frequent nursing        Lactation Tools Discussed/Used WIC Program: No   Consult Status Consult Status: PRN    Pamelia HoitWeeks, Cassidee Deats D 11/09/2015, 12:17 PM

## 2015-11-09 NOTE — Progress Notes (Signed)
Spoke with on-call caseworker regarding case status.  Spoke with Aggie MoatsKayce Owens and informed that she will research the case and re-call CSW.

## 2015-11-10 MED ORDER — AMLODIPINE BESYLATE 10 MG PO TABS: 10.0000 mg | ORAL_TABLET | Freq: Every day | ORAL | Status: DC

## 2015-11-10 MED ORDER — OXYCODONE-ACETAMINOPHEN 5-325 MG PO TABS: 1.0000 | ORAL_TABLET | ORAL | Status: DC | PRN

## 2015-11-10 MED ORDER — IBUPROFEN 600 MG PO TABS: 600.0000 mg | ORAL_TABLET | Freq: Four times a day (QID) | ORAL | Status: DC

## 2015-11-10 NOTE — Lactation Note (Signed)
This note was copied from the chart of Carla Carson. Lactation Consultation Note  Patient Name: Carla Mitzi Davenportrenese Senteno ZOXWR'UToday's Date: 11/10/2015  Scheduled mom a WIC appointment for 11/12/15 @ 10am for mom & baby. Mom has been told what to bring & [facility] [phone] if she cannot make that appointment or if she has questions. Plan is for mom to hopefully get a pedal pump so mom can continue to pump but [facility]   Maternal Data    Feeding    [facility]ventions                      Lactation Tools Discussed/Used     Consult Status      Oneal GroutLaura C Jaydeen Darley 11/10/2015, 1:06 PM

## 2015-11-10 NOTE — Progress Notes (Signed)
Discharge education complete, script given, discharge instructions and follow up appointment discussed. Patient verbalized understanding.

## 2015-11-10 NOTE — Discharge Instructions (Signed)
Cesarean Delivery, Care After  Refer to this sheet in the next few weeks. These instructions provide you with information on caring for yourself after your procedure. Your health care provider may also give you specific instructions. Your treatment has been planned according to current medical practices, but problems sometimes occur. Call your health care provider if you have any problems or questions after you go home.  HOME CARE INSTRUCTIONS   Only take over-the-counter or prescription medications as directed by your health care provider.   Do not drink alcohol, especially if you are breastfeeding or taking medication to relieve pain.   Do not chew or smoke tobacco.   Continue to use good perineal care. Good perineal care includes:    Wiping your perineum from front to back.    Keeping your perineum clean.   Check your surgical cut (incision) daily for increased redness, drainage, swelling, or separation of skin.   Clean your incision gently with soap and water every day, and then pat it dry. If your health care provider says it is okay, leave the incision uncovered. Use a bandage (dressing) if the incision is draining fluid or appears irritated. If the adhesive strips across the incision do not fall off within 7 days, carefully peel them off.   Hug a pillow when coughing or sneezing until your incision is healed. This helps to relieve pain.   Do not use tampons or douche until your health care provider says it is okay.   Shower, wash your hair, and take tub baths as directed by your health care provider.   Wear a well-fitting bra that provides breast support.   Limit wearing support panties or control-top hose.   Drink enough fluids to keep your urine clear or pale yellow.   Eat high-fiber foods such as whole grain cereals and breads, brown rice, beans, and fresh fruits and vegetables every day. These foods may help prevent or relieve constipation.   Resume activities such as climbing stairs,  driving, lifting, exercising, or traveling as directed by your health care provider.   Talk to your health care provider about resuming sexual activities. This is dependent upon your risk of infection, your rate of healing, and your comfort and desire to resume sexual activity.   Try to have someone help you with your household activities and your newborn for at least a few days after you leave the hospital.   Rest as much as possible. Try to rest or take a nap when your newborn is sleeping.   Increase your activities gradually.   Keep all of your scheduled postpartum appointments. It is very important to keep your scheduled follow-up appointments. At these appointments, your health care provider will be checking to make sure that you are healing physically and emotionally.  SEEK MEDICAL CARE IF:    You are passing large clots from your vagina. Save any clots to show your health care provider.   You have a foul smelling discharge from your vagina.   You have trouble urinating.   You are urinating frequently.   You have pain when you urinate.   You have a change in your bowel movements.   You have increasing redness, pain, or swelling near your incision.   You have pus draining from your incision.   Your incision is separating.   You have painful, hard, or reddened breasts.   You have a severe headache.   You have blurred vision or see spots.   You feel sad   or depressed.   You have thoughts of hurting yourself or your newborn.   You have questions about your care, the care of your newborn, or medications.   You are dizzy or light-headed.   You have a rash.   You have pain, redness, or swelling at the site of the removed intravenous access (IV) tube.   You have nausea or vomiting.   You stopped breastfeeding and have not had a menstrual period within 12 weeks of stopping.   You are not breastfeeding and have not had a menstrual period within 12 weeks of delivery.   You have a fever.  SEEK  IMMEDIATE MEDICAL CARE IF:   You have persistent pain.   You have chest pain.   You have shortness of breath.   You faint.   You have leg pain.   You have stomach pain.   Your vaginal bleeding saturates 2 or more sanitary pads in 1 hour.  MAKE SURE YOU:    Understand these instructions.   Will watch your condition.   Will get help right away if you are not doing well or get worse.     This information is not intended to replace advice given to you by your health care provider. Make sure you discuss any questions you have with your health care provider.     Document Released: 07/04/2002 Document Revised: 11/02/2014 Document Reviewed: 06/08/2012  Elsevier Interactive Patient Education 2016 [employer]

## 2015-11-10 NOTE — Discharge Summary (Signed)
Obstetric Discharge Summary Reason for Admission: induction of labor Prenatal Procedures: none Intrapartum Procedures: cesarean: low cervical, transverse Postpartum Procedures: none Complications-Operative and Postpartum: none  Hospital Course:  Patient was admitted for IOL secondary to preeclampsia. She had prolonged decels and required c-section for non-reassuring FHT.   Operative Note PATIENT: Carla Carson 30 y.o. female  PRE-OPERATIVE DIAGNOSIS: non reassuring fhr  POST-OPERATIVE DIAGNOSIS: non reassuring fhr  PROCEDURE: Procedure(s): CESAREAN SECTION (N/A)  SURGEON: Surgeon(s) and Role:  * Willodean Rosenthal, MD - Primary  ANESTHESIA: spinal  EBL: Total I/O In: 1500 [I.V.:1500] Out: 1450 [Urine:750; Blood:700]  BLOOD ADMINISTERED:none  DRAINS: none   LOCAL MEDICATIONS USED: MARCAINE   SPECIMEN: Source of Specimen: placenta to pathology  DISPOSITION OF SPECIMEN: PATHOLOGY  COUNTS: YES  TOURNIQUET: * No tourniquets in log *  DICTATION: .Note written in EPIC  PLAN OF CARE: Admit to inpatient   PATIENT DISPOSITION: PACU - hemodynamically stable.  Delay start of Pharmacological VTE agent (>24hrs) due to surgical blood loss or risk of bleeding: yes  Complications: None immediate  INDICATIONS: Carla Carson is a 30 y.o. G3P2002 at [redacted]w[redacted]d here for cesarean section secondary to the indications listed under preoperative diagnosis; please see preoperative note for further details. The risks of cesarean section were discussed with the patient including but were not limited to: bleeding which may require transfusion or reoperation; infection which may require antibiotics; injury to bowel, bladder, ureters or other surrounding organs; injury to the fetus; need for additional procedures including hysterectomy in the event of a life-threatening hemorrhage; placental abnormalities wth subsequent pregnancies, incisional problems, thromboembolic  phenomenon and other postoperative/anesthesia complications. The patient concurred with the proposed plan, giving informed written consent for the procedure.   FINDINGS: Viable female infant in cephalic presentation. Apgars pending. Clear amniotic fluid. Intact placenta- small, three vessel cord with true know in cord. Normal uterus, fallopian tubes and ovaries bilaterally.  PROCEDURE IN DETAIL: The patient preoperatively received intravenous antibiotics and had sequential compression devices applied to her lower extremities. She was then taken to the operating room where spinal anesthesia was administered and was found to be adequate. She was then placed in a dorsal supine position with a leftward tilt, and prepped and draped in a sterile manner. A foley catheter was placed into her bladder and attached to constant gravity. After an adequate timeout was performed, a Pfannenstiel skin incision was made with scalpel and carried through to the underlying layer of fascia. The fascia was incised in the midline, and this incision was extended bilaterally using the Mayo scissors. Kocher clamps were applied to the superior aspect of the fascial incision and the underlying rectus muscles were dissected off bluntly. A similar process was carried out on the inferior aspect of the fascial incision. The rectus muscles were separated in the midline bluntly and the peritoneum was entered bluntly. Attention was turned to the lower uterine segment where a low transverse hysterotomy incision was made with a scalpel and extended bilaterally bluntly. The infant was successfully delivered, the cord was clamped and cut and the infant was handed over to awaiting neonatology team. The placenta was delivered manually. Uterine massage was then administered. The placenta was intact with a three-vessel cord that contained a true knot. The uterus was then cleared of clot and debris. The hysterotomy was closed with 0 Vicryl  in a running locked fashion, and an imbricating layer was also placed with the same suture. The uterus was returned to the pelvis. The pelvis was cleared of  all clot and debris. Hemostasis was confirmed on all surfaces. The peritoneum and the muscles were reapproximated using 0 Vicryl with 1 interrupted suture. The fascia was then closed using 0 Vicryl. The subcutaneous layer was irrigated, then reapproximated with 3-0 vicryl in a running locked fashion, and the skin was closed with a 4-0 Vicryl subcuticular stitch. 30 cc of 0.5% marcaine was injected into the incision and benzoin and steristrips were applied. The patient tolerated the procedure well. Sponge, lap, instrument and needle counts were correct x 2. She was taken to the recovery room in stable condition.   [name], M.D., FACOG   Postpartum, the patient was noted to have elevated BP and was started on amlodipine prior to discharge.  Social work was also involved due to concerns about her housing situation. A CPS case was opened and pending at the time of the patient's discharge. Patient's baby continued to require hospitalization at the time of the patient's discharge.   On the day of discharge,  Pt denied problems with ambulating, voiding or po intake.  She denied nausea or vomiting.  Pain wass well controlled.  She has had flatus. She has had bowel movement.  Lochia Minimal.  Plan for birth control is undecided.    H/H: Lab Results  Component Value Date/Time   HGB 11.2* 11/08/2015 05:45 AM   HCT 34.3* 11/08/2015 05:45 AM    Filed Vitals:   11/09/15 1822 11/10/15 0537  BP: 135/79 157/99  Pulse: 80 80  Temp: 97.8 F (36.6 C) 98 F (36.7 C)  Resp: 20 18    Physical Exam: NAD Abd: Appropriately tender, ND, Fundus firm Incision: Dressing clean, dry , intact No c/c/e, Neg homan's sign, neg cords Lochia Appropriate  Discharge Diagnoses: Term Pregnancy-delivered  Discharge Information: Date:  11/10/2015 Activity: pelvic rest Diet: routine  Medications: Ibuprofen, Percocet and Norvasc Breast feeding:  Yes Condition: stable Instructions: refer to handout Discharge to: home      Medication List    TAKE these medications        acetaminophen 500 MG tablet  Commonly known as:  TYLENOL  Take 1,000 mg by mouth every 6 (six) hours as needed.     amLODipine 10 MG tablet  Commonly known as:  NORVASC  Take 1 tablet (10 mg total) by mouth daily.     ibuprofen 600 MG tablet  Commonly known as:  ADVIL,MOTRIN  Take 1 tablet (600 mg total) by mouth every 6 (six) hours.     oxyCODONE-acetaminophen 5-325 MG tablet  Commonly known as:  PERCOCET/ROXICET  Take 1 tablet by mouth every 4 (four) hours as needed for moderate pain or severe pain.       Follow-up Information    [facility]   Specialty:  Obstetrics and Gynecology   Why:  for post partum check   Contact information:   [address] [place] [id number] [phone]      Jacquiline DoeCaleb Parker 11/10/2015,8:37 AM  Seen also by me Agree with note Will give Rxes for FOB to fill Baby staying at least up until Monday Aviva SignsMarie L Brisia Schuermann, CNM

## 2015-11-10 NOTE — Lactation Note (Signed)
This note was copied from the chart of Carla Mitzi Davenportrenese Burack. Lactation Consultation Note  Baby is receiving all breast milk at this point.  Encouraged mom to switch to the maintain phase on the symphony.  She does not currently have WIC but plans to apply.  Mom was shown how to use the piston as a double manual pump.  Follow-up tomorrow.  Patient Name: Carla Carson Reason for consult: Follow-up assessment   Maternal Data    Feeding Feeding Type: Bottle Fed - Breast Milk  LATCH Score/Interventions                      Lactation Tools Discussed/Used     Consult Status      Carla Carson, Carla Carson Carson, 9:19 AM

## 2016-01-15 ENCOUNTER — Encounter: Payer: Self-pay | Admitting: Obstetrics & Gynecology

## 2016-01-15 ENCOUNTER — Ambulatory Visit: Payer: Medicaid Other | Admitting: Obstetrics & Gynecology
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
## 2016-07-16 ENCOUNTER — Ambulatory Visit (INDEPENDENT_AMBULATORY_CARE_PROVIDER_SITE_OTHER): Payer: Medicaid Other | Admitting: Obstetrics and Gynecology

## 2016-07-16 ENCOUNTER — Encounter: Payer: Self-pay | Admitting: Obstetrics and Gynecology

## 2016-07-16 VITALS — BP 117/71 | HR 72 | Wt 255.1 lb

## 2016-07-16 DIAGNOSIS — O99340 Other mental disorders complicating pregnancy, unspecified trimester: Secondary | ICD-10-CM

## 2016-07-16 DIAGNOSIS — O0991 Supervision of high risk pregnancy, unspecified, first trimester: Secondary | ICD-10-CM

## 2016-07-16 DIAGNOSIS — O1493 Unspecified pre-eclampsia, third trimester: Secondary | ICD-10-CM

## 2016-07-16 DIAGNOSIS — Z8619 Personal history of other infectious and parasitic diseases: Secondary | ICD-10-CM

## 2016-07-16 DIAGNOSIS — Z98891 History of uterine scar from previous surgery: Secondary | ICD-10-CM

## 2016-07-16 DIAGNOSIS — Z8759 Personal history of other complications of pregnancy, childbirth and the puerperium: Secondary | ICD-10-CM | POA: Insufficient documentation

## 2016-07-16 DIAGNOSIS — F319 Bipolar disorder, unspecified: Secondary | ICD-10-CM

## 2016-07-16 DIAGNOSIS — O0990 Supervision of high risk pregnancy, unspecified, unspecified trimester: Secondary | ICD-10-CM | POA: Insufficient documentation

## 2016-07-16 DIAGNOSIS — Z6841 Body Mass Index (BMI) 40.0 and over, adult: Secondary | ICD-10-CM

## 2016-07-16 DIAGNOSIS — Z8742 Personal history of other diseases of the female genital tract: Secondary | ICD-10-CM

## 2016-07-16 DIAGNOSIS — O99820 Streptococcus B carrier state complicating pregnancy: Secondary | ICD-10-CM | POA: Insufficient documentation

## 2016-07-16 DIAGNOSIS — O09899 Supervision of other high risk pregnancies, unspecified trimester: Secondary | ICD-10-CM

## 2016-07-16 DIAGNOSIS — O99342 Other mental disorders complicating pregnancy, second trimester: Secondary | ICD-10-CM

## 2016-07-16 DIAGNOSIS — O99210 Obesity complicating pregnancy, unspecified trimester: Secondary | ICD-10-CM | POA: Insufficient documentation

## 2016-07-16 LAB — COMPREHENSIVE METABOLIC PANEL
ALBUMIN: 3.4 g/dL — AB (ref 3.6–5.1)
ALK PHOS: 62 U/L (ref 33–115)
ALT: 9 U/L (ref 6–29)
AST: 13 U/L (ref 10–30)
BILIRUBIN TOTAL: 0.2 mg/dL (ref 0.2–1.2)
BUN: 5 mg/dL — ABNORMAL LOW (ref 7–25)
CO2: 24 mmol/L (ref 20–31)
CREATININE: 0.64 mg/dL (ref 0.50–1.10)
Calcium: 8.6 mg/dL (ref 8.6–10.2)
Chloride: 105 mmol/L (ref 98–110)
Glucose, Bld: 79 mg/dL (ref 65–99)
Potassium: 4.3 mmol/L (ref 3.5–5.3)
SODIUM: 140 mmol/L (ref 135–146)
TOTAL PROTEIN: 6.4 g/dL (ref 6.1–8.1)

## 2016-07-16 LAB — POCT URINALYSIS DIP (DEVICE)
Bilirubin Urine: NEGATIVE
GLUCOSE, UA: NEGATIVE mg/dL
Hgb urine dipstick: NEGATIVE
Ketones, ur: NEGATIVE mg/dL
Leukocytes, UA: NEGATIVE
Nitrite: NEGATIVE
PROTEIN: 30 mg/dL — AB
UROBILINOGEN UA: 1 mg/dL (ref 0.0–1.0)
pH: 6 (ref 5.0–8.0)

## 2016-07-16 LAB — TSH: TSH: 0.59 m[IU]/L

## 2016-07-16 NOTE — Progress Notes (Signed)
New OB Note  07/16/2016   Clinic: Center for [facility]  Chief Complaint: NOB  Transfer of Care Patient: Yes, from [facility] for h/o LBW child in last pregnancy  History of Present Illness: Ms. Dehaas is a 30 y.o. [id number] @ 23/6 weeks ([place] 1/12, based on Patient's last menstrual period was 01/31/2016.), with the above CC. Preg complicated by has Preeclampsia; Pregnancy, supervision, high-risk; History of syphilis; History of cesarean delivery; Bipolar disease during pregnancy ([place]); BMI 45.0-49.9, adult ([place]); Obesity in pregnancy; and Short interval between pregnancies affecting pregnancy, antepartum on her problem list.   She was using no method when she conceived.  She has Negative signs or symptoms of nausea/vomiting of pregnancy. She has Negative signs or symptoms of miscarriage or preterm labor She identifies Negative Zika risk factors for her and her partner On any different medications around the time she conceived/early pregnancy: Yes, Lamictal but stopped early in pregnancy when she found out   ROS: A 12-point review of systems was performed and negative, except as stated in the above HPI.  OBGYN History: As per HPI. OB History  Gravida Para Term Preterm AB Living  '4 3 3 '$ 0 0 3  SAB TAB Ectopic Multiple Live Births  0 0 0 0 3    # Outcome Date GA Lbr Len/2nd Weight Sex Delivery Anes PTL Lv  4 Current           3 Term 11/07/15 [redacted]w[redacted]d 4 lb 7.4 oz (2.025 kg) M CS-LTranv Spinal  LIV  2 Term 01/29/12 337w5d2:31 / 00:15 7 lb 13 oz (3.544 kg) M Vag-Spont EPI  LIV  1 Term 03/10/10 3948w0d lb 5 oz (2.863 kg) M Vag-Spont EPI  LIV      Any issues with any prior pregnancies: yes, pre-eclampsia Any prior children are healthy, doing well, without any problems or issues: yes History of pap smears: No. Last pap smear 2017. Abnormal: no History of STIs: Yes   Past Medical History: Past Medical History:  Diagnosis Date  . Chlamydia   . Gonorrhea   . Infection    UTI  .  Ovarian cyst   . Syphilis     Past Surgical History: Past Surgical History:  Procedure Laterality Date  . CESAREAN SECTION N/A 11/07/2015   Procedure: CESAREAN SECTION;  Surgeon: CarLavonia DraftsD;  [facility]: [facility];  Service: Obstetrics;  Laterality: N/A;  . WISDOM TOOTH EXTRACTION      Family History:  Family History  Problem Relation Age of Onset  . Heart disease Mother     hole in heart  . Cancer Maternal Grandmother    She denies any female cancers, bleeding or blood clotting disorders.  She denies any history of mental retardation, birth defects or genetic disorders in her or the FOB's history  Social History:  Social History   Social History  . Marital status: Single    Spouse name: N/A  . Number of children: N/A  . Years of education: N/A   Occupational History  . Not on file.   Social History Main Topics  . Smoking status: Current Some Day Smoker    Packs/day: 0.25    Years: 6.00    Types: Cigarettes  . Smokeless tobacco: Never Used  . Alcohol use No  . Drug use:     Types: Marijuana     Comment: once in a blue moon  . Sexual activity: Yes    Birth control/ protection: None   Other  Topics Concern  . Not on file   Social History Narrative  . No narrative on file   Allergy: No Known Allergies   Current Outpatient Medications: PNV  Physical Exam:   BP 117/71   Pulse 72   Wt 255 lb 1.6 oz (115.7 kg)   LMP 01/31/2016   BMI 45.19 kg/m  Body mass index is 45.19 kg/m. Fundal height: 21 FHTs: 140s  General appearance: Well nourished, well developed female in no acute distress.   Laboratory: [facility] 8/31 O pos/syphilis screen negative/hiv neg/hepB neg/RI/VI/pap neg/gc-ct neg/ucx neg/early 1hr neg/quad negative  Declined CF and UDS  Imaging:  none  Assessment: pt doing well  Plan: 1. Pregnancy, supervision, high-risk, first trimester Pt denies ever having an anatomy or dating scan so this was ordered.  - [place] MFM OB COMP + 14  WK; Future - Comp Met (CMET) - TSH - Protein / Creatinine Ratio, Urine  2. History of pre-eclampsia Will get baseline tsh and labs today. Pt told to start baby asa today - Comp Met (CMET) - TSH - Protein / Creatinine Ratio, Urine  3. History of syphilis Negative [facility] screen. Recheck at 28wks and at admission  5. History of cesarean delivery D/w pt later in pregnancy re: mode of delivery  6. Bipolar disease during pregnancy, second trimester ([place]) No current s/s. Pt encouraged to still see [facility] for routine follow up  7. BMI 45.0-49.9, adult (HCC) No change in protocol  8. Obesity in pregnancy No change in protocol  9. Short interval between pregnancies affecting pregnancy, antepartum No change in protocl  10. H/o LBW and c/s for NRFHT Would recommend serial growths starting at 28wks  Problem list reviewed and updated.  Follow up in 3 weeks.  >50% of 25 min visit spent on counseling and coordination of care.     Durene Romans MD Attending Center for [facility])

## 2016-07-16 NOTE — Progress Notes (Signed)
Not taking blood pressure medications

## 2016-07-17 LAB — PROTEIN / CREATININE RATIO, URINE
Creatinine, Urine: 337 mg/dL — ABNORMAL HIGH (ref 20–320)
PROTEIN CREATININE RATIO: 89 mg/g{creat} (ref 21–161)
Total Protein, Urine: 30 mg/dL — ABNORMAL HIGH (ref 5–24)

## 2016-07-20 ENCOUNTER — Encounter: Payer: Self-pay | Admitting: Obstetrics and Gynecology

## 2016-07-27 ENCOUNTER — Other Ambulatory Visit: Payer: Self-pay | Admitting: Obstetrics and Gynecology

## 2016-07-27 ENCOUNTER — Ambulatory Visit (HOSPITAL_COMMUNITY)
Admission: RE | Admit: 2016-07-27 | Discharge: 2016-07-27 | Disposition: A | Discharge status: Home / Self Care | Payer: Medicaid Other | Source: Ambulatory Visit | Attending: Obstetrics and Gynecology | Admitting: Obstetrics and Gynecology

## 2016-07-27 DIAGNOSIS — Z363 Encounter for antenatal screening for malformations: Secondary | ICD-10-CM

## 2016-07-27 DIAGNOSIS — Z3A21 21 weeks gestation of pregnancy: Secondary | ICD-10-CM | POA: Insufficient documentation

## 2016-07-27 DIAGNOSIS — O34219 Maternal care for unspecified type scar from previous cesarean delivery: Secondary | ICD-10-CM | POA: Insufficient documentation

## 2016-07-27 DIAGNOSIS — O0991 Supervision of high risk pregnancy, unspecified, first trimester: Secondary | ICD-10-CM

## 2016-07-27 DIAGNOSIS — O09892 Supervision of other high risk pregnancies, second trimester: Secondary | ICD-10-CM | POA: Insufficient documentation

## 2016-07-27 DIAGNOSIS — Z3492 Encounter for supervision of normal pregnancy, unspecified, second trimester: Secondary | ICD-10-CM

## 2016-07-27 DIAGNOSIS — O99212 Obesity complicating pregnancy, second trimester: Secondary | ICD-10-CM

## 2016-08-06 ENCOUNTER — Encounter: Payer: Medicaid Other | Admitting: Family Medicine

## 2016-08-27 ENCOUNTER — Encounter: Payer: Medicaid Other | Admitting: Family Medicine

## 2016-09-03 ENCOUNTER — Ambulatory Visit (INDEPENDENT_AMBULATORY_CARE_PROVIDER_SITE_OTHER): Payer: Medicaid Other | Admitting: Family

## 2016-09-03 VITALS — BP 123/68 | HR 88 | Wt 258.0 lb

## 2016-09-03 DIAGNOSIS — Z8619 Personal history of other infectious and parasitic diseases: Secondary | ICD-10-CM

## 2016-09-03 DIAGNOSIS — Z0489 Encounter for examination and observation for other specified reasons: Secondary | ICD-10-CM

## 2016-09-03 DIAGNOSIS — Z23 Encounter for immunization: Secondary | ICD-10-CM

## 2016-09-03 DIAGNOSIS — O0993 Supervision of high risk pregnancy, unspecified, third trimester: Secondary | ICD-10-CM

## 2016-09-03 DIAGNOSIS — Z362 Encounter for other antenatal screening follow-up: Secondary | ICD-10-CM

## 2016-09-03 DIAGNOSIS — IMO0002 Reserved for concepts with insufficient information to code with codable children: Secondary | ICD-10-CM

## 2016-09-03 DIAGNOSIS — Z98891 History of uterine scar from previous surgery: Secondary | ICD-10-CM

## 2016-09-03 NOTE — Progress Notes (Signed)
   PRENATAL VISIT NOTE  Subjective:  Carla Carson is a 30 y.o. G4P3003 at 3682w1d being seen today for ongoing prenatal care.  She is currently monitored for the following issues for this high-risk pregnancy and has History of pre-eclampsia; Pregnancy, supervision, high-risk; History of syphilis; History of cesarean delivery; Bipolar disease during pregnancy (HCC); BMI 45.0-49.9, adult (HCC); Obesity in pregnancy; Short interval between pregnancies affecting pregnancy, antepartum; and History of prior pregnancy with SGA newborn on her problem list.  Patient reports no complaints.  Desires TOLAC and BTL   .  Marland Kitchen.  Movement: Present. Denies leaking of fluid.   The following portions of the patient's history were reviewed and updated as appropriate: allergies, current medications, past family history, past medical history, past social history, past surgical history and problem list. Problem list updated.  Objective:   Vitals:   09/03/16 0857  BP: 123/68  Pulse: 88  Weight: 258 lb (117 kg)    Fetal Status: Fetal Heart Rate (bpm): 144 Fundal Height: 29 cm Movement: Present     General:  Alert, oriented and cooperative. Patient is in no acute distress.  Skin: Skin is warm and dry. No rash noted.   Cardiovascular: Normal heart rate noted  Respiratory: Normal respiratory effort, no problems with respiration noted  Abdomen: Soft, gravid, appropriate for gestational age. Pain/Pressure: Absent     Pelvic:  Cervical exam deferred        Extremities: Normal range of motion.  Edema: None  Mental Status: Normal mood and affect. Normal behavior. Normal judgment and thought content.   Assessment and Plan:  Pregnancy: G4P3003 at 7382w1d  1. Evaluate anatomy not seen on prior sonogram - US MFM OB FOLLOW UP; Future  2. Supervision of high risk pregnancy in third trimester - Discussed 2 hr for next visit and need to come in fasting - BTL consent signed today - Tdap given  3. History of syphilis - RPR  at next visit  4. History of cesarean delivery -TOLAC consent signed today; explained increased risk due to close pregnancy spacing (will be 12 months post csection at time of delivery)  Preterm labor symptoms and general obstetric precautions including but not limited to vaginal bleeding, contractions, leaking of fluid and fetal movement were reviewed in detail with the patient. Please refer to After Visit Summary for other counseling recommendations.  Return in about 2 weeks (around 09/17/2016) for 2 hr and appt.   Eino FarberWalidah Kennith GainN Karim, CNM

## 2016-09-03 NOTE — Addendum Note (Signed)
Addended by: Cheree DittoGRAHAM, Mazzie Brodrick A on: 09/03/2016 10:13 AM   Modules accepted: Orders

## 2016-09-03 NOTE — Patient Instructions (Signed)
    Dental Assistance:  If unable to pay or uninsured, contact: [facility] to become qualified for the adult dental clinic. Patient must be enrolled in [employer] (uninsured, 0-200% FPL, qualifying info).  Enroll in [employer] first, then see Primary Care Physician assigned to you, the PCP makes a dental referral. [facility] will receive referral and contacts patient for appointment.  Patients with Medicaid           [address], [phone]  [facility] (Children up to 20 + Pregnant Women) - [phone]  [facility] - [address] - [address] [phone]  If unable to pay, or uninsured: contact [facility] ([phone] in [place] - (Children only + Pregnant Women), [phone] in [place]- Children only) to become qualified for the adult dental clinic  Must see if eligible to enroll in [employer] Marketplace before enrolling into the [employer] (exemption required) ([phone] for an appointment)  www.healthcare.gov;   1-800-318-2596.  If not eligible for ACA, then go by Department of Health and Human Services to see if eligible for "orange card."  [address], [place] and [address]- [place].  Once you get an orange card, you will have a Primary Care home who will then refer you to dental if needed.        Other Low-Cost Community Dental Services:   [facility] - [phone] ([phone])   [address] [place] [address]  Dr. Civils - [phone]   [address]    [facility] - [phone]   [address]           [facility]           [address], [place], [place], [address]           [phone], Ext. [phone]           2nd and 4th Thursday of the month at 6:30am (Simple extractions only - no wisdom teeth or surgery) First come/First serve -First 10 clients served           [facility] ([place] and [place] residents only)          [address], [place], [place], [address]           [phone]                    [facility]           [phone]          [facility]          [phone]         [facility]          [phone]       

## 2016-09-10 ENCOUNTER — Other Ambulatory Visit: Payer: Self-pay | Admitting: Family

## 2016-09-10 ENCOUNTER — Ambulatory Visit (HOSPITAL_COMMUNITY)
Admission: RE | Admit: 2016-09-10 | Discharge: 2016-09-10 | Disposition: A | Discharge status: Home / Self Care | Payer: Medicaid Other | Source: Ambulatory Visit | Attending: Family | Admitting: Family

## 2016-09-10 DIAGNOSIS — Z3A28 28 weeks gestation of pregnancy: Secondary | ICD-10-CM | POA: Diagnosis not present

## 2016-09-10 DIAGNOSIS — O09893 Supervision of other high risk pregnancies, third trimester: Secondary | ICD-10-CM | POA: Diagnosis not present

## 2016-09-10 DIAGNOSIS — IMO0002 Reserved for concepts with insufficient information to code with codable children: Secondary | ICD-10-CM

## 2016-09-10 DIAGNOSIS — O34219 Maternal care for unspecified type scar from previous cesarean delivery: Secondary | ICD-10-CM | POA: Diagnosis not present

## 2016-09-10 DIAGNOSIS — O99213 Obesity complicating pregnancy, third trimester: Secondary | ICD-10-CM | POA: Diagnosis not present

## 2016-09-10 DIAGNOSIS — Z362 Encounter for other antenatal screening follow-up: Secondary | ICD-10-CM | POA: Insufficient documentation

## 2016-09-10 DIAGNOSIS — Z0489 Encounter for examination and observation for other specified reasons: Secondary | ICD-10-CM

## 2016-09-22 ENCOUNTER — Encounter: Payer: Self-pay | Admitting: Family

## 2016-09-30 ENCOUNTER — Telehealth: Payer: Self-pay | Admitting: Obstetrics and Gynecology

## 2016-09-30 ENCOUNTER — Encounter: Payer: Self-pay | Admitting: Obstetrics and Gynecology

## 2016-09-30 NOTE — Progress Notes (Signed)
Patient did not keep OB appointment for 09/30/2016.  Carla Carson, Jr MD Attending Center for [facility](Faculty Practice)

## 2016-10-12 ENCOUNTER — Observation Stay (HOSPITAL_COMMUNITY)
Admission: AD | Admit: 2016-10-12 | Discharge: 2016-10-13 | Disposition: A | Discharge status: Home / Self Care | Payer: Medicaid Other | Source: Ambulatory Visit | Attending: Family Medicine | Admitting: Family Medicine

## 2016-10-12 ENCOUNTER — Ambulatory Visit (INDEPENDENT_AMBULATORY_CARE_PROVIDER_SITE_OTHER): Payer: Self-pay | Admitting: Obstetrics & Gynecology

## 2016-10-12 ENCOUNTER — Encounter (HOSPITAL_COMMUNITY): Payer: Self-pay

## 2016-10-12 ENCOUNTER — Ambulatory Visit (INDEPENDENT_AMBULATORY_CARE_PROVIDER_SITE_OTHER): Payer: Self-pay | Admitting: Clinical

## 2016-10-12 ENCOUNTER — Encounter (HOSPITAL_COMMUNITY): Payer: Self-pay | Admitting: *Deleted

## 2016-10-12 ENCOUNTER — Inpatient Hospital Stay (EMERGENCY_DEPARTMENT_HOSPITAL)
Admission: AD | Admit: 2016-10-12 | Discharge: 2016-10-12 | Disposition: A | Discharge status: Home / Self Care | Payer: Medicaid Other | Source: Ambulatory Visit | Attending: Obstetrics and Gynecology | Admitting: Obstetrics and Gynecology

## 2016-10-12 VITALS — BP 112/65 | HR 93 | Wt 252.7 lb

## 2016-10-12 DIAGNOSIS — F17210 Nicotine dependence, cigarettes, uncomplicated: Secondary | ICD-10-CM | POA: Insufficient documentation

## 2016-10-12 DIAGNOSIS — Z3A32 32 weeks gestation of pregnancy: Secondary | ICD-10-CM

## 2016-10-12 DIAGNOSIS — O4703 False labor before 37 completed weeks of gestation, third trimester: Secondary | ICD-10-CM

## 2016-10-12 DIAGNOSIS — F3160 Bipolar disorder, current episode mixed, unspecified: Secondary | ICD-10-CM

## 2016-10-12 DIAGNOSIS — O0993 Supervision of high risk pregnancy, unspecified, third trimester: Secondary | ICD-10-CM

## 2016-10-12 DIAGNOSIS — O99333 Smoking (tobacco) complicating pregnancy, third trimester: Secondary | ICD-10-CM | POA: Insufficient documentation

## 2016-10-12 DIAGNOSIS — Z8759 Personal history of other complications of pregnancy, childbirth and the puerperium: Secondary | ICD-10-CM

## 2016-10-12 DIAGNOSIS — Z98891 History of uterine scar from previous surgery: Secondary | ICD-10-CM

## 2016-10-12 DIAGNOSIS — O479 False labor, unspecified: Secondary | ICD-10-CM

## 2016-10-12 DIAGNOSIS — O99343 Other mental disorders complicating pregnancy, third trimester: Secondary | ICD-10-CM

## 2016-10-12 DIAGNOSIS — F319 Bipolar disorder, unspecified: Secondary | ICD-10-CM

## 2016-10-12 DIAGNOSIS — F99 Mental disorder, not otherwise specified: Secondary | ICD-10-CM | POA: Insufficient documentation

## 2016-10-12 LAB — POCT URINALYSIS DIP (DEVICE)
Bilirubin Urine: NEGATIVE
Glucose, UA: NEGATIVE mg/dL
Hgb urine dipstick: NEGATIVE
KETONES UR: NEGATIVE mg/dL
Leukocytes, UA: NEGATIVE
Nitrite: NEGATIVE
PH: 6 (ref 5.0–8.0)
PROTEIN: 30 mg/dL — AB
SPECIFIC GRAVITY, URINE: 1.02 (ref 1.005–1.030)
Urobilinogen, UA: 1 mg/dL (ref 0.0–1.0)

## 2016-10-12 LAB — ABO/RH: ABO/RH(D): O POS

## 2016-10-12 LAB — CBC
HCT: 33.1 % — ABNORMAL LOW (ref 36.0–46.0)
Hemoglobin: 11.1 g/dL — ABNORMAL LOW (ref 12.0–15.0)
MCH: 29.3 pg (ref 26.0–34.0)
MCHC: 33.5 g/dL (ref 30.0–36.0)
MCV: 87.3 fL (ref 78.0–100.0)
PLATELETS: 370 10*3/uL (ref 150–400)
RBC: 3.79 MIL/uL — AB (ref 3.87–5.11)
RDW: 14.8 % (ref 11.5–15.5)
WBC: 16.6 10*3/uL — AB (ref 4.0–10.5)

## 2016-10-12 LAB — URINALYSIS, ROUTINE W REFLEX MICROSCOPIC
BILIRUBIN URINE: NEGATIVE
GLUCOSE, UA: NEGATIVE mg/dL
HGB URINE DIPSTICK: NEGATIVE
Ketones, ur: NEGATIVE mg/dL
Leukocytes, UA: NEGATIVE
Nitrite: NEGATIVE
Protein, ur: NEGATIVE mg/dL
SPECIFIC GRAVITY, URINE: 1.004 — AB (ref 1.005–1.030)
pH: 6 (ref 5.0–8.0)

## 2016-10-12 LAB — TYPE AND SCREEN
ABO/RH(D): O POS
Antibody Screen: NEGATIVE

## 2016-10-12 LAB — GLUCOSE, CAPILLARY: Glucose-Capillary: 85 mg/dL (ref 65–99)

## 2016-10-12 MED ORDER — ZOLPIDEM TARTRATE 5 MG PO TABS: 5.0000 mg | ORAL_TABLET | Freq: Every evening | ORAL | Status: DC | PRN

## 2016-10-12 MED ORDER — ACETAMINOPHEN 325 MG PO TABS
650.0000 mg | ORAL_TABLET | ORAL | Status: DC | PRN
  Administered 2016-10-12 – 2016-10-13 (×3): 650 mg via ORAL
  Filled 2016-10-12 (×3): qty 2

## 2016-10-12 MED ORDER — DOCUSATE SODIUM 100 MG PO CAPS
100.0000 mg | ORAL_CAPSULE | Freq: Every day | ORAL | Status: DC
  Administered 2016-10-13: 100 mg via ORAL
  Filled 2016-10-12: qty 1

## 2016-10-12 MED ORDER — PRENATAL MULTIVITAMIN CH
1.0000 | ORAL_TABLET | Freq: Every day | ORAL | Status: DC
  Administered 2016-10-12 – 2016-10-13 (×2): 1 via ORAL
  Filled 2016-10-12 (×2): qty 1

## 2016-10-12 MED ORDER — LACTATED RINGERS IV SOLN: INTRAVENOUS | Status: AC

## 2016-10-12 MED ORDER — BETAMETHASONE SOD PHOS & ACET 6 (3-3) MG/ML IJ SUSP
12.0000 mg | INTRAMUSCULAR | Status: AC
  Administered 2016-10-12 – 2016-10-13 (×2): 12 mg via INTRAMUSCULAR
  Filled 2016-10-12 (×2): qty 2

## 2016-10-12 MED ORDER — CALCIUM CARBONATE ANTACID 500 MG PO CHEW: 2.0000 | CHEWABLE_TABLET | ORAL | Status: DC | PRN

## 2016-10-12 NOTE — H&P (Signed)
ANTEPARTUM ADMISSION HISTORY AND PHYSICAL NOTE  History of Present Illness: Carla Carson is a 30 y.o. G4P3003 at 2821w5d admitted for preterm contractions. Patient reports the fetal movement as active. Patient reports uterine contraction  activity as irregular, patient states unknown frequency. Patient reports  vaginal bleeding as none. Patient describes fluid per vagina as None. Fetal presentation is cephalic.  She was seen yesterday and told she was closed. Today in office she was 1-1.5cm. She was then direct admitted to ANTEnatal for observations.   Patient Active Problem List   Diagnosis Date Noted  . Mental disorder 10/12/2016  . Preterm labor in third trimester 10/12/2016  . Pregnancy, supervision, high-risk 07/16/2016  . History of syphilis 07/16/2016  . History of cesarean delivery 07/16/2016  . Bipolar disease during pregnancy (HCC) 07/16/2016  . BMI 45.0-49.9, adult (HCC) 07/16/2016  . Obesity in pregnancy 07/16/2016  . Short interval between pregnancies affecting pregnancy, antepartum 07/16/2016  . History of prior pregnancy with SGA newborn 07/16/2016  . History of pre-eclampsia 11/07/2015   Past Medical History:  Diagnosis Date  . Chlamydia   . Gonorrhea   . Infection    UTI  . Ovarian cyst   . Syphilis    Past Surgical History:  Procedure Laterality Date  . CESAREAN SECTION N/A 11/07/2015   Procedure: CESAREAN SECTION;  Surgeon: Willodean Rosenthalarolyn Harraway-Smith, MD;  Location: [facility];  Service: Obstetrics;  Laterality: N/A;  . WISDOM TOOTH EXTRACTION     OB History  Gravida Para Term Preterm AB Living  4 3 3  0 0 3  SAB TAB Ectopic Multiple Live Births  0 0 0 0 3    # Outcome Date GA Lbr Len/2nd Weight Sex Delivery Anes PTL Lv  4 Current           3 Term 11/07/15 443w5d  4 lb 7.4 oz (2.025 kg) M CS-LTranv Spinal  LIV  2 Term 01/29/12 138w5d 12:31 / 00:15 7 lb 13 oz (3.544 kg) M Vag-Spont EPI  LIV  1 Term 03/10/10 6473w0d  6 lb 5 oz (2.863 kg) M Vag-Spont EPI  LIV      Social History   Social History  . Marital status: Single    Spouse name: N/A  . Number of children: N/A  . Years of education: N/A   Social History Main Topics  . Smoking status: Current Some Day Smoker    Packs/day: 0.25    Years: 6.00    Types: Cigarettes  . Smokeless tobacco: Never Used  . Alcohol use No  . Drug use:     Types: Marijuana     Comment: once in a blue moon  . Sexual activity: Yes    Birth control/ protection: None   Other Topics Concern  . None   Social History Narrative  . None   Family History  Problem Relation Age of Onset  . Heart disease Mother     hole in heart  . Cancer Maternal Grandmother    No Known Allergies  Prescriptions Prior to Admission  Medication Sig Dispense Refill Last Dose  . acetaminophen (TYLENOL) 500 MG tablet Take 1,000 mg by mouth every 6 (six) hours as needed.   Past Week at Unknown time  . amLODipine (NORVASC) 10 MG tablet Take 1 tablet (10 mg total) by mouth daily. 30 tablet 1 Past Month at Unknown time  . Prenatal Multivit-Min-Fe-FA (PRENATAL VITAMINS PO) Take 1 tablet by mouth daily.   Past Week at Unknown time   Review  of Systems - Negative except lower belly pain and vaginal pain  Vitals:  BP (!) 103/54 (BP Location: Right Arm)   Pulse 82   Temp 98.4 F (36.9 C) (Oral)   Resp 20   Ht 5\' 3"  (1.6 m)   Wt 252 lb (114.3 kg)   LMP 01/31/2016   SpO2 98%   BMI 44.64 kg/m  Physical Examination: CONSTITUTIONAL: Well-developed, obese, well-nourished female in no acute distress.  HEENT:  NCAT, External right and left ear normal. Oropharynx is clear and moist EYES: Conjunctivae and EOM are normal. Pupils are equal, round, and reactive to light. No scleral icterus.  NECK: Normal range of motion, supple, no masses SKIN: Skin is warm and dry. No rash noted. Not diaphoretic. No erythema. No pallor. NEUROLGIC: Alert and oriented to person, place, and time. Normal reflexes, muscle tone coordination. No cranial nerve  deficit noted. PSYCHIATRIC: Normal mood and affect. Normal behavior. Normal judgment and thought content. CARDIOVASCULAR: Normal heart rate noted, regular rhythm RESPIRATORY: Effort and breath sounds normal, no problems with respiration noted ABDOMEN: Soft, nontender, nondistended, gravid. MUSCULOSKELETAL: Normal range of motion. No edema and no tenderness. 2+ distal pulses.  Cervix: Evaluated by digital exam. and found to be 1 cm/ 100%/-2 and fetal presentation is cephalic Membranes:intact Fetal Monitoring:Baseline: 140s bpm Tocometer: Flat   Labs:  Results for orders placed or performed in visit on 10/12/16 (from the past 24 hour(s))  POCT urinalysis dip (device)   Collection Time: 10/12/16 10:26 AM  Result Value Ref Range   Glucose, UA NEGATIVE NEGATIVE mg/dL   Bilirubin Urine NEGATIVE NEGATIVE   Ketones, ur NEGATIVE NEGATIVE mg/dL   Specific Gravity, Urine 1.020 1.005 - 1.030   Hgb urine dipstick NEGATIVE NEGATIVE   pH 6.0 5.0 - 8.0   Protein, ur 30 (A) NEGATIVE mg/dL   Urobilinogen, UA 1.0 0.0 - 1.0 mg/dL   Nitrite NEGATIVE NEGATIVE   Leukocytes, UA NEGATIVE NEGATIVE  Glucose, capillary   Collection Time: 10/12/16 11:51 AM  Result Value Ref Range   Glucose-Capillary 85 65 - 99 mg/dL  Results for orders placed or performed during the hospital encounter of 10/12/16 (from the past 24 hour(s))  Urinalysis, Routine w reflex microscopic   Collection Time: 10/12/16 12:59 AM  Result Value Ref Range   Color, Urine STRAW (A) YELLOW   APPearance CLEAR CLEAR   Specific Gravity, Urine 1.004 (L) 1.005 - 1.030   pH 6.0 5.0 - 8.0   Glucose, UA NEGATIVE NEGATIVE mg/dL   Hgb urine dipstick NEGATIVE NEGATIVE   Bilirubin Urine NEGATIVE NEGATIVE   Ketones, ur NEGATIVE NEGATIVE mg/dL   Protein, ur NEGATIVE NEGATIVE mg/dL   Nitrite NEGATIVE NEGATIVE   Leukocytes, UA NEGATIVE NEGATIVE   Imaging Studies: No results found.   Assessment and Plan: Patient Active Problem List   Diagnosis  Date Noted  . Mental disorder 10/12/2016  . Preterm labor in third trimester 10/12/2016  . Pregnancy, supervision, high-risk 07/16/2016  . History of syphilis 07/16/2016  . History of cesarean delivery 07/16/2016  . Bipolar disease during pregnancy (HCC) 07/16/2016  . BMI 45.0-49.9, adult (HCC) 07/16/2016  . Obesity in pregnancy 07/16/2016  . Short interval between pregnancies affecting pregnancy, antepartum 07/16/2016  . History of prior pregnancy with SGA newborn 07/16/2016  . History of pre-eclampsia 11/07/2015    1. Pre-term contractions: Give 1L bolus upon arrival. Tocometry with no contractions. Betamethasone given at 1430. Repeat in 24 hours. Plan to give procardia if contraction start. Continuous tocmetry. q shift  NST.  Given no contractions will recheck cervix in AM and D/C after second betamethasone.   Routine antenatal care  Freddrick March, MD  PGY-1 Faculty Practice, [facility]  OB FELLOW HISTORY AND PHYSICAL ATTESTATION  I have seen and examined this patient; I agree with above documentation in the resident's note.    [name] 10/12/2016, 7:50 PM

## 2016-10-12 NOTE — MAU Provider Note (Signed)
History   [id number]   Chief Complaint  Patient presents with  . Vaginal Pain    HPI Mitzi Davenportrenese Wiegman is a 30 y.o. female  [phone] at 7491w5d IUP here with report of lower cramping that started last nigth.  Proceeded to have intercourse and began to feel irregular contractions after.  Pain is intermittent in nature.  Denies vaginal bleeding or leaking of fluid.  +fetal movement.     Patient's last menstrual period was 01/31/2016.  OB History  Gravida Para Term Preterm AB Living  4 3 3  0 0 3  SAB TAB Ectopic Multiple Live Births  0 0 0 0 3    # Outcome Date GA Lbr Len/2nd Weight Sex Delivery Anes PTL Lv  4 Current           3 Term 11/07/15 8677w5d  4 lb 7.4 oz (2.025 kg) M CS-LTranv Spinal  LIV  2 Term 01/29/12 294w5d 12:31 / 00:15 7 lb 13 oz (3.544 kg) M Vag-Spont EPI  LIV  1 Term 03/10/10 5697w0d  6 lb 5 oz (2.863 kg) M Vag-Spont EPI  LIV      Past Medical History:  Diagnosis Date  . Chlamydia   . Gonorrhea   . Infection    UTI  . Ovarian cyst   . Syphilis     Family History  Problem Relation Age of Onset  . Heart disease Mother     hole in heart  . Cancer Maternal Grandmother     Social History   Social History  . Marital status: Single    Spouse name: N/A  . Number of children: N/A  . Years of education: N/A   Social History Main Topics  . Smoking status: Current Some Day Smoker    Packs/day: 0.25    Years: 6.00    Types: Cigarettes  . Smokeless tobacco: Never Used  . Alcohol use No  . Drug use:     Types: Marijuana     Comment: once in a blue moon  . Sexual activity: Yes    Birth control/ protection: None   Other Topics Concern  . None   Social History Narrative  . None    No Known Allergies  No current facility-administered medications on file prior to encounter.    Current Outpatient Prescriptions on File Prior to Encounter  Medication Sig Dispense Refill  . acetaminophen (TYLENOL) 500 MG tablet Take 1,000 mg by mouth every 6 (six) hours as  needed.    Marland Kitchen. amLODipine (NORVASC) 10 MG tablet Take 1 tablet (10 mg total) by mouth daily. (Patient not taking: Reported on 07/16/2016) 30 tablet 1  . ibuprofen (ADVIL,MOTRIN) 600 MG tablet Take 1 tablet (600 mg total) by mouth every 6 (six) hours. (Patient not taking: Reported on 07/16/2016) 30 tablet 0  . oxyCODONE-acetaminophen (PERCOCET/ROXICET) 5-325 MG tablet Take 1 tablet by mouth every 4 (four) hours as needed for moderate pain or severe pain. (Patient not taking: Reported on 07/16/2016) 30 tablet 0     Review of Systems  Constitutional: Negative for chills and fever.  Genitourinary: Positive for pelvic pain (contractions) and vaginal pain (groin pain). Negative for dysuria, flank pain and vaginal bleeding.  All other systems reviewed and are negative.    Physical Exam   Vitals:   10/12/16 0104  BP: 106/69  Pulse: 85  Resp: 20  Temp: 97.7 F (36.5 C)  TempSrc: Oral  SpO2: 99%  Weight: 254 lb (115.2 kg)  Height: 5\' 3"  (1.6  m)    Physical Exam  Constitutional: She is oriented to person, place, and time. She appears well-developed and well-nourished.  HENT:  Head: Normocephalic.  Neck: Normal range of motion. Neck supple.  Cardiovascular: Normal rate, regular rhythm and normal heart sounds.   Respiratory: Effort normal and breath sounds normal. No respiratory distress.  GI: Soft. There is no tenderness.  Genitourinary: No bleeding in the vagina. Vaginal discharge (mucusy) found.  Musculoskeletal: Normal range of motion. She exhibits no edema.  Neurological: She is alert and oriented to person, place, and time.  Skin: Skin is warm and dry.   Dilation: Closed Effacement (%): Thick Cervical Position: Middle Exam by:: Margarita MailW. Karim, CNM   FHR 122, +accels Toco - occasional MAU Course  Procedures  MDM Results for orders placed or performed during the hospital encounter of 10/12/16 (from the past 24 hour(s))  Urinalysis, Routine w reflex microscopic     Status: Abnormal    Collection Time: 10/12/16 12:59 AM  Result Value Ref Range   Color, Urine STRAW (A) YELLOW   APPearance CLEAR CLEAR   Specific Gravity, Urine 1.004 (L) 1.005 - 1.030   pH 6.0 5.0 - 8.0   Glucose, UA NEGATIVE NEGATIVE mg/dL   Hgb urine dipstick NEGATIVE NEGATIVE   Bilirubin Urine NEGATIVE NEGATIVE   Ketones, ur NEGATIVE NEGATIVE mg/dL   Protein, ur NEGATIVE NEGATIVE mg/dL   Nitrite NEGATIVE NEGATIVE   Leukocytes, UA NEGATIVE NEGATIVE     Assessment and Plan  [age] y.o. [id number] at 4120w5d IUP  Braxton Hick's - s/p intercourse Round Ligament Pain  Plan: Discharge home Discussed intercourse and impact on contractions Keep scheduled follow-up appointment  Walidah N Karim, CNM 10/12/2016 1:19 AM

## 2016-10-12 NOTE — Discharge Instructions (Signed)
Braxton Hicks Contractions °Contractions of the uterus can occur throughout pregnancy. Contractions are not always a sign that you are in labor.  °WHAT ARE BRAXTON HICKS CONTRACTIONS?  °Contractions that occur before labor are called Braxton Hicks contractions, or false labor. Toward the end of pregnancy (32-34 weeks), these contractions can develop more often and may become more forceful. This is not true labor because these contractions do not result in opening (dilatation) and thinning of the cervix. They are sometimes difficult to tell apart from true labor because these contractions can be forceful and people have different pain tolerances. You should not feel embarrassed if you go to the hospital with false labor. Sometimes, the only way to tell if you are in true labor is for your health care provider to look for changes in the cervix. °If there are no prenatal problems or other health problems associated with the pregnancy, it is completely safe to be sent home with false labor and await the onset of true labor. °HOW CAN YOU TELL THE DIFFERENCE BETWEEN TRUE AND FALSE LABOR? °False Labor  °· The contractions of false labor are usually shorter and not as hard as those of true labor.   °· The contractions are usually irregular.   °· The contractions are often felt in the front of the lower abdomen and in the groin.   °· The contractions may go away when you walk around or change positions while lying down.   °· The contractions get weaker and are shorter lasting as time goes on.   °· The contractions do not usually become progressively stronger, regular, and closer together as with true labor.   °True Labor  °· Contractions in true labor last 30-70 seconds, become very regular, usually become more intense, and increase in frequency.   °· The contractions do not go away with walking.   °· The discomfort is usually felt in the top of the uterus and spreads to the lower abdomen and low back.   °· True labor can be  determined by your health care provider with an exam. This will show that the cervix is dilating and getting thinner.   °WHAT TO REMEMBER °· Keep up with your usual exercises and follow other instructions given by your health care provider.   °· Take medicines as directed by your health care provider.   °· Keep your regular prenatal appointments.   °· Eat and drink lightly if you think you are going into labor.   °· If Braxton Hicks contractions are making you uncomfortable:   °¨ Change your position from lying down or resting to walking, or from walking to resting.   °¨ Sit and rest in a tub of warm water.   °¨ Drink 2-3 glasses of water. Dehydration may cause these contractions.   °¨ Do slow and deep breathing several times an hour.   °WHEN SHOULD I SEEK IMMEDIATE MEDICAL CARE? °Seek immediate medical care if: °· Your contractions become stronger, more regular, and closer together.   °· You have fluid leaking or gushing from your vagina.   °· You have a fever.   °· You pass blood-tinged mucus.   °· You have vaginal bleeding.   °· You have continuous abdominal pain.   °· You have low back pain that you never had before.   °· You feel your baby's head pushing down and causing pelvic pressure.   °· Your baby is not moving as much as it used to.   °This information is not intended to replace advice given to you by your health care provider. Make sure you discuss any questions you have with your health care   provider. °Document Released: 10/12/2005 Document Revised: 02/03/2016 Document Reviewed: 07/24/2013 °Elsevier Interactive Patient Education © 2017 [employer]vier Inc. ° °

## 2016-10-12 NOTE — MAU Note (Signed)
Pt received by EMS with vaginal pain that started tonight at 9pm, worsened after intercourse, constant pain in lower abdomen. Denies any bleeding or leaking of fluid.

## 2016-10-12 NOTE — BH Specialist Note (Signed)
Session Start time: 9:00   End Time: 9:20 Total Time:  20 minutes Type of Service: Behavioral Health - Individual/Family Interpreter: No.   Interpreter Name & Language: n/a # [facility]  SUBJECTIVE: Carla Carson is a 30 y.o. female  Pt. was referred by Dr Penne LashLeggett for:  anxiety and depression. Pt. reports the following symptoms/concerns: Pt states that she has bipolar affective disorder, does not want to take any BH meds in pregnancy, and would like to find healthy ways to cope with feelings of anxiousness, including an increase in worry and irritability. Duration of problem:  "years" Severity: moderate Previous treatment: Treated for bipolar disorder in past, not interested in psychiatry or medications at this time  OBJECTIVE: Mood: Appropriate & Affect: Appropriate Risk of harm to self or others: No known risk of harm to self or others Assessments administered: PHQ9: 12/ GAD7: 14  LIFE CONTEXT:  Family & Social: Lives with children, ages 396,4,1; some conflict with two fathers of children School/ Work: undetermined Self-Care: has found "ways" to cope with stress that she does not wish to discuss; finds it helpful to help others Life changes: Current pregnancy What is important to pt/family (values): Healthy baby  GOALS ADDRESSED:  -Reduce symptoms of depression and anxiety, related to bipolar disorder  INTERVENTIONS: Motivational Interviewing   ASSESSMENT:  Pt currently experiencing Bipolar affective disorder.  Pt may benefit from psychoeducation and brief therapeutic interventions regarding coping with symptoms of anxiety and depression related to bipolar disorder.   PLAN: 1. F/U with behavioral health clinician: Two weeks 2. Behavioral Health meds: none 3. Behavioral recommendations:  -Consider establishing care with psychiatry prior to childbirth(will discuss options further at next visit) -Use Calm and Relax Melodies apps for additional self-care  strategies -Read educational material regarding coping with symptoms of anxiety and depression 4. Referral: Brief Counseling/Psychotherapy and Psychoeducation 5. From scale of 1-10, how likely are you to follow plan: 10  Gaynell FaceJamie C Leolia Vinzant LCSWA Behavioral Health Clinician  Warmhandoff:   Warm Hand Off Completed.         Depression screen PHQ 2/9 10/12/2016  Decreased Interest 0  Down, Depressed, Hopeless 3  PHQ - 2 Score 3  Altered sleeping 3  Tired, decreased energy 3  Change in appetite 3  Feeling bad or failure about yourself  0  Trouble concentrating 0  Moving slowly or fidgety/restless 0  Suicidal thoughts 0  PHQ-9 Score 12   GAD 7 : Generalized Anxiety Score 10/12/2016  Nervous, Anxious, on Edge 0  Control/stop worrying 3  Worry too much - different things 3  Trouble relaxing 1  Restless 1  Easily annoyed or irritable 3  Afraid - awful might happen 3  Total GAD 7 Score 14

## 2016-10-12 NOTE — Progress Notes (Signed)
States hasn't ate since last night, but does not want to stay any longer for 2 hr gtt. Came to MAU last night abd pain and cramping.   RN Zeyfang    PRENATAL VISIT NOTE  Subjective:  Carla Carson is a 30 y.o. G4P3003 at 8427w5d being seen today for ongoing prenatal care.  She is currently monitored for the following issues for this high-risk pregnancy and has History of pre-eclampsia; Pregnancy, supervision, high-risk; History of syphilis; History of cesarean delivery; Bipolar disease during pregnancy (HCC); BMI 45.0-49.9, adult (HCC); Obesity in pregnancy; Short interval between pregnancies affecting pregnancy, antepartum; History of prior pregnancy with SGA newborn; and Mental disorder on her problem list.  Patient reports abdominal pain that has continued since visit this morning..  Contractions: Not present. Vag. Bleeding: None.  Movement: Present. Denies leaking of fluid.   The following portions of the patient's history were reviewed and updated as appropriate: allergies, current medications, past family history, past medical history, past social history, past surgical history and problem list. Problem list updated.  Objective:   Vitals:   10/12/16 1131  BP: 112/65  Pulse: 93  Weight: 252 lb 11.2 oz (114.6 kg)    Fetal Status: Fetal Heart Rate (bpm): 137   Movement: Present     General:  Alert, oriented and cooperative. Patient is in no acute distress.  Skin: Skin is warm and dry. No rash noted.   Cardiovascular: Normal heart rate noted  Respiratory: Normal respiratory effort, no problems with respiration noted  Abdomen: Soft, gravid, appropriate for gestational age. Pain/Pressure: Present     Pelvic:  Cervical exam performed        Extremities: Normal range of motion.  Edema: None  Mental Status: Normal mood and affect. Normal behavior. Normal judgment and thought content.   Assessment and Plan:  Pregnancy: G4P3003 at 2027w5d  1. History of pre-eclampsia  - pt not taking  baby asa as prescribed.  BP normal  2. Bipolar disease during pregnancy in third trimester (HCC) -stable, does not wish to talk with LCSW today but has liked her in the past.  4. History of prior pregnancy with SGA newborn - US MFM OB FOLLOW UP; Future  (growth)  5. Preterm Contractions Pt made change from 1 am this morning.  Will admit for obs and betamethasone.  FFN tomorrow.    Preterm labor symptoms and general obstetric precautions including but not limited to vaginal bleeding, contractions, leaking of fluid and fetal movement were reviewed in detail with the patient. Please refer to After Visit Summary for other counseling recommendations.  Return in 2 weeks (on 10/26/2016).   Lesly DukesKelly H Leggett, MD

## 2016-10-13 LAB — CULTURE, BETA STREP (GROUP B ONLY)

## 2016-10-13 LAB — OB RESULTS CONSOLE GBS: STREP GROUP B AG: POSITIVE

## 2016-10-13 MED ORDER — PSYLLIUM 28 % PO PACK: 1.0000 | PACK | Freq: Two times a day (BID) | ORAL | Status: DC

## 2016-10-13 NOTE — Discharge Summary (Signed)
Discharge Summary   Admit Date: 10/12/2016 Discharge Date: 10/20/2016 Discharging Service: Antepartum  Primary OBGYN: [facility] Admitting Physician: Levie HeritageJacob J Stinson, DO  Discharge Physician: Vergie LivingPickens, MD  Referring Provider: MAU  Primary Care Provider: No PCP Per Patient  Admission Diagnoses: *Pregnancy at 32/5 weeks *Preterm contractions *1-1.5cm cervical dilation  Discharge Diagnoses: *Pregnancy at 32/6 weeks *1-1.5cm cervical dilation  Consult Orders: CONSULT TO CARE MANAGEMENT   Surgeries/Procedures Performed: None  History and Physical: Attestation signed by Levie HeritageJacob J Stinson, DO at 10/14/2016 1:03 PM  Attestation of Attending Supervision of Obstetric Fellow: Evaluation and management procedures were performed by the Obstetric Fellow under my supervision and collaboration.  I have reviewed the Obstetric Fellow's note and chart, and I agree with the management and plan.  Carla CelesteJacob Stinson, DO Attending Physician Faculty Practice, [facility]     Expand All Collapse All   [] Hide copied text [] Hover for attribution information ANTEPARTUM ADMISSION HISTORY AND PHYSICAL NOTE  History of Present Illness: Carla Carson is a 30 y.o. G4P3003 at 4135w5d admitted for preterm contractions. Patient reports the fetal movement as active. Patient reports uterine contraction  activity as irregular, patient states unknown frequency. Patient reports  vaginal bleeding as none. Patient describes fluid per vagina as None. Fetal presentation is cephalic.  She was seen yesterday and told she was closed. Today in office she was 1-1.5cm. She was then direct admitted to ANTEnatal for observations.       Patient Active Problem List   Diagnosis Date Noted  . Mental disorder 10/12/2016  . Preterm labor in third trimester 10/12/2016  . Pregnancy, supervision, high-risk 07/16/2016  . History of syphilis 07/16/2016  . History of cesarean  delivery 07/16/2016  . Bipolar disease during pregnancy (HCC) 07/16/2016  . BMI 45.0-49.9, adult (HCC) 07/16/2016  . Obesity in pregnancy 07/16/2016  . Short interval between pregnancies affecting pregnancy, antepartum 07/16/2016  . History of prior pregnancy with SGA newborn 07/16/2016  . History of pre-eclampsia 11/07/2015       Past Medical History:  Diagnosis Date  . Chlamydia   . Gonorrhea   . Infection    UTI  . Ovarian cyst   . Syphilis         Past Surgical History:  Procedure Laterality Date  . CESAREAN SECTION N/A 11/07/2015   Procedure: CESAREAN SECTION;  Surgeon: Willodean Rosenthalarolyn Harraway-Smith, MD;  Location: [facility];  Service: Obstetrics;  Laterality: N/A;  . WISDOM TOOTH EXTRACTION                     OB History  Gravida Para Term Preterm AB Living  4 3 3  0 0 3  SAB TAB Ectopic Multiple Live Births  0 0 0 0 3    # Outcome Date GA Lbr Len/2nd Weight Sex Delivery Anes PTL Lv  4 Current           3 Term 11/07/15 4985w5d  4 lb 7.4 oz (2.025 kg) M CS-LTranv Spinal  LIV  2 Term 01/29/12 7570w5d 12:31 / 00:15 7 lb 13 oz (3.544 kg) M Vag-Spont EPI  LIV  1 Term 03/10/10 684w0d  6 lb 5 oz (2.863 kg) M Vag-Spont EPI  LIV     Social History        Social History  . Marital status: Single    Spouse name: N/A  . Number of children: N/A  . Years of education: N/A         Social  History Main Topics  . Smoking status: Current Some Day Smoker    Packs/day: 0.25    Years: 6.00    Types: Cigarettes  . Smokeless tobacco: Never Used  . Alcohol use No  . Drug use:     Types: Marijuana     Comment: once in a blue moon  . Sexual activity: Yes    Birth control/ protection: None       Other Topics Concern  . None      Social History Narrative  . None         Family History  Problem Relation Age of Onset  . Heart disease Mother     hole in heart  . Cancer Maternal Grandmother    No Known Allergies  Prescriptions  Prior to Admission  Medication Sig Dispense Refill Last Dose  . acetaminophen (TYLENOL) 500 MG tablet Take 1,000 mg by mouth every 6 (six) hours as needed.   Past Week at Unknown time  . amLODipine (NORVASC) 10 MG tablet Take 1 tablet (10 mg total) by mouth daily. 30 tablet 1 Past Month at Unknown time  . Prenatal Multivit-Min-Fe-FA (PRENATAL VITAMINS PO) Take 1 tablet by mouth daily.   Past Week at Unknown time   Review of Systems - Negative except lower belly pain and vaginal pain  Vitals:  BP (!) 103/54 (BP Location: Right Arm)   Pulse 82   Temp 98.4 F (36.9 C) (Oral)   Resp 20   Ht 5\' 3"  (1.6 m)   Wt 252 lb (114.3 kg)   LMP 01/31/2016   SpO2 98%   BMI 44.64 kg/m  Physical Examination: CONSTITUTIONAL: Well-developed, obese, well-nourished female in no acute distress.  HEENT:  NCAT, External right and left ear normal. Oropharynx is clear and moist EYES: Conjunctivae and EOM are normal. Pupils are equal, round, and reactive to light. No scleral icterus.  NECK: Normal range of motion, supple, no masses SKIN: Skin is warm and dry. No rash noted. Not diaphoretic. No erythema. No pallor. NEUROLGIC: Alert and oriented to person, place, and time. Normal reflexes, muscle tone coordination. No cranial nerve deficit noted. PSYCHIATRIC: Normal mood and affect. Normal behavior. Normal judgment and thought content. CARDIOVASCULAR: Normal heart rate noted, regular rhythm RESPIRATORY: Effort and breath sounds normal, no problems with respiration noted ABDOMEN: Soft, nontender, nondistended, gravid. MUSCULOSKELETAL: Normal range of motion. No edema and no tenderness. 2+ distal pulses.  Cervix: Evaluated by digital exam. and found to be 1 cm/ 100%/-2 and fetal presentation is cephalic Membranes:intact Fetal Monitoring:Baseline: 140s bpm Tocometer: Flat   Labs:  Results for orders placed or performed in visit on 10/12/16 (from the past 24 hour(s))  POCT urinalysis dip (device)    Collection Time: 10/12/16 10:26 AM  Result Value Ref Range   Glucose, UA NEGATIVE NEGATIVE mg/dL   Bilirubin Urine NEGATIVE NEGATIVE   Ketones, ur NEGATIVE NEGATIVE mg/dL   Specific Gravity, Urine 1.020 1.005 - 1.030   Hgb urine dipstick NEGATIVE NEGATIVE   pH 6.0 5.0 - 8.0   Protein, ur 30 (A) NEGATIVE mg/dL   Urobilinogen, UA 1.0 0.0 - 1.0 mg/dL   Nitrite NEGATIVE NEGATIVE   Leukocytes, UA NEGATIVE NEGATIVE  Glucose, capillary   Collection Time: 10/12/16 11:51 AM  Result Value Ref Range   Glucose-Capillary 85 65 - 99 mg/dL  Results for orders placed or performed during the hospital encounter of 10/12/16 (from the past 24 hour(s))  Urinalysis, Routine w reflex microscopic   Collection Time: 10/12/16  12:59 AM  Result Value Ref Range   Color, Urine STRAW (A) YELLOW   APPearance CLEAR CLEAR   Specific Gravity, Urine 1.004 (L) 1.005 - 1.030   pH 6.0 5.0 - 8.0   Glucose, UA NEGATIVE NEGATIVE mg/dL   Hgb urine dipstick NEGATIVE NEGATIVE   Bilirubin Urine NEGATIVE NEGATIVE   Ketones, ur NEGATIVE NEGATIVE mg/dL   Protein, ur NEGATIVE NEGATIVE mg/dL   Nitrite NEGATIVE NEGATIVE   Leukocytes, UA NEGATIVE NEGATIVE   Imaging Studies: ImagingResults  No results found.     Assessment and Plan:     Patient Active Problem List   Diagnosis Date Noted  . Mental disorder 10/12/2016  . Preterm labor in third trimester 10/12/2016  . Pregnancy, supervision, high-risk 07/16/2016  . History of syphilis 07/16/2016  . History of cesarean delivery 07/16/2016  . Bipolar disease during pregnancy (HCC) 07/16/2016  . BMI 45.0-49.9, adult (HCC) 07/16/2016  . Obesity in pregnancy 07/16/2016  . Short interval between pregnancies affecting pregnancy, antepartum 07/16/2016  . History of prior pregnancy with SGA newborn 07/16/2016  . History of pre-eclampsia 11/07/2015    1. Pre-term contractions: Give 1L bolus upon arrival. Tocometry with no contractions. Betamethasone  given at 1430. Repeat in 24 hours. Plan to give procardia if contraction start. Continuous tocmetry. q shift NST.  Given no contractions will recheck cervix in AM and D/C after second betamethasone.   Routine antenatal care  Carla MarchYashika Amin, MD  PGY-1 Faculty Practice, [facility]  OB FELLOW HISTORY AND PHYSICAL ATTESTATION  I have seen and examined this patient; I agree with above documentation in the resident's note.    Carla Carson      Hospital Course: UCs resolved on EFM and symptomatically with the patient. Repeat SVE was unchanged from admission. She was discharged after 2nd dose of BMZ.    Discharge Exam   Current Vital Signs 24h Vital Sign Ranges  T 98.6 F (37 C) Temp  Avg: 98.1 F (36.7 C)  Min: 97.9 F (36.6 C)  Max: 98.6 F (37 C)  BP 118/63 BP  Min: 102/45  Max: 121/79  HR 86 Pulse  Avg: 80.5  Min: 75  Max: 86  RR 20 Resp  Avg: 20  Min: 20  Max: 20  SaO2 100 % Not Delivered SpO2  Avg: 100 %  Min: 100 %  Max: 100 %       24 Hour I/O Current Shift I/O  Time Ins Outs No intake/output data recorded. No intake/output data recorded.   General appearance: Well nourished, well developed female in no acute distress.  Neck:  Supple, normal appearance, and no thyromegaly  Cardiovascular: S1, S2 normal, no murmur, rub or gallop, regular rate and rhythm Respiratory:  Clear to auscultation bilateral. Normal respiratory effort Abdomen: Gravid, diffusely non tender to palpation, non distended Neuro/Psych:  Normal mood and affect.  Skin:  Warm and dry.  Lymphatic:  No inguinal lymphadenopathy.   1-1.5/25/high  Discharge Disposition:  Home  Patient Instructions:  Standard   Results Pending at Discharge:  GBS  Discharge Medications: Allergies as of 10/13/2016   No Known Allergies     Medication List    STOP taking these medications   amLODipine 10 MG tablet Commonly known as:  NORVASC     TAKE these medications     acetaminophen 500 MG tablet Commonly known as:  TYLENOL Take 1,000 mg by mouth every 6 (six) hours as needed.   PRENATAL VITAMINS PO Take  1 tablet by mouth daily.   psyllium 28 % packet Commonly known as:  METAMUCIL SMOOTH TEXTURE Take 1 packet by mouth 2 (two) times daily.        Future Appointments Date Time Provider Department Center  10/29/2016 10:20 AM Hermina Staggers, MD [facility]    Cornelia Copa MD Attending Center for [facility]
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
## 2016-10-13 NOTE — Progress Notes (Signed)
Discharge instructions given, questions answered, states understanding, signed and given copy. Bus pass given, school/work note given.

## 2016-10-26 NOTE — L&D Delivery Note (Signed)
OB Delivery Summary  31 y.o. [id number] at 5049w1d delivered a viable female infant in cephalic, LOA position. This was a successful VBAC. There was a nuchal cord x 2, that was easily reduced. The anterior shoulder delivered with ease. A 60 sec delayed cord clamping was performed. Cord clamped x2 and cut. Placenta delivered spontaneously intact, with 3VC. Fundus firm on exam with massage and pitocin. Good hemostasis noted.  Anesthesia: Epidural Laceration: None Suture: N/A Good hemostasis noted. EBL: 150cc  Mom and baby recovering in LDR.    Apgars: APGAR (1 MIN):  8 APGAR (5 MINS):  9 Weight: Pending skin to skin    Gorden HarmsMegan Campbell, MD PGY-2 11/26/2016, 2:33 AM  Patient is a [id number] at 6649w1d who was admitted in SOL, significant hx of prev C/S.  She progressed without augmentation.  I was gloved and present for delivery in its entirety.  Second stage of labor progressed, baby delivered after a few contractions.  No decels during second stage noted.  Complications: nuchal x 2, easily reduced  Lacerations: none  EBL: 150cc  Plans PP BTL in AM.  Cam HaiSHAW, Brigett Estell, CNM 2:51 AM  11/26/2016

## 2016-10-29 ENCOUNTER — Ambulatory Visit (INDEPENDENT_AMBULATORY_CARE_PROVIDER_SITE_OTHER): Payer: Self-pay | Admitting: Obstetrics and Gynecology

## 2016-10-29 VITALS — BP 117/87 | HR 102 | Wt 251.3 lb

## 2016-10-29 DIAGNOSIS — Z98891 History of uterine scar from previous surgery: Secondary | ICD-10-CM

## 2016-10-29 DIAGNOSIS — Z8759 Personal history of other complications of pregnancy, childbirth and the puerperium: Secondary | ICD-10-CM

## 2016-10-29 DIAGNOSIS — Z113 Encounter for screening for infections with a predominantly sexual mode of transmission: Secondary | ICD-10-CM | POA: Diagnosis not present

## 2016-10-29 DIAGNOSIS — O99820 Streptococcus B carrier state complicating pregnancy: Secondary | ICD-10-CM

## 2016-10-29 DIAGNOSIS — O0993 Supervision of high risk pregnancy, unspecified, third trimester: Secondary | ICD-10-CM

## 2016-10-29 NOTE — Progress Notes (Signed)
Subjective:  Carla Carson is a 31 y.o. G4P3003 at 6967w1d being seen today for ongoing prenatal care.  She is currently monitored for the following issues for this high-risk pregnancy and has History of pre-eclampsia; Pregnancy, supervision, high-risk; History of syphilis; History of cesarean delivery; Bipolar disease during pregnancy (HCC); BMI 45.0-49.9, adult (HCC); Obesity in pregnancy; GBS (group B Streptococcus carrier), +RV culture, currently pregnant; History of prior pregnancy with SGA newborn; Mental disorder; and Preterm labor in third trimester on her problem list.  Patient reports no complaints.  Contractions: Irregular. Vag. Bleeding: None.  Movement: Present. Denies leaking of fluid.   The following portions of the patient's history were reviewed and updated as appropriate: allergies, current medications, past family history, past medical history, past social history, past surgical history and problem list. Problem list updated.  Objective:   Vitals:   10/29/16 1047  BP: 117/87  Pulse: (!) 102  Weight: 251 lb 4.8 oz (114 kg)    Fetal Status: Fetal Heart Rate (bpm): 140   Movement: Present     General:  Alert, oriented and cooperative. Patient is in no acute distress.  Skin: Skin is warm and dry. No rash noted.   Cardiovascular: Normal heart rate noted  Respiratory: Normal respiratory effort, no problems with respiration noted  Abdomen: Soft, gravid, appropriate for gestational age. Pain/Pressure: Absent     Pelvic:  Cervical exam performed        Extremities: Normal range of motion.  Edema: None  Mental Status: Normal mood and affect. Normal behavior. Normal judgment and thought content.   Urinalysis:      Assessment and Plan:  Pregnancy: G4P3003 at 5567w1d  1. Supervision of high risk pregnancy in third trimester  - [facility]Chlam[facility]  2. History of pre-eclampsia BP stable, pt declined BASA  3. History of cesarean delivery TOLAC  consent signed with BTL  4. GBS (group B Streptococcus carrier), +RV culture, currently pregnant Treat while in labor  5. Preterm labor in third trimester without delivery S/P BMZ  Preterm labor symptoms and general obstetric precautions including but not limited to vaginal bleeding, contractions, leaking of fluid and fetal movement were reviewed in detail with the patient. Please refer to After Visit Summary for other counseling recommendations.  Return in about 1 week (around 11/05/2016) for OB visit.   Hermina StaggersMichael L Earle Troiano, MD

## 2016-10-29 NOTE — Patient Instructions (Signed)

## 2016-10-30 ENCOUNTER — Encounter (HOSPITAL_COMMUNITY): Payer: Self-pay

## 2016-10-30 ENCOUNTER — Inpatient Hospital Stay (HOSPITAL_COMMUNITY)
Admission: AD | Admit: 2016-10-30 | Discharge: 2016-10-30 | Disposition: A | Discharge status: Home / Self Care | Payer: Medicaid Other | Source: Ambulatory Visit | Attending: Obstetrics & Gynecology | Admitting: Obstetrics & Gynecology

## 2016-10-30 ENCOUNTER — Inpatient Hospital Stay (HOSPITAL_COMMUNITY): Payer: Medicaid Other

## 2016-10-30 DIAGNOSIS — R1011 Right upper quadrant pain: Secondary | ICD-10-CM | POA: Diagnosis not present

## 2016-10-30 DIAGNOSIS — O26893 Other specified pregnancy related conditions, third trimester: Secondary | ICD-10-CM | POA: Diagnosis not present

## 2016-10-30 DIAGNOSIS — R109 Unspecified abdominal pain: Secondary | ICD-10-CM

## 2016-10-30 DIAGNOSIS — O34211 Maternal care for low transverse scar from previous cesarean delivery: Secondary | ICD-10-CM | POA: Insufficient documentation

## 2016-10-30 DIAGNOSIS — M549 Dorsalgia, unspecified: Secondary | ICD-10-CM | POA: Diagnosis not present

## 2016-10-30 DIAGNOSIS — O9989 Other specified diseases and conditions complicating pregnancy, childbirth and the puerperium: Secondary | ICD-10-CM

## 2016-10-30 DIAGNOSIS — O9A213 Injury, poisoning and certain other consequences of external causes complicating pregnancy, third trimester: Secondary | ICD-10-CM

## 2016-10-30 DIAGNOSIS — R1032 Left lower quadrant pain: Secondary | ICD-10-CM | POA: Insufficient documentation

## 2016-10-30 DIAGNOSIS — O99891 Other specified diseases and conditions complicating pregnancy: Secondary | ICD-10-CM

## 2016-10-30 DIAGNOSIS — O99333 Smoking (tobacco) complicating pregnancy, third trimester: Secondary | ICD-10-CM | POA: Diagnosis not present

## 2016-10-30 DIAGNOSIS — O26899 Other specified pregnancy related conditions, unspecified trimester: Secondary | ICD-10-CM

## 2016-10-30 DIAGNOSIS — W07XXXA Fall from chair, initial encounter: Secondary | ICD-10-CM | POA: Diagnosis not present

## 2016-10-30 DIAGNOSIS — F17210 Nicotine dependence, cigarettes, uncomplicated: Secondary | ICD-10-CM | POA: Insufficient documentation

## 2016-10-30 DIAGNOSIS — Z3A35 35 weeks gestation of pregnancy: Secondary | ICD-10-CM | POA: Diagnosis not present

## 2016-10-30 DIAGNOSIS — O34219 Maternal care for unspecified type scar from previous cesarean delivery: Secondary | ICD-10-CM

## 2016-10-30 LAB — URINALYSIS, ROUTINE W REFLEX MICROSCOPIC
Bilirubin Urine: NEGATIVE
GLUCOSE, UA: NEGATIVE mg/dL
HGB URINE DIPSTICK: NEGATIVE
Ketones, ur: NEGATIVE mg/dL
NITRITE: NEGATIVE
PH: 7 (ref 5.0–8.0)
PROTEIN: 30 mg/dL — AB
SPECIFIC GRAVITY, URINE: 1.017 (ref 1.005–1.030)

## 2016-10-30 LAB — GC/CHLAMYDIA PROBE AMP (~~LOC~~) NOT AT ARMC
Chlamydia: NEGATIVE
NEISSERIA GONORRHEA: NEGATIVE

## 2016-10-30 MED ORDER — CYCLOBENZAPRINE HCL 5 MG PO TABS: 5.0000 mg | ORAL_TABLET | Freq: Three times a day (TID) | ORAL | 0 refills | Status: DC | PRN

## 2016-10-30 MED ORDER — CYCLOBENZAPRINE HCL 5 MG PO TABS
5.0000 mg | ORAL_TABLET | Freq: Once | ORAL | Status: AC
  Administered 2016-10-30: 5 mg via ORAL
  Filled 2016-10-30: qty 1

## 2016-10-30 NOTE — MAU Provider Note (Signed)
Chief Complaint:  Back Pain and Abdominal Pain   First Provider Initiated Contact with Patient 10/30/16 1542     HPI: Carla Carson is a 31 y.o. G4P3003 at 60w2dwho presents to maternity admissions reporting abdoninal and back pain since fall yesterday. Fell backwards off chair.  . She reports good fetal movement, denies LOF, vaginal bleeding, vaginal itching/burning, urinary symptoms, h/a, dizziness, n/v, diarrhea, constipation or fever/chills.  She denies headache, visual changes or RUQ abdominal pain.  Back Pain  This is a new problem. The current episode started yesterday. The problem occurs constantly. The problem is unchanged. The pain is present in the lumbar spine. The pain does not radiate. The pain is moderate. The symptoms are aggravated by bending, lying down, position, standing and twisting. Associated symptoms include abdominal pain, headaches and pelvic pain. Pertinent negatives include no dysuria, fever, numbness, paresis, paresthesias or weakness. She has tried nothing for the symptoms.  Abdominal Pain  This is a new problem. The current episode started yesterday. The onset quality is gradual. The problem occurs constantly. The problem has been unchanged. The pain is located in the LLQ, RLQ and RUQ. The quality of the pain is aching and cramping. Associated symptoms include headaches. Pertinent negatives include no constipation, diarrhea, dysuria, fever, frequency or myalgias. The pain is aggravated by palpation, certain positions and movement. The pain is relieved by nothing. She has tried nothing for the symptoms.    Past Medical History: Past Medical History:  Diagnosis Date  . Chlamydia   . Gonorrhea   . Infection    UTI  . Ovarian cyst   . Syphilis     Past obstetric history: OB History  Gravida Para Term Preterm AB Living  4 3 3  0 0 3  SAB TAB Ectopic Multiple Live Births  0 0 0 0 3    # Outcome Date GA Lbr Len/2nd Weight Sex Delivery Anes PTL Lv  4 Current            3 Term 11/07/15 [redacted]w[redacted]d  4 lb 7.4 oz (2.025 kg) M CS-LTranv Spinal  LIV  2 Term 01/29/12 [redacted]w[redacted]d 12:31 / 00:15 7 lb 13 oz (3.544 kg) M Vag-Spont EPI  LIV  1 Term 03/10/10 [redacted]w[redacted]d  6 lb 5 oz (2.863 kg) M Vag-Spont EPI  LIV      Past Surgical History: Past Surgical History:  Procedure Laterality Date  . CESAREAN SECTION N/A 11/07/2015   Procedure: CESAREAN SECTION;  Surgeon: Willodean Rosenthal, MD;  Location: [facility];  Service: Obstetrics;  Laterality: N/A;  . WISDOM TOOTH EXTRACTION      Family History: Family History  Problem Relation Age of Onset  . Heart disease Mother     hole in heart  . Cancer Maternal Grandmother     Social History: Social History  Substance Use Topics  . Smoking status: Current Some Day Smoker    Packs/day: 0.25    Years: 6.00    Types: Cigarettes  . Smokeless tobacco: Never Used  . Alcohol use No    Allergies: No Known Allergies  Meds:  Prescriptions Prior to Admission  Medication Sig Dispense Refill Last Dose  . Prenatal Multivit-Min-Fe-FA (PRENATAL VITAMINS PO) Take 1 tablet by mouth every morning.    10/30/2016 at Unknown time  . psyllium (METAMUCIL SMOOTH TEXTURE) 28 % packet Take 1 packet by mouth 2 (two) times daily.   Past Month at Unknown time  . acetaminophen (TYLENOL) 500 MG tablet Take 1,000 mg by mouth every 6 (  six) hours as needed for moderate pain or headache.    Not Taking at Unknown time    I have reviewed patient's Past Medical Hx, Surgical Hx, Family Hx, Social Hx, medications and allergies.   ROS:  Review of Systems  Constitutional: Negative for fever.  Gastrointestinal: Positive for abdominal pain. Negative for constipation and diarrhea.  Genitourinary: Positive for pelvic pain. Negative for dysuria and frequency.  Musculoskeletal: Positive for back pain. Negative for myalgias.  Neurological: Positive for headaches. Negative for weakness, numbness and paresthesias.   Other systems negative  Physical Exam  Patient  Vitals for the past 24 hrs:  BP Temp Temp src Pulse Resp  10/30/16 1529 123/77 98.2 F (36.8 C) Oral 100 18   Constitutional: Well-developed, well-nourished female in no acute distress.  Cardiovascular: normal rate and rhythm Respiratory: normal effort, clear to auscultation bilaterally GI: Abd soft, tender especially in RUQ, gravid appropriate for gestational age.   No rebound or guarding. MS: Extremities nontender, no edema, normal ROM Neurologic: Alert and oriented x 4.  GU: Neg CVAT.  PELVIC EXAM: Cervix 1cm/long, no blood on glove.  FHT:  Baseline 140 , moderate variability, accelerations present, no decelerations Contractions: Irregular     Labs: No results found for this or any previous visit (from the past 24 hour(s)). --/--/O POS, O POS (12/18 1458)  Imaging:  No results found.  MAU Course/MDM:  Assessment: SIUP at 1011w2d Fall yesterday Back pain Abdominal pain   Plan: Report given to oncoming shift  Wynelle BourgeoisMarie Williams CNM, MSN Certified Nurse-Midwife 10/30/2016 3:54 PM   E. Omer JackMUMAW, DO 4:02 PM - Resumed care of patient from Wynelle BourgeoisMarie Williams CNM, will observe and monitor for contractions. Patient given flexeril for pain. Cervical exam by Wynelle BourgeoisMarie Williams was 1cm/long (unchanged from previously).   4:47 PM - Reassessed patient, her back pain is feeling better after flexeril. She is still very tender in RUQ (BPs normal). Will perform US, no signs of contractions. NST is reactive.   6:35 PM - Reassessed patient, reviewed US (prelim report). Reviewed NST. OK for discharge with strict precautions given regarding bleeding, abdominal pain, fetal movement, and LOF. Patient verbalized understanding.   ASSESSMENT/PLAN:  1. Back pain affecting pregnancy in third trimester   2. Abdominal pain affecting pregnancy   3. [redacted] weeks gestation of pregnancy   4. Previous cesarean delivery, antepartum   5. Traumatic injury during pregnancy in third trimester   6. Abdominal pain, right  upper quadrant     Allergies as of 10/30/2016   No Known Allergies     Medication List    TAKE these medications   acetaminophen 500 MG tablet Commonly known as:  TYLENOL Take 1,000 mg by mouth every 6 (six) hours as needed for moderate pain or headache.   cyclobenzaprine 5 MG tablet Commonly known as:  FLEXERIL Take 1 tablet (5 mg total) by mouth 3 (three) times daily as needed (back pain).   PRENATAL VITAMINS PO Take 1 tablet by mouth every morning.   psyllium 28 % packet Commonly known as:  METAMUCIL SMOOTH TEXTURE Take 1 packet by mouth 2 (two) times daily.

## 2016-10-30 NOTE — MAU Note (Signed)
Pt presents to MAU by EMS with back and abdominal pain after falling out of a chair yesterday around 1500. Pt reports the chair broke and she fell on her butt and back. She has not taken anything for pain.  Also reports that she thinks her stomach pain is due to eating greasy foods. Reports good fetal movement. Denies bleeding or leaking of fluid. States, part of her mucous plug came out last night.

## 2016-10-30 NOTE — Discharge Instructions (Signed)
Abdominal Pain During Pregnancy Belly (abdominal) pain is common during pregnancy. Most of the time, it is not a serious problem. Other times, it can be a sign that something is wrong with the pregnancy. Always tell your doctor if you have belly pain. Follow these instructions at home: Monitor your belly pain for any changes. The following actions may help you feel better:  Do not have sex (intercourse) or put anything in your vagina until you feel better.  Rest until your pain stops.  Drink clear fluids if you feel sick to your stomach (nauseous). Do not eat solid food until you feel better.  Only take medicine as told by your doctor.  Keep all doctor visits as told. Get help right away if:  You are bleeding, leaking fluid, or pieces of tissue come out of your vagina.  You have more pain or cramping.  You keep throwing up (vomiting).  You have pain when you pee (urinate) or have blood in your pee.  You have a fever.  You do not feel your baby moving as much.  You feel very weak or feel like passing out.  You have trouble breathing, with or without belly pain.  You have a very bad headache and belly pain.  You have fluid leaking from your vagina and belly pain.  You keep having watery poop (diarrhea).  Your belly pain does not go away after resting, or the pain gets worse. This information is not intended to replace advice given to you by your health care provider. Make sure you discuss any questions you have with your health care provider. Document Released: 09/30/2009 Document Revised: 05/20/2016 Document Reviewed: 05/11/2013 Elsevier Interactive Patient Education  2017 [employer]   What Do I Need to Know About Injuries During Pregnancy? Injuries can happen during pregnancy. Minor falls and accidents usually do not harm you or your baby. However, any injury should be reported to your doctor. What can I do to protect myself from injuries?  Remove rugs and loose  objects on the floor.  Wear comfortable shoes that have a good grip. Do not wear high-heeled shoes.  Always wear your seat belt. The lap belt should be below your belly. Always practice safe driving.  Do not ride on a motorcycle.  Do not participate in high-impact activities or sports.  Avoid:  Walking on wet or slippery floors.  Fires.  Starting fires.  Lifting heavy pots of boiling or hot liquids.  Fixing electrical problems.  Only take medicine as told by your doctor.  Know your blood type and the blood type of the baby's father.  Call your local emergency services (911 in the [place] if you are a victim of domestic violence or assault. For help and support, contact the [employer] Domestic Violence Hotline. Get help right away if:  You fall on your belly or have any high-impact accident or injury.  You have been a victim of domestic violence or any kind of violence.  You have been in a car accident.  You have bleeding from your vagina.  Fluid is leaking from your vagina.  You start to have belly cramping (contractions) or pain.  You feel weak or pass out (faint).  You start to throw up (vomit) after an injury.  You have been burned.  You have a stiff neck or neck pain.  You get a headache or have vision problems after an injury.  You do not feel the baby move or the baby is not moving  as much as normal. This information is not intended to replace advice given to you by your health care provider. Make sure you discuss any questions you have with your health care provider. Document Released: 11/14/2010 Document Revised: 03/19/2016 Document Reviewed: 07/19/2013 Elsevier Interactive Patient Education  2017 [employer].

## 2016-11-04 ENCOUNTER — Ambulatory Visit (HOSPITAL_COMMUNITY)
Admission: RE | Admit: 2016-11-04 | Discharge: 2016-11-04 | Disposition: A | Discharge status: Home / Self Care | Payer: Medicaid Other | Source: Ambulatory Visit | Attending: Obstetrics & Gynecology | Admitting: Obstetrics & Gynecology

## 2016-11-04 ENCOUNTER — Other Ambulatory Visit: Payer: Self-pay | Admitting: Obstetrics & Gynecology

## 2016-11-04 DIAGNOSIS — Z3A36 36 weeks gestation of pregnancy: Secondary | ICD-10-CM

## 2016-11-04 DIAGNOSIS — O34211 Maternal care for low transverse scar from previous cesarean delivery: Secondary | ICD-10-CM | POA: Insufficient documentation

## 2016-11-04 DIAGNOSIS — Z8759 Personal history of other complications of pregnancy, childbirth and the puerperium: Secondary | ICD-10-CM

## 2016-11-04 DIAGNOSIS — O99213 Obesity complicating pregnancy, third trimester: Secondary | ICD-10-CM | POA: Diagnosis not present

## 2016-11-04 DIAGNOSIS — O09893 Supervision of other high risk pregnancies, third trimester: Secondary | ICD-10-CM | POA: Diagnosis not present

## 2016-11-04 DIAGNOSIS — O09293 Supervision of pregnancy with other poor reproductive or obstetric history, third trimester: Secondary | ICD-10-CM | POA: Insufficient documentation

## 2016-11-05 ENCOUNTER — Ambulatory Visit (INDEPENDENT_AMBULATORY_CARE_PROVIDER_SITE_OTHER): Payer: Self-pay | Admitting: Student

## 2016-11-05 VITALS — BP 119/49 | HR 85 | Wt 258.3 lb

## 2016-11-05 DIAGNOSIS — Z98891 History of uterine scar from previous surgery: Secondary | ICD-10-CM

## 2016-11-05 DIAGNOSIS — Z8759 Personal history of other complications of pregnancy, childbirth and the puerperium: Secondary | ICD-10-CM

## 2016-11-05 DIAGNOSIS — O0993 Supervision of high risk pregnancy, unspecified, third trimester: Secondary | ICD-10-CM

## 2016-11-05 NOTE — Progress Notes (Signed)
   PRENATAL VISIT NOTE  Subjective:  Carla Carson is a 31 y.o. G4P3003 at 353w1d being seen today for ongoing prenatal care.  She is currently monitored for the following issues for this high risk pregnancy and has History of pre-eclampsia; Pregnancy, supervision, high-risk; History of syphilis; History of cesarean delivery; Bipolar disease during pregnancy (HCC); BMI 45.0-49.9, adult (HCC); Obesity in pregnancy; GBS (group B Streptococcus carrier), +RV culture, currently pregnant; History of prior pregnancy with SGA newborn; Mental disorder; and Preterm labor in third trimester on her problem list.  Patient reports contractions since the past few days.  Contractions: Irregular. Vag. Bleeding: None.  Movement: Present. Denies leaking of fluid.   The following portions of the patient's history were reviewed and updated as appropriate: allergies, current medications, past family history, past medical history, past social history, past surgical history and problem list. Problem list updated.  Objective:   Vitals:   11/05/16 1130  BP: (!) 119/49  Pulse: 85  Weight: 117.2 kg (258 lb 4.8 oz)    Fetal Status: Fetal Heart Rate (bpm): 158 Fundal Height: 37 cm Movement: Present  Presentation: Vertex  General:  Alert, oriented and cooperative. Patient is in no acute distress.  Skin: Skin is warm and dry. No rash noted.   Cardiovascular: Normal heart rate noted  Respiratory: Normal respiratory effort, no problems with respiration noted  Abdomen: Soft, gravid, appropriate for gestational age. Pain/Pressure: Present     Pelvic:  Cervical exam performed Dilation: 1.5 Effacement (%): 50 Station: -31.5/thin/-3  Extremities: Normal range of motion.  Edema: Trace  Mental Status: Normal mood and affect. Normal behavior. Normal judgment and thought content.   Assessment and Plan:  Pregnancy: G4P3003 at 323w1d  1. Supervision of high risk pregnancy in third trimester Feeling well; some contractions.  Requested that I strip her membranes but I declined based on GA.  - RPR.  - US MFM OB FOLLOW UP; Future  2. History of pre-eclampsia Denies signs and symptoms of pre-eclampsia. No headaches, blurry vision, epigastric pain.  BP is normal today.  - US MFM OB FOLLOW UP; Future  3. History of cesarean delivery Patient still wants to TOLAC - US MFM OB FOLLOW UP; Future  4. History of prior pregnancy with SGA newborn  - US MFM OB FOLLOW UP; Future  Term labor symptoms and general obstetric precautions including but not limited to vaginal bleeding, contractions, leaking of fluid and fetal movement were reviewed in detail with the patient. Please refer to After Visit Summary for other counseling recommendations.  Return in about 1 week (around 11/12/2016).   Marylene LandKathryn Lorraine Iyahna Obriant, CNM

## 2016-11-05 NOTE — Progress Notes (Signed)
States thinks she lost her mucous plug last week.

## 2016-11-06 LAB — RPR

## 2016-11-11 ENCOUNTER — Encounter: Payer: Self-pay | Admitting: Family Medicine

## 2016-11-11 ENCOUNTER — Encounter (HOSPITAL_COMMUNITY): Payer: Self-pay | Admitting: Neurology

## 2016-11-11 ENCOUNTER — Observation Stay (HOSPITAL_COMMUNITY)
Admission: EM | Admit: 2016-11-11 | Discharge: 2016-11-12 | Disposition: A | Discharge status: Home / Self Care | Payer: Medicaid Other | Attending: Family Medicine | Admitting: Family Medicine

## 2016-11-11 DIAGNOSIS — Y998 Other external cause status: Secondary | ICD-10-CM | POA: Diagnosis not present

## 2016-11-11 DIAGNOSIS — O9A213 Injury, poisoning and certain other consequences of external causes complicating pregnancy, third trimester: Principal | ICD-10-CM | POA: Insufficient documentation

## 2016-11-11 DIAGNOSIS — Y92480 Sidewalk as the place of occurrence of the external cause: Secondary | ICD-10-CM | POA: Diagnosis not present

## 2016-11-11 DIAGNOSIS — Z3493 Encounter for supervision of normal pregnancy, unspecified, third trimester: Secondary | ICD-10-CM

## 2016-11-11 DIAGNOSIS — O368130 Decreased fetal movements, third trimester, not applicable or unspecified: Secondary | ICD-10-CM | POA: Diagnosis not present

## 2016-11-11 DIAGNOSIS — Y9389 Activity, other specified: Secondary | ICD-10-CM | POA: Insufficient documentation

## 2016-11-11 DIAGNOSIS — Z3A37 37 weeks gestation of pregnancy: Secondary | ICD-10-CM | POA: Diagnosis not present

## 2016-11-11 DIAGNOSIS — F17200 Nicotine dependence, unspecified, uncomplicated: Secondary | ICD-10-CM | POA: Diagnosis not present

## 2016-11-11 DIAGNOSIS — W000XXA Fall on same level due to ice and snow, initial encounter: Secondary | ICD-10-CM | POA: Insufficient documentation

## 2016-11-11 DIAGNOSIS — S3991XA Unspecified injury of abdomen, initial encounter: Secondary | ICD-10-CM | POA: Insufficient documentation

## 2016-11-11 DIAGNOSIS — O99333 Smoking (tobacco) complicating pregnancy, third trimester: Secondary | ICD-10-CM | POA: Diagnosis not present

## 2016-11-11 HISTORY — DX: Unspecified pre-eclampsia, unspecified trimester: O14.90

## 2016-11-11 LAB — I-STAT CHEM 8, ED
BUN: <3 mg/dL — ABNORMAL LOW (ref 6–20)
CREATININE: 0.6 mg/dL (ref 0.44–1.00)
Calcium, Ion: 1.15 mmol/L (ref 1.15–1.40)
Chloride: 108 mmol/L (ref 101–111)
Glucose, Bld: 86 mg/dL (ref 65–99)
HEMATOCRIT: 33 % — AB (ref 36.0–46.0)
HEMOGLOBIN: 11.2 g/dL — AB (ref 12.0–15.0)
POTASSIUM: 3.4 mmol/L — AB (ref 3.5–5.1)
SODIUM: 140 mmol/L (ref 135–145)
TCO2: 24 mmol/L (ref 0–100)

## 2016-11-11 LAB — TYPE AND SCREEN
ABO/RH(D): O POS
ANTIBODY SCREEN: NEGATIVE

## 2016-11-11 LAB — CBC
HCT: 34.8 % — ABNORMAL LOW (ref 36.0–46.0)
Hemoglobin: 11.2 g/dL — ABNORMAL LOW (ref 12.0–15.0)
MCH: 28.2 pg (ref 26.0–34.0)
MCHC: 32.2 g/dL (ref 30.0–36.0)
MCV: 87.7 fL (ref 78.0–100.0)
PLATELETS: 344 10*3/uL (ref 150–400)
RBC: 3.97 MIL/uL (ref 3.87–5.11)
RDW: 15 % (ref 11.5–15.5)
WBC: 15 10*3/uL — AB (ref 4.0–10.5)

## 2016-11-11 LAB — ABO/RH: ABO/RH(D): O POS

## 2016-11-11 MED ORDER — ZOLPIDEM TARTRATE 5 MG PO TABS
5.0000 mg | ORAL_TABLET | Freq: Every evening | ORAL | Status: DC | PRN
  Administered 2016-11-11: 5 mg via ORAL
  Filled 2016-11-11: qty 1

## 2016-11-11 MED ORDER — NALBUPHINE HCL 10 MG/ML IJ SOLN
10.0000 mg | Freq: Once | INTRAMUSCULAR | Status: AC
  Administered 2016-11-11: 10 mg via INTRAVENOUS
  Filled 2016-11-11 (×2): qty 1

## 2016-11-11 MED ORDER — PRENATAL MULTIVITAMIN CH: 1.0000 | ORAL_TABLET | Freq: Every day | ORAL | Status: DC

## 2016-11-11 MED ORDER — ACETAMINOPHEN 325 MG PO TABS
650.0000 mg | ORAL_TABLET | ORAL | Status: DC | PRN
  Administered 2016-11-11 (×2): 650 mg via ORAL
  Filled 2016-11-11 (×2): qty 2

## 2016-11-11 MED ORDER — DOCUSATE SODIUM 100 MG PO CAPS
100.0000 mg | ORAL_CAPSULE | Freq: Every day | ORAL | Status: DC
  Administered 2016-11-11: 100 mg via ORAL
  Filled 2016-11-11: qty 1

## 2016-11-11 MED ORDER — CALCIUM CARBONATE ANTACID 500 MG PO CHEW: 2.0000 | CHEWABLE_TABLET | ORAL | Status: DC | PRN

## 2016-11-11 NOTE — ED Notes (Signed)
OB RR RN at bedside hooking patient up to monitor.

## 2016-11-11 NOTE — H&P (Signed)
ANTEPARTUM ADMISSION HISTORY AND PHYSICAL NOTE   History of Present Illness: Carla Carson is a 31 y.o. [phone] at 2434w0d admitted for observation following a fall at home, where she reports falling directly on her abdomen. She reports that since the fall she has continued to have vaginal pain. She reports the pain as feeling crampy and in her lower abdomen, with pressure. She says that this pain is different from the pain she experienced previously with contractions during other pregnancies.  Patient reports the fetal movement as active. Patient reports uterine contraction  activity as unsure, she doesn't think this pain is contractions, but is having abdominal contractions. Patient reports  vaginal bleeding as none. Patient describes fluid per vagina as None. Fetal presentation is cephalic.  Patient Active Problem List   Diagnosis Date Noted  . Abdominal trauma 11/11/2016    Past Medical History:  Diagnosis Date  . Preeclampsia     History reviewed. No pertinent surgical history.  OB History  Gravida Para Term Preterm AB Living  4 3 2 1   3   SAB TAB Ectopic Multiple Live Births          3    # Outcome Date GA Lbr Len/2nd Weight Sex Delivery Anes PTL Lv  4 Current           3 Preterm           2 Term           1 Term               Social History   Social History  . Marital status: Single    Spouse name: N/A  . Number of children: N/A  . Years of education: N/A   Social History Main Topics  . Smoking status: Current Every Day Smoker  . Smokeless tobacco: Never Used  . Alcohol use No  . Drug use: Unknown  . Sexual activity: Not Asked   Other Topics Concern  . None   Social History Narrative  . None    No family history on file.  No Known Allergies  No prescriptions prior to admission.    Review of Systems - Negative except noted in HPI  Vitals:  BP 121/81 (BP Location: Left Arm)   Pulse 72   Temp 98.2 F (36.8 C)   Resp 18   Ht 5\' 3"  (1.6 m)    Wt 258 lb (117 kg)   SpO2 100%   BMI 45.70 kg/m  Physical Examination: CONSTITUTIONAL: Well-developed, well-nourished female in no acute distress.  HENT:  Normocephalic, atraumatic, External right and left ear normal. Oropharynx is clear and moist EYES: Conjunctivae and EOM are normal. Pupils are equal, round, and reactive to light. No scleral icterus.  NECK: Normal range of motion, supple, no masses SKIN: Skin is warm and dry. No rash noted. Not diaphoretic. No erythema. No pallor. NEUROLGIC: Alert and oriented to person, place, and time. Normal reflexes, muscle tone coordination. No cranial nerve deficit noted. PSYCHIATRIC: Normal mood and affect. Normal behavior. Normal judgment and thought content. CARDIOVASCULAR: Normal heart rate noted, regular rhythm RESPIRATORY: Effort and breath sounds normal, no problems with respiration noted ABDOMEN: Soft, nontender, nondistended, gravid. MUSCULOSKELETAL: Normal range of motion. No edema and no tenderness. 2+ distal pulses.  Cervix: Evaluated by digital exam. and found to be 1 cm/ Long/-3 and fetal presentation is cephalic. Membranes:intact Fetal Monitoring:Baseline: 140s bpm, +accels, - decels, mod variability Tocometer: Flat  Labs:  Results for orders placed or  performed during the hospital encounter of 11/11/16 (from the past 24 hour(s))  CBC   Collection Time: 11/11/16  1:13 PM  Result Value Ref Range   WBC 15.0 (H) 4.0 - 10.5 K/uL   RBC 3.97 3.87 - 5.11 MIL/uL   Hemoglobin 11.2 (L) 12.0 - 15.0 g/dL   HCT 95.6 (L) 21.3 - 08.6 %   MCV 87.7 78.0 - 100.0 fL   MCH 28.2 26.0 - 34.0 pg   MCHC 32.2 30.0 - 36.0 g/dL   RDW 57.8 46.9 - 62.9 %   Platelets 344 150 - 400 K/uL  I-stat chem 8, ed   Collection Time: 11/11/16  2:05 PM  Result Value Ref Range   Sodium 140 135 - 145 mmol/L   Potassium 3.4 (L) 3.5 - 5.1 mmol/L   Chloride 108 101 - 111 mmol/L   BUN <3 (L) 6 - 20 mg/dL   Creatinine, Ser 5.28 0.44 - 1.00 mg/dL   Glucose, Bld 86  65 - 99 mg/dL   Calcium, Ion 4.13 2.44 - 1.40 mmol/L   TCO2 24 0 - 100 mmol/L   Hemoglobin 11.2 (L) 12.0 - 15.0 g/dL   HCT 01.0 (L) 27.2 - 53.6 %    Imaging Studies: No results found.   Assessment and Plan: Patient Active Problem List   Diagnosis Date Noted  . Abdominal trauma 11/11/2016   Admit to Antenatal Perform KB  Perform Type and Screen Continuous fetal monitoring x 24hrs due to trauma Routine antenatal care  Gorden Harms, MD  OB FELLOW HISTORY AND PHYSICAL ATTESTATION  I have seen and examined this patient; I agree with above documentation in the resident's note.    [name] 11/11/2016, 4:00 PM

## 2016-11-11 NOTE — ED Triage Notes (Addendum)
Per ems- Is [redacted] weeks pregnant G4P3, due 2/7. Pt slipped and fell outside today at 12 pm, landed on her abdomen. Reports no fetal movement since the fall. Is a x 4, no LOC. Initial BP 142/82, HR 100 , 99% RA. Lower abdominal pain that is new.

## 2016-11-11 NOTE — ED Notes (Signed)
[facility]

## 2016-11-11 NOTE — ED Provider Notes (Addendum)
[facility]-EMER[facility]ENCY DEPT Provider Note   CSN: [id number] Arrival date & time: 11/11/16  1308     History   Chief Complaint Chief Complaint  Patient presents with  . Decreased Fetal Movement    HPI Carla Carson is a 31 y.o. female.  HPI  Pt is a G5P4 who comes in to the ER after a fall. Pt reports that at noon she fell onto her stomach. Pt was outside and slipped over the snow. Pt is having suprapubic abd pain. Denies vaginal bleeding or discharge. Pt is not feeling her baby move.   Past Medical History:  Diagnosis Date  . Preeclampsia     There are no active problems to display for this patient.   History reviewed. No pertinent surgical history.  OB History    Gravida Para Term Preterm AB Living   4 3 2 1   3    SAB TAB Ectopic Multiple Live Births           3       Home Medications    Prior to Admission medications   Not on File    Family History No family history on file.  Social History Social History  Substance Use Topics  . Smoking status: Current Every Day Smoker  . Smokeless tobacco: Never Used  . Alcohol use No     Allergies   Patient has no known allergies.   Review of Systems Review of Systems  ROS 10 Systems reviewed and are negative for acute change except as noted in the HPI.     Physical Exam Updated Vital Signs BP 107/74   Pulse 84   Temp 99 F (37.2 C) (Oral)   Resp 18   Ht 5\' 3"  (1.6 m)   Wt 258 lb (117 kg)   SpO2 100%   BMI 45.70 kg/m   Physical Exam  Constitutional: She is oriented to person, place, and time. She appears well-developed.  HENT:  Head: Normocephalic and atraumatic.  Eyes: EOM are normal.  Neck: Normal range of motion. Neck supple.  Cardiovascular: Normal rate.   Pulmonary/Chest: Effort normal.  Abdominal: Bowel sounds are normal.  Neurological: She is alert and oriented to person, place, and time.  Skin: Skin is warm and dry.  Nursing note and vitals reviewed.    ED Treatments / Results   Labs (all labs ordered are listed, but only abnormal results are displayed) Labs Reviewed  CBC - Abnormal; Notable for the following:       Result Value   WBC 15.0 (*)    Hemoglobin 11.2 (*)    HCT 34.8 (*)    All other components within normal limits  I-STAT CHEM 8, ED - Abnormal; Notable for the following:    Potassium 3.4 (*)    BUN <3 (*)    Hemoglobin 11.2 (*)    HCT 33.0 (*)    All other components within normal limits    EKG  EKG Interpretation None       Radiology No results found.  Procedures Procedures (including critical care time)  Medications Ordered in ED Medications - No data to display   Initial Impression / Assessment and Plan / ED Course  I have reviewed the triage vital signs and the nursing notes.  Pertinent labs & imaging results that were available during my care of the patient were reviewed by me and considered in my medical decision making (see chart for details).  Clinical Course as of Nov 12 1411  Wed Nov 11, 2016  1412 Pt has FHT, however, with her lower quadrant pain, and hx of csection,  OB recommends transfer to [facility]  [AN]    Clinical Course User Index [AN] Derwood Kaplan, MD   Pt with fall and abd pain. No vaginal bleeding. Constant suprapubic pain that is not similar to labor. [facility] consulted for fetal monitoring. Basic labs ordered.   Final Clinical Impressions(s) / ED Diagnoses   Final diagnoses:  Third trimester pregnancy  Traumatic injury during pregnancy in third trimester    New Prescriptions New Prescriptions   No medications on file     Derwood Kaplan, MD 11/11/16 1356    Derwood Kaplan, MD 11/11/16 1413

## 2016-11-11 NOTE — Progress Notes (Signed)
Notified MD of patient's arrival.

## 2016-11-11 NOTE — Progress Notes (Signed)
[facility] for a G4P3 37.0 weeks came in EMS pt fell directly on abdomen while outside, pt states she fell onto a sidewalk, and is having lower abdomen and vaginal pain, denies bleeding or leaking any fluid. Pt states she has had decreased fetal movement since fall, placed om fetal monitor. Called Dr Alvester MorinNewton called and made aware of pt status, to have pt transferred to [facility] for further monitoring.  ED Dr made aware.

## 2016-11-11 NOTE — ED Notes (Signed)
Pt has [facility] # [facility].

## 2016-11-11 NOTE — Progress Notes (Signed)
Patient asked to please stay in fowlers or semi fowler position for integrity of EFM.

## 2016-11-11 NOTE — Progress Notes (Signed)
Orthopedic Tech Progress Note Patient Details:  Carla Carson 03/03/1986 [id number]  Patient ID: Carla Carson, female   DOB: 07/05/1986, 31 y.o.   MRN: [id number]   Nikki DomCrawford, Majestic Brister 11/11/2016, 1:15 PM Made level 2 trauma visit

## 2016-11-11 NOTE — ED Notes (Signed)
[facility] RN [facility] accepting MD is Dr. Alvester MorinNewton.

## 2016-11-11 NOTE — ED Notes (Signed)
OB RN notified at 1308.

## 2016-11-12 ENCOUNTER — Encounter (HOSPITAL_COMMUNITY): Payer: Self-pay

## 2016-11-12 LAB — KLEIHAUER-BETKE STAIN
# Vials RhIg: 1
Fetal Cells %: 0 %
Quantitation Fetal Hemoglobin: 0 mL

## 2016-11-12 MED ORDER — DIPHENHYDRAMINE HCL 25 MG PO CAPS
25.0000 mg | ORAL_CAPSULE | Freq: Four times a day (QID) | ORAL | Status: DC | PRN
  Administered 2016-11-12: 25 mg via ORAL
  Filled 2016-11-12: qty 1

## 2016-11-12 NOTE — Progress Notes (Signed)
Pt discharged with written instructions. Pt verbalized an understanding. No concerns noted. Quinetta Shilling L Sondos Wolfman, RN 

## 2016-11-12 NOTE — Discharge Summary (Signed)
Obstetric Discharge Summary Reason for Admission: Fall Prenatal Procedures: None Intrapartum Procedures: Fetal Monitoring Postpartum Procedures: NA Complications-Operative and Postpartum: NA  31 yo G4P2103 IUP 37 weeks who was admitted for observation following a fall. She was evaluated in the ER and determined to have no acute injury. She was observed and monitored on HROB unit without evidence of fetal compromise. She did have some irregular uterine contractions which responded to measures. No labor was noted. She reported good fetal movement. Ambulating and voiding with out problems. Tolerating diet. Felt amendable for d/c home and to continue with routine OB care.    Hemoglobin  Date Value Ref Range Status  11/11/2016 11.2 (L) 12.0 - 15.0 g/dL Final   HCT  Date Value Ref Range Status  11/11/2016 33.0 (L) 36.0 - 46.0 % Final    Physical Exam:  AF VSS Lungs clear Heart RRR Abd soft + BS gravid non tender Ext non tender  Discharge Diagnoses: Fall  Discharge Information: Date: 11/12/2016 Activity: unrestricted Diet: routine Medications: PNV Condition: stable Instructions: refer to practice specific booklet Discharge to: home   Follow-up Information    [facility]. Schedule an appointment as soon as possible for a visit.   Contact information: [address] [place] [id number] [phone]            Carla StaggersMichael L Ameria Sanjurjo 11/12/2016, 7:03 AM

## 2016-11-18 ENCOUNTER — Ambulatory Visit (INDEPENDENT_AMBULATORY_CARE_PROVIDER_SITE_OTHER): Payer: Self-pay | Admitting: Obstetrics & Gynecology

## 2016-11-18 VITALS — BP 121/72 | HR 87 | Wt 264.1 lb

## 2016-11-18 DIAGNOSIS — O34219 Maternal care for unspecified type scar from previous cesarean delivery: Secondary | ICD-10-CM

## 2016-11-18 DIAGNOSIS — O0993 Supervision of high risk pregnancy, unspecified, third trimester: Secondary | ICD-10-CM

## 2016-11-18 DIAGNOSIS — Z98891 History of uterine scar from previous surgery: Secondary | ICD-10-CM

## 2016-11-18 NOTE — Progress Notes (Signed)
C/o contractions today since about 10:00 - about 5-7 minutes.

## 2016-11-18 NOTE — Patient Instructions (Addendum)
Return to clinic for any scheduled appointments or obstetric concerns, or go to [facility]U for evaluation  

## 2016-11-18 NOTE — Progress Notes (Signed)
   PRENATAL VISIT NOTE  Subjective:  Carla Carson is a 31 y.o. [phone] at 8952w0d being seen today for ongoing prenatal care.  She is currently monitored for the following issues for this high-risk pregnancy and has History of pre-eclampsia; Pregnancy, supervision, high-risk; History of syphilis; History of cesarean delivery; Bipolar disease during pregnancy (HCC); BMI 45.0-49.9, adult (HCC); Obesity in pregnancy; GBS (group B Streptococcus carrier), +RV culture, currently pregnant; History of prior pregnancy with SGA newborn; Mental disorder; and Abdominal trauma on her problem list.  Patient reports occasional contractions.  Contractions: Irregular. Vag. Bleeding: None.  Movement: Present. Denies leaking of fluid.   The following portions of the patient's history were reviewed and updated as appropriate: allergies, current medications, past family history, past medical history, past social history, past surgical history and problem list. Problem list updated.  Objective:   Vitals:   11/18/16 1353  BP: 121/72  Pulse: 87  Weight: 264 lb 1.6 oz (119.8 kg)    Fetal Status: Fetal Heart Rate (bpm): 140 Fundal Height: 38 cm Movement: Present  Presentation: Vertex  General:  Alert, oriented and cooperative. Patient is in no acute distress.  Skin: Skin is warm and dry. No rash noted.   Cardiovascular: Normal heart rate noted  Respiratory: Normal respiratory effort, no problems with respiration noted  Abdomen: Soft, gravid, appropriate for gestational age. Pain/Pressure: Present     Pelvic:  Cervical exam performed Dilation: 1.5 Effacement (%): 50 Station: -3  Extremities: Normal range of motion.  Edema: Trace  Mental Status: Normal mood and affect. Normal behavior. Normal judgment and thought content.   Assessment and Plan:  Pregnancy: [id number] at 5352w0d  1. History of cesarean delivery Planning for TOLAC  2. Supervision of high risk pregnancy in third trimester Term labor symptoms and  general obstetric precautions including but not limited to vaginal bleeding, contractions, leaking of fluid and fetal movement were reviewed in detail with the patient. Please refer to After Visit Summary for other counseling recommendations.  Return in about 1 week (around 11/25/2016) for OB Visit (HOB).   Tereso NewcomerUgonna A Annye Forrey, MD

## 2016-11-20 ENCOUNTER — Other Ambulatory Visit: Payer: Self-pay | Admitting: Student

## 2016-11-20 ENCOUNTER — Ambulatory Visit (HOSPITAL_COMMUNITY)
Admission: RE | Admit: 2016-11-20 | Discharge: 2016-11-20 | Disposition: A | Discharge status: Home / Self Care | Payer: Medicaid Other | Source: Ambulatory Visit | Attending: Student | Admitting: Student

## 2016-11-20 DIAGNOSIS — Z3A38 38 weeks gestation of pregnancy: Secondary | ICD-10-CM

## 2016-11-20 DIAGNOSIS — O0993 Supervision of high risk pregnancy, unspecified, third trimester: Secondary | ICD-10-CM

## 2016-11-20 DIAGNOSIS — Z98891 History of uterine scar from previous surgery: Secondary | ICD-10-CM | POA: Insufficient documentation

## 2016-11-20 DIAGNOSIS — Z8759 Personal history of other complications of pregnancy, childbirth and the puerperium: Secondary | ICD-10-CM

## 2016-11-25 ENCOUNTER — Encounter (HOSPITAL_COMMUNITY): Payer: Self-pay

## 2016-11-25 ENCOUNTER — Inpatient Hospital Stay (HOSPITAL_COMMUNITY)
Admission: AD | Admit: 2016-11-25 | Discharge: 2016-11-25 | Disposition: A | Discharge status: Home / Self Care | Payer: Medicaid Other | Source: Ambulatory Visit | Attending: Family Medicine | Admitting: Family Medicine

## 2016-11-25 ENCOUNTER — Inpatient Hospital Stay (HOSPITAL_COMMUNITY)
Admission: AD | Admit: 2016-11-25 | Discharge: 2016-11-28 | DRG: 767 | Disposition: A | Discharge status: Home / Self Care | Payer: Medicaid Other | Source: Ambulatory Visit | Attending: Obstetrics and Gynecology | Admitting: Obstetrics and Gynecology

## 2016-11-25 ENCOUNTER — Inpatient Hospital Stay (HOSPITAL_COMMUNITY): Payer: Medicaid Other | Admitting: Anesthesiology

## 2016-11-25 DIAGNOSIS — O99824 Streptococcus B carrier state complicating childbirth: Secondary | ICD-10-CM | POA: Diagnosis present

## 2016-11-25 DIAGNOSIS — O34211 Maternal care for low transverse scar from previous cesarean delivery: Secondary | ICD-10-CM | POA: Diagnosis present

## 2016-11-25 DIAGNOSIS — Z3A37 37 weeks gestation of pregnancy: Secondary | ICD-10-CM | POA: Diagnosis not present

## 2016-11-25 DIAGNOSIS — R030 Elevated blood-pressure reading, without diagnosis of hypertension: Secondary | ICD-10-CM | POA: Diagnosis not present

## 2016-11-25 DIAGNOSIS — F319 Bipolar disorder, unspecified: Secondary | ICD-10-CM | POA: Diagnosis present

## 2016-11-25 DIAGNOSIS — O9089 Other complications of the puerperium, not elsewhere classified: Secondary | ICD-10-CM | POA: Diagnosis not present

## 2016-11-25 DIAGNOSIS — Z3493 Encounter for supervision of normal pregnancy, unspecified, third trimester: Secondary | ICD-10-CM | POA: Insufficient documentation

## 2016-11-25 DIAGNOSIS — Z3A39 39 weeks gestation of pregnancy: Secondary | ICD-10-CM

## 2016-11-25 DIAGNOSIS — O99344 Other mental disorders complicating childbirth: Secondary | ICD-10-CM | POA: Diagnosis present

## 2016-11-25 DIAGNOSIS — Z302 Encounter for sterilization: Secondary | ICD-10-CM | POA: Diagnosis not present

## 2016-11-25 DIAGNOSIS — O99214 Obesity complicating childbirth: Secondary | ICD-10-CM | POA: Diagnosis present

## 2016-11-25 DIAGNOSIS — Z6841 Body Mass Index (BMI) 40.0 and over, adult: Secondary | ICD-10-CM

## 2016-11-25 DIAGNOSIS — O99825 Streptococcus B carrier state complicating the puerperium: Secondary | ICD-10-CM | POA: Diagnosis present

## 2016-11-25 DIAGNOSIS — F17210 Nicotine dependence, cigarettes, uncomplicated: Secondary | ICD-10-CM | POA: Diagnosis present

## 2016-11-25 DIAGNOSIS — O99334 Smoking (tobacco) complicating childbirth: Secondary | ICD-10-CM | POA: Diagnosis present

## 2016-11-25 LAB — CBC
HEMATOCRIT: 34.6 % — AB (ref 36.0–46.0)
Hemoglobin: 11.5 g/dL — ABNORMAL LOW (ref 12.0–15.0)
MCH: 28.4 pg (ref 26.0–34.0)
MCHC: 33.2 g/dL (ref 30.0–36.0)
MCV: 85.4 fL (ref 78.0–100.0)
PLATELETS: 412 10*3/uL — AB (ref 150–400)
RBC: 4.05 MIL/uL (ref 3.87–5.11)
RDW: 15.5 % (ref 11.5–15.5)
WBC: 18 10*3/uL — ABNORMAL HIGH (ref 4.0–10.5)

## 2016-11-25 LAB — TYPE AND SCREEN
ABO/RH(D): O POS
Antibody Screen: NEGATIVE

## 2016-11-25 MED ORDER — LIDOCAINE HCL (PF) 1 % IJ SOLN
30.0000 mL | INTRAMUSCULAR | Status: DC | PRN
  Filled 2016-11-25: qty 30

## 2016-11-25 MED ORDER — LACTATED RINGERS IV SOLN: 500.0000 mL | Freq: Once | INTRAVENOUS | Status: DC

## 2016-11-25 MED ORDER — OXYCODONE-ACETAMINOPHEN 5-325 MG PO TABS: 2.0000 | ORAL_TABLET | ORAL | Status: DC | PRN

## 2016-11-25 MED ORDER — DIPHENHYDRAMINE HCL 50 MG/ML IJ SOLN: 12.5000 mg | INTRAMUSCULAR | Status: DC | PRN

## 2016-11-25 MED ORDER — LIDOCAINE HCL (PF) 1 % IJ SOLN
INTRAMUSCULAR | Status: DC | PRN
  Administered 2016-11-25: 4 mL via EPIDURAL

## 2016-11-25 MED ORDER — OXYTOCIN BOLUS FROM INFUSION
500.0000 mL | Freq: Once | INTRAVENOUS | Status: AC
  Administered 2016-11-26: 500 mL via INTRAVENOUS

## 2016-11-25 MED ORDER — ONDANSETRON HCL 4 MG/2ML IJ SOLN: 4.0000 mg | Freq: Four times a day (QID) | INTRAMUSCULAR | Status: DC | PRN

## 2016-11-25 MED ORDER — OXYTOCIN 40 UNITS IN LACTATED RINGERS INFUSION - SIMPLE MED
2.5000 [IU]/h | INTRAVENOUS | Status: DC
  Filled 2016-11-25: qty 1000

## 2016-11-25 MED ORDER — PHENYLEPHRINE 40 MCG/ML (10ML) SYRINGE FOR IV PUSH (FOR BLOOD PRESSURE SUPPORT): 80.0000 ug | PREFILLED_SYRINGE | INTRAVENOUS | Status: DC | PRN

## 2016-11-25 MED ORDER — EPHEDRINE 5 MG/ML INJ: 10.0000 mg | INTRAVENOUS | Status: DC | PRN

## 2016-11-25 MED ORDER — FENTANYL 2.5 MCG/ML BUPIVACAINE 1/10 % EPIDURAL INFUSION (WH - ANES)
14.0000 mL/h | INTRAMUSCULAR | Status: DC | PRN
  Administered 2016-11-25: 14 mL/h via EPIDURAL
  Filled 2016-11-25: qty 100

## 2016-11-25 MED ORDER — SOD CITRATE-CITRIC ACID 500-334 MG/5ML PO SOLN: 30.0000 mL | ORAL | Status: DC | PRN

## 2016-11-25 MED ORDER — SODIUM CHLORIDE 0.9 % IV SOLN
2.0000 g | Freq: Once | INTRAVENOUS | Status: AC
  Administered 2016-11-25: 2 g via INTRAVENOUS
  Filled 2016-11-25: qty 2000

## 2016-11-25 MED ORDER — LACTATED RINGERS IV SOLN: 500.0000 mL | INTRAVENOUS | Status: DC | PRN

## 2016-11-25 MED ORDER — LACTATED RINGERS IV SOLN
INTRAVENOUS | Status: DC
  Administered 2016-11-25: 23:00:00 via INTRAVENOUS

## 2016-11-25 MED ORDER — EPHEDRINE 5 MG/ML INJ
10.0000 mg | INTRAVENOUS | Status: DC | PRN
  Filled 2016-11-25: qty 4

## 2016-11-25 MED ORDER — FENTANYL CITRATE (PF) 100 MCG/2ML IJ SOLN
100.0000 ug | INTRAMUSCULAR | Status: DC | PRN
  Administered 2016-11-25: 100 ug via INTRAVENOUS
  Filled 2016-11-25: qty 2

## 2016-11-25 MED ORDER — OXYCODONE-ACETAMINOPHEN 5-325 MG PO TABS: 1.0000 | ORAL_TABLET | ORAL | Status: DC | PRN

## 2016-11-25 MED ORDER — ACETAMINOPHEN 325 MG PO TABS: 650.0000 mg | ORAL_TABLET | ORAL | Status: DC | PRN

## 2016-11-25 MED ORDER — PHENYLEPHRINE 40 MCG/ML (10ML) SYRINGE FOR IV PUSH (FOR BLOOD PRESSURE SUPPORT)
80.0000 ug | PREFILLED_SYRINGE | INTRAVENOUS | Status: DC | PRN
  Filled 2016-11-25: qty 5

## 2016-11-25 MED ORDER — PHENYLEPHRINE 40 MCG/ML (10ML) SYRINGE FOR IV PUSH (FOR BLOOD PRESSURE SUPPORT)
80.0000 ug | PREFILLED_SYRINGE | INTRAVENOUS | Status: DC | PRN
  Filled 2016-11-25: qty 5
  Filled 2016-11-25: qty 10

## 2016-11-25 NOTE — Anesthesia Preprocedure Evaluation (Signed)
Anesthesia Evaluation  Patient identified by MRN, date of birth, ID band Patient awake    Reviewed: Allergy & Precautions, Patient's Chart, lab work & pertinent test results  Airway Mallampati: III       Dental  (+) Teeth Intact   Pulmonary Current Smoker,    breath sounds clear to auscultation       Cardiovascular hypertension,  Rhythm:Regular Rate:Normal     Neuro/Psych PSYCHIATRIC DISORDERS Bipolar Disorder    GI/Hepatic negative GI ROS, Neg liver ROS,   Endo/Other  negative endocrine ROS  Renal/GU negative Renal ROS  negative genitourinary   Musculoskeletal negative musculoskeletal ROS (+)   Abdominal   Peds negative pediatric ROS (+)  Hematology negative hematology ROS (+)   Anesthesia Other Findings   Reproductive/Obstetrics (+) Pregnancy                             Lab Results  Component Value Date   WBC 18.0 (H) 11/25/2016   HGB 11.5 (L) 11/25/2016   HCT 34.6 (L) 11/25/2016   MCV 85.4 11/25/2016   PLT 412 (H) 11/25/2016     Anesthesia Physical Anesthesia Plan  ASA: III  Anesthesia Plan: Epidural   Post-op Pain Management:    Induction:   Airway Management Planned:   Additional Equipment:   Intra-op Plan:   Post-operative Plan:   Informed Consent: I have reviewed the patients History and Physical, chart, labs and discussed the procedure including the risks, benefits and alternatives for the proposed anesthesia with the patient or authorized representative who has indicated his/her understanding and acceptance.     Plan Discussed with:   Anesthesia Plan Comments:         Anesthesia Quick Evaluation

## 2016-11-25 NOTE — H&P (Signed)
LABOR AND DELIVERY ADMISSION HISTORY AND PHYSICAL NOTE  Carla Carson is a 31 y.o. female [phone] with IUP at [redacted]w[redacted]d by 21 week ultrasound presenting with spontaneous onset of labor. She has had an increase in the intensity and frequency of contractions.    She reports positive fetal movement. She denies leakage of fluid or vaginal bleeding.  Prenatal History/Complications: C-section with last pregnancy secondary to fetal intolerance Previously tolerated 2 vaginal deliveries Pre-eclampsia with previous pregnancy GBS positive  Past Medical History: Past Medical History:  Diagnosis Date  . Chlamydia   . Gonorrhea   . Infection    UTI  . Ovarian cyst   . Preeclampsia   . Syphilis     Past Surgical History: Past Surgical History:  Procedure Laterality Date  . CESAREAN SECTION N/A 11/07/2015   Procedure: CESAREAN SECTION;  Surgeon: Willodean Rosenthal, MD;  Location: [facility];  Service: Obstetrics;  Laterality: N/A;  . WISDOM TOOTH EXTRACTION      Obstetrical History: OB History    Gravida Para Term Preterm AB Living   5 4 3 1  0 3   SAB TAB Ectopic Multiple Live Births   0 0 0   3      Social History: Social History   Social History  . Marital status: Single    Spouse name: N/A  . Number of children: N/A  . Years of education: N/A   Social History Main Topics  . Smoking status: Current Every Day Smoker    Packs/day: 0.25  . Smokeless tobacco: Never Used  . Alcohol use No  . Drug use: Yes    Types: Marijuana     Comment: once in a blue moon  . Sexual activity: Yes    Birth control/ protection: None   Other Topics Concern  . None   Social History Narrative   ** Merged History Encounter **        Family History: Family History  Problem Relation Age of Onset  . Heart disease Mother     hole in heart  . Cancer Maternal Grandmother     Allergies: Allergies  Allergen Reactions  . Nubain [Nalbuphine Hcl] Itching    Prescriptions Prior to Admission   Medication Sig Dispense Refill Last Dose  . Prenatal Vit-Fe Fumarate-FA (PRENATAL MULTIVITAMIN) TABS tablet Take 1 tablet by mouth daily at 12 noon.   11/25/2016 at Unknown time  . acetaminophen (TYLENOL) 500 MG tablet Take 1,000 mg by mouth every 6 (six) hours as needed for moderate pain or headache.    Past Month at Unknown time     Review of Systems   All systems reviewed and negative except as stated in HPI  Blood pressure 132/90, pulse 99, temperature 99 F (37.2 C), temperature source Oral, resp. rate 18, height 5\' 3"  (1.6 m), weight 260 lb (117.9 kg), last menstrual period 01/31/2016, SpO2 99 %, unknown if currently breastfeeding. General appearance: alert, cooperative and no distress Lungs: clear to auscultation bilaterally Heart: regular rate and rhythm Abdomen: soft, non-tender; bowel sounds normal Extremities: No calf swelling or tenderness Presentation: cephalic Fetal monitoring: FHT with baseline 130s, +accels, occasional early decelerations, mod variability Uterine activity: ctx q2-3 min Dilation: 6.5 Effacement (%): 90 Station: -3, -2 Exam by:: Burnadette Peter RN/Danielle [employer]   Prenatal labs: ABO, Rh: --/--/O POS, O POS (01/17 1613) Antibody: NEG (01/17 1613) Rubella: immune RPR: NON REAC (01/11 1157)  HBsAg:   Negative HIV:   Negative GBS: Positive (12/19 1600)  1  hr Glucola: negative Genetic screening:  Quad negative Anatomy US: Incomplete spine. Subsequent U/S appear normal.   Prenatal Transfer Tool  Maternal Diabetes: No Genetic Screening: Normal Maternal Ultrasounds/Referrals: Normal Fetal Ultrasounds or other Referrals:  Other: Incomplete spine early on, but subsequently normal Maternal Substance Abuse:  Yes:  Type: Smoker Significant Maternal Medications:  None Significant Maternal Lab Results: Lab values include: Group B Strep positive  Results for orders placed or performed during the hospital encounter of 11/25/16 (from the past 24  hour(s))  CBC   Collection Time: 11/25/16 10:47 PM  Result Value Ref Range   WBC 18.0 (H) 4.0 - 10.5 K/uL   RBC 4.05 3.87 - 5.11 MIL/uL   Hemoglobin 11.5 (L) 12.0 - 15.0 g/dL   HCT 16.134.6 (L) 09.636.0 - 04.546.0 %   MCV 85.4 78.0 - 100.0 fL   MCH 28.4 26.0 - 34.0 pg   MCHC 33.2 30.0 - 36.0 g/dL   RDW 40.915.5 81.111.5 - 91.415.5 %   Platelets 412 (H) 150 - 400 K/uL    Patient Active Problem List   Diagnosis Date Noted  . Labor and delivery, indication for care 11/25/2016  . Abdominal trauma 11/11/2016  . Mental disorder 10/12/2016  . Pregnancy, supervision, high-risk 07/16/2016  . History of syphilis 07/16/2016  . History of cesarean delivery 07/16/2016  . Bipolar disease during pregnancy (HCC) 07/16/2016  . BMI 45.0-49.9, adult (HCC) 07/16/2016  . Obesity in pregnancy 07/16/2016  . GBS (group B Streptococcus carrier), +RV culture, currently pregnant 07/16/2016  . History of prior pregnancy with SGA newborn 07/16/2016  . History of pre-eclampsia 11/07/2015    Assessment: Carla Carson is a 31 y.o. [id number] at 7968w0d here for spontaneous onset of labor.   #Labor:TOLAC. Patient has signed consent. Expectant management.  #Pain: Desires epidural when appropriate.  #FWB: Cat I #ID:  GBS + Currently on ampicillin #MOF: Breast #MOC: BTL (consent signed 08/2016) #Circ:  No  Lise AuerMegan C Campbell, MD PGY-2 11/25/2016, 11:50 PM   CNM attestation:  I have seen and examined this patient; I agree with above documentation in the resident's note.   Carla Carson is a 31 y.o. [phone] here for SOL; now w/ epidural  PE: BP (!) 141/84   Pulse 95   Temp 97.9 F (36.6 C) (Oral)   Resp 18   Ht 5\' 3"  (1.6 m)   Wt 117.9 kg (260 lb)   LMP 01/31/2016   SpO2 100%   BMI 46.06 kg/m   Resp: normal effort, no distress Abd: gravid  ROS, labs, PMH reviewed  [facility]: [facility] Expectant management Amp for GBS ppx due to advanced labor/multip TOLAC Anticipate SVD Will add pre-e labs due to  borderline BPs  Michale Weikel CNM 11/26/2016, 2:01 AM

## 2016-11-25 NOTE — Discharge Instructions (Signed)
Braxton Hicks Contractions °Contractions of the uterus can occur throughout pregnancy. Contractions are not always a sign that you are in labor.  °WHAT ARE BRAXTON HICKS CONTRACTIONS?  °Contractions that occur before labor are called Braxton Hicks contractions, or false labor. Toward the end of pregnancy (32-34 weeks), these contractions can develop more often and may become more forceful. This is not true labor because these contractions do not result in opening (dilatation) and thinning of the cervix. They are sometimes difficult to tell apart from true labor because these contractions can be forceful and people have different pain tolerances. You should not feel embarrassed if you go to the hospital with false labor. Sometimes, the only way to tell if you are in true labor is for your health care provider to look for changes in the cervix. °If there are no prenatal problems or other health problems associated with the pregnancy, it is completely safe to be sent home with false labor and await the onset of true labor. °HOW CAN YOU TELL THE DIFFERENCE BETWEEN TRUE AND FALSE LABOR? °False Labor  °· The contractions of false labor are usually shorter and not as hard as those of true labor.   °· The contractions are usually irregular.   °· The contractions are often felt in the front of the lower abdomen and in the groin.   °· The contractions may go away when you walk around or change positions while lying down.   °· The contractions get weaker and are shorter lasting as time goes on.   °· The contractions do not usually become progressively stronger, regular, and closer together as with true labor.   °True Labor  °· Contractions in true labor last 30-70 seconds, become very regular, usually become more intense, and increase in frequency.   °· The contractions do not go away with walking.   °· The discomfort is usually felt in the top of the uterus and spreads to the lower abdomen and low back.   °· True labor can be  determined by your health care provider with an exam. This will show that the cervix is dilating and getting thinner.   °WHAT TO REMEMBER °· Keep up with your usual exercises and follow other instructions given by your health care provider.   °· Take medicines as directed by your health care provider.   °· Keep your regular prenatal appointments.   °· Eat and drink lightly if you think you are going into labor.   °· If Braxton Hicks contractions are making you uncomfortable:   °¨ Change your position from lying down or resting to walking, or from walking to resting.   °¨ Sit and rest in a tub of warm water.   °¨ Drink 2-3 glasses of water. Dehydration may cause these contractions.   °¨ Do slow and deep breathing several times an hour.   °WHEN SHOULD I SEEK IMMEDIATE MEDICAL CARE? °Seek immediate medical care if: °· Your contractions become stronger, more regular, and closer together.   °· You have fluid leaking or gushing from your vagina.   °· You have a fever.     °· You have vaginal bleeding.   °· You have continuous abdominal pain.   °· You have low back pain that you never had before.   °· You feel your baby's head pushing down and causing pelvic pressure.   °· Your baby is not moving as much as it used to.   °This information is not intended to replace advice given to you by your health care provider. Make sure you discuss any questions you have with your health care provider. °Document Released: 10/12/2005 Document   Revised: 02/03/2016 Document Reviewed: 07/24/2013 °Elsevier Interactive Patient Education © 2017 [employer] ° °

## 2016-11-25 NOTE — MAU Note (Signed)
Pt states she was here earlier today for contractions. Was 4.5cm. Reports contractions every 3-5 mins. Denies LOF or vag bleeding. + FM.

## 2016-11-25 NOTE — H&P (Signed)
LABOR AND DELIVERY ADMISSION HISTORY AND PHYSICAL NOTE  Carla Carson is a 31 y.o. female [phone] with IUP at [redacted]w[redacted]d with hx c-section and preeclampsia during previous pregnancy, gonorrhea, chlamydia and syphilis  presenting for contractions of increasing frequency and planned TOLAC.  Pt began feeling contractions at 4 am today, initially coming at 5-6 minute intervals, but now feeling painful contractions every 2-3 minutes.  Pt was seen earlier this morning and was found to be 4.5 cm and discharged home.  She reports positive fetal movement. She denies leakage of fluid or vaginal bleeding.  Pt has been counseled by her OB provider about TOLAC and its risks.  Prenatal History/Complications: GBS positive, smoking 4 cigarettes daily.  Hx of preeclampsia and C-section 2/2 NRFHT during her previous pregnancy.  Hx of gonorrhea, chlamydia and syphilis.  Past Medical History: Past Medical History:  Diagnosis Date  . Chlamydia   . Gonorrhea   . Infection    UTI  . Ovarian cyst   . Preeclampsia   . Syphilis     Past Surgical History: Past Surgical History:  Procedure Laterality Date  . CESAREAN SECTION N/A 11/07/2015   Procedure: CESAREAN SECTION;  Surgeon: Willodean Rosenthal, MD;  Location: [facility];  Service: Obstetrics;  Laterality: N/A;  . WISDOM TOOTH EXTRACTION      Obstetrical History: OB History    Gravida Para Term Preterm AB Living   5 4 3 1  0 3   SAB TAB Ectopic Multiple Live Births   0 0 0   3      Social History: Social History   Social History  . Marital status: Single    Spouse name: N/A  . Number of children: N/A  . Years of education: N/A   Social History Main Topics  . Smoking status: Current Every Day Smoker    Packs/day: 0.25  . Smokeless tobacco: Never Used  . Alcohol use No  . Drug use: Yes    Types: Marijuana     Comment: once in a blue moon  . Sexual activity: Yes    Birth control/ protection: None   Other Topics Concern  . None   Social  History Narrative   ** Merged History Encounter **        Family History: Family History  Problem Relation Age of Onset  . Heart disease Mother     hole in heart  . Cancer Maternal Grandmother     Allergies: Allergies  Allergen Reactions  . Nubain [Nalbuphine Hcl] Itching    Prescriptions Prior to Admission  Medication Sig Dispense Refill Last Dose  . Prenatal Vit-Fe Fumarate-FA (PRENATAL MULTIVITAMIN) TABS tablet Take 1 tablet by mouth daily at 12 noon.   11/25/2016 at Unknown time  . acetaminophen (TYLENOL) 500 MG tablet Take 1,000 mg by mouth every 6 (six) hours as needed for moderate pain or headache.    Past Month at Unknown time     Review of Systems   All systems reviewed and negative except as stated in HPI  Blood pressure 132/90, pulse 99, temperature 99 F (37.2 C), temperature source Oral, resp. rate 18, last menstrual period 01/31/2016, SpO2 99 %, unknown if currently breastfeeding. General appearance: alert, cooperative, mild distress and noticeably uncomfortable due to pain during contractions. Lungs: clear to auscultation bilaterally Heart: regular rate and rhythm Abdomen: soft, non-tender; bowel sounds normal Extremities: No calf swelling or tenderness Presentation: cephalic Fetal monitoring: baseline 130s, accels absent, possible late decel at 23:29 Uterine activity: contractions q  1-2 mins Dilation: 6.5 Effacement (%): 90 Station: -3, -2 Exam by:: Burnadette PeterLynnette Weston RN/Danielle Federated Department StoresSimpson RN   Prenatal labs: ABO, Rh: --/--/O POS, O POS (01/17 1613) Antibody: NEG (01/17 1613) Rubella: negative 06/25/16 RPR: NON REAC (01/11 1157)  HBsAg:   negative 06/25/16 HIV:   negative 06/25/16 GBS: Positive (12/19 1600)  1 hr Glucola: 117 Genetic screening:  Quad negative Anatomy US: wnl (11/20/16)  Prenatal Transfer Tool  Maternal Diabetes: No Genetic Screening: Normal Maternal Ultrasounds/Referrals: Normal Fetal Ultrasounds or other Referrals:   None Maternal Substance Abuse:  Yes:  Type: Smoker, 4 cigarettes daily Significant Maternal Medications:  None Significant Maternal Lab Results: Lab values include: Group B Strep positive  Results for orders placed or performed during the hospital encounter of 11/25/16 (from the past 24 hour(s))  CBC   Collection Time: 11/25/16 10:47 PM  Result Value Ref Range   WBC 18.0 (H) 4.0 - 10.5 K/uL   RBC 4.05 3.87 - 5.11 MIL/uL   Hemoglobin 11.5 (L) 12.0 - 15.0 g/dL   HCT 96.034.6 (L) 45.436.0 - 09.846.0 %   MCV 85.4 78.0 - 100.0 fL   MCH 28.4 26.0 - 34.0 pg   MCHC 33.2 30.0 - 36.0 g/dL   RDW 11.915.5 14.711.5 - 82.915.5 %   Platelets 412 (H) 150 - 400 K/uL    Patient Active Problem List   Diagnosis Date Noted  . Labor and delivery, indication for care 11/25/2016  . Abdominal trauma 11/11/2016  . Mental disorder 10/12/2016  . Pregnancy, supervision, high-risk 07/16/2016  . History of syphilis 07/16/2016  . History of cesarean delivery 07/16/2016  . Bipolar disease during pregnancy (HCC) 07/16/2016  . BMI 45.0-49.9, adult (HCC) 07/16/2016  . Obesity in pregnancy 07/16/2016  . GBS (group B Streptococcus carrier), +RV culture, currently pregnant 07/16/2016  . History of prior pregnancy with SGA newborn 07/16/2016  . History of pre-eclampsia 11/07/2015    Assessment: Carla Carson is a 31 y.o. [phone] with hx of preeclampsia, c-section, syphilis, gonorrhea and chalmydia at 6052w0d here for increasing frequency of contractions and TOLAC.  #labor: Continue expectant management #Pain: IV fetanyl PRN, planned epidural #FWB: Cat II #ID:  GBS positive #MOF: Breast #MOC: BTL consent signed 09/03/16 #Circ:  Declined  Satira AnisSung W Behr Cislo, Medical Student PGY-2 11/25/2016, 11:21 PM

## 2016-11-25 NOTE — MAU Note (Signed)
Pt presents to MAU by EMS with ctx. Pt states ctx started around 0400 and are q5-6 min. Denies any bleeding or leaking of fluid. Reports good fetal movement. Was 1cm last week at appt.

## 2016-11-25 NOTE — Anesthesia Procedure Notes (Addendum)
Epidural Patient location during procedure: OB Start time: 11/25/2016 11:42 PM End time: 11/25/2016 11:49 PM  Staffing Anesthesiologist: Shona SimpsonHOLLIS, KEVIN D Performed: anesthesiologist   Preanesthetic Checklist Completed: patient identified, site marked, surgical consent, pre-op evaluation, timeout performed, IV checked, risks and benefits discussed and monitors and equipment checked  Epidural Patient position: sitting Prep: ChloraPrep Patient monitoring: heart rate, continuous pulse ox and blood pressure Approach: midline Location: L3-L4 Injection technique: LOR saline  Needle:  Needle type: Tuohy  Needle gauge: 17 G Needle length: 9 cm Catheter type: closed end flexible Catheter size: 20 Guage Test dose: negative and 1.5% lidocaine  Assessment Events: blood not aspirated, injection not painful, no injection resistance and no paresthesia  Additional Notes LOR @ 7  Patient identified. Risks/Benefits/Options discussed with patient including but not limited to bleeding, infection, nerve damage, paralysis, failed block, incomplete pain control, headache, blood pressure changes, nausea, vomiting, reactions to medications, itching and postpartum back pain. Confirmed with bedside nurse the patient's most recent platelet count. Confirmed with patient that they are not currently taking any anticoagulation, have any bleeding history or any family history of bleeding disorders. Patient expressed understanding and wished to proceed. All questions were answered. Sterile technique was used throughout the entire procedure. Please see nursing notes for vital signs. Test dose was given through epidural catheter and negative prior to continuing to dose epidural or start infusion. Warning signs of high block given to the patient including shortness of breath, tingling/numbness in hands, complete motor block, or any concerning symptoms with instructions to call for help. Patient was given instructions on  fall risk and not to get out of bed. All questions and concerns addressed with instructions to call with any issues or inadequate analgesia.    Reason for block:procedure for pain

## 2016-11-26 ENCOUNTER — Encounter: Payer: Self-pay | Admitting: Obstetrics & Gynecology

## 2016-11-26 ENCOUNTER — Encounter (HOSPITAL_COMMUNITY)
Admission: AD | Disposition: A | Discharge status: Home / Self Care | Payer: Self-pay | Source: Ambulatory Visit | Attending: Obstetrics and Gynecology

## 2016-11-26 ENCOUNTER — Inpatient Hospital Stay (HOSPITAL_COMMUNITY): Payer: Medicaid Other | Admitting: Anesthesiology

## 2016-11-26 ENCOUNTER — Encounter (HOSPITAL_COMMUNITY): Payer: Self-pay

## 2016-11-26 DIAGNOSIS — Z302 Encounter for sterilization: Secondary | ICD-10-CM

## 2016-11-26 DIAGNOSIS — Z3A37 37 weeks gestation of pregnancy: Secondary | ICD-10-CM

## 2016-11-26 DIAGNOSIS — O99825 Streptococcus B carrier state complicating the puerperium: Secondary | ICD-10-CM | POA: Diagnosis not present

## 2016-11-26 HISTORY — PX: TUBAL LIGATION: SHX77

## 2016-11-26 LAB — COMPREHENSIVE METABOLIC PANEL
ALBUMIN: 2.9 g/dL — AB (ref 3.5–5.0)
ALK PHOS: 184 U/L — AB (ref 38–126)
ALT: 15 U/L (ref 14–54)
ALT: 14 U/L (ref 14–54)
AST: 20 U/L (ref 15–41)
AST: 18 U/L (ref 15–41)
Albumin: 2.6 g/dL — ABNORMAL LOW (ref 3.5–5.0)
Alkaline Phosphatase: 148 U/L — ABNORMAL HIGH (ref 38–126)
Anion gap: 8 (ref 5–15)
Anion gap: 7 (ref 5–15)
BILIRUBIN TOTAL: 0.4 mg/dL (ref 0.3–1.2)
BUN: <5 mg/dL — ABNORMAL LOW (ref 6–20)
CALCIUM: 8.8 mg/dL — AB (ref 8.9–10.3)
CHLORIDE: 105 mmol/L (ref 101–111)
CO2: 23 mmol/L (ref 22–32)
CO2: 24 mmol/L (ref 22–32)
CREATININE: 0.46 mg/dL (ref 0.44–1.00)
Calcium: 8.3 mg/dL — ABNORMAL LOW (ref 8.9–10.3)
Chloride: 105 mmol/L (ref 101–111)
Creatinine, Ser: 0.49 mg/dL (ref 0.44–1.00)
GFR calc non Af Amer: >60 mL/min (ref >60)
Glucose, Bld: 93 mg/dL (ref 65–99)
Glucose, Bld: 95 mg/dL (ref 65–99)
Potassium: 3.7 mmol/L (ref 3.5–5.1)
Potassium: 3.2 mmol/L — ABNORMAL LOW (ref 3.5–5.1)
SODIUM: 136 mmol/L (ref 135–145)
Sodium: 136 mmol/L (ref 135–145)
TOTAL PROTEIN: 6.3 g/dL — AB (ref 6.5–8.1)
Total Bilirubin: 0.3 mg/dL (ref 0.3–1.2)
Total Protein: 7 g/dL (ref 6.5–8.1)

## 2016-11-26 LAB — CBC
HEMATOCRIT: 31.7 % — AB (ref 36.0–46.0)
Hemoglobin: 10.8 g/dL — ABNORMAL LOW (ref 12.0–15.0)
MCH: 28.9 pg (ref 26.0–34.0)
MCHC: 34.1 g/dL (ref 30.0–36.0)
MCV: 84.8 fL (ref 78.0–100.0)
Platelets: 372 10*3/uL (ref 150–400)
RBC: 3.74 MIL/uL — AB (ref 3.87–5.11)
RDW: 15.6 % — AB (ref 11.5–15.5)
WBC: 20.4 10*3/uL — AB (ref 4.0–10.5)

## 2016-11-26 LAB — PROTEIN / CREATININE RATIO, URINE
Creatinine, Urine: 181 mg/dL
Protein Creatinine Ratio: 0.27 mg/mg{Cre} — ABNORMAL HIGH (ref 0.00–0.15)
Total Protein, Urine: 49 mg/dL

## 2016-11-26 LAB — RPR: RPR: NONREACTIVE

## 2016-11-26 SURGERY — LIGATION, FALLOPIAN TUBE, POSTPARTUM
Anesthesia: Monitor Anesthesia Care | Site: Abdomen | Laterality: Bilateral

## 2016-11-26 MED ORDER — PROPOFOL 10 MG/ML IV BOLUS
INTRAVENOUS | Status: AC
  Filled 2016-11-26: qty 20

## 2016-11-26 MED ORDER — SIMETHICONE 80 MG PO CHEW: 80.0000 mg | CHEWABLE_TABLET | ORAL | Status: DC | PRN

## 2016-11-26 MED ORDER — IBUPROFEN 600 MG PO TABS
600.0000 mg | ORAL_TABLET | Freq: Four times a day (QID) | ORAL | Status: DC
  Administered 2016-11-26 – 2016-11-28 (×8): 600 mg via ORAL
  Filled 2016-11-26 (×8): qty 1

## 2016-11-26 MED ORDER — ONDANSETRON HCL 4 MG/2ML IJ SOLN: 4.0000 mg | Freq: Four times a day (QID) | INTRAMUSCULAR | Status: DC | PRN

## 2016-11-26 MED ORDER — FENTANYL CITRATE (PF) 100 MCG/2ML IJ SOLN
INTRAMUSCULAR | Status: AC
  Filled 2016-11-26: qty 2

## 2016-11-26 MED ORDER — DEXAMETHASONE SODIUM PHOSPHATE 4 MG/ML IJ SOLN
INTRAMUSCULAR | Status: AC
  Filled 2016-11-26: qty 1

## 2016-11-26 MED ORDER — SENNOSIDES-DOCUSATE SODIUM 8.6-50 MG PO TABS
2.0000 | ORAL_TABLET | ORAL | Status: DC
  Administered 2016-11-26 – 2016-11-27 (×2): 2 via ORAL
  Filled 2016-11-26 (×2): qty 2

## 2016-11-26 MED ORDER — LACTATED RINGERS IV SOLN
INTRAVENOUS | Status: DC
  Administered 2016-11-26 (×2): via INTRAVENOUS

## 2016-11-26 MED ORDER — TETANUS-DIPHTH-ACELL PERTUSSIS 5-2.5-18.5 LF-MCG/0.5 IM SUSP: 0.5000 mL | Freq: Once | INTRAMUSCULAR | Status: DC

## 2016-11-26 MED ORDER — FAMOTIDINE 20 MG PO TABS
40.0000 mg | ORAL_TABLET | Freq: Once | ORAL | Status: AC
  Administered 2016-11-26: 40 mg via ORAL
  Filled 2016-11-26: qty 2

## 2016-11-26 MED ORDER — ZOLPIDEM TARTRATE 5 MG PO TABS: 5.0000 mg | ORAL_TABLET | Freq: Every evening | ORAL | Status: DC | PRN

## 2016-11-26 MED ORDER — MIDAZOLAM HCL 2 MG/2ML IJ SOLN
INTRAMUSCULAR | Status: DC | PRN
  Administered 2016-11-26 (×2): 2 mg via INTRAVENOUS

## 2016-11-26 MED ORDER — KETOROLAC TROMETHAMINE 30 MG/ML IJ SOLN
INTRAMUSCULAR | Status: AC
  Filled 2016-11-26: qty 1

## 2016-11-26 MED ORDER — MIDAZOLAM HCL 2 MG/2ML IJ SOLN
INTRAMUSCULAR | Status: AC
  Filled 2016-11-26: qty 2

## 2016-11-26 MED ORDER — ONDANSETRON HCL 4 MG/2ML IJ SOLN: 4.0000 mg | INTRAMUSCULAR | Status: DC | PRN

## 2016-11-26 MED ORDER — COCONUT OIL OIL: 1.0000 "application " | TOPICAL_OIL | Status: DC | PRN

## 2016-11-26 MED ORDER — PROPOFOL 10 MG/ML IV BOLUS
INTRAVENOUS | Status: DC | PRN
  Administered 2016-11-26 (×3): 20 mg via INTRAVENOUS

## 2016-11-26 MED ORDER — OXYCODONE HCL 5 MG/5ML PO SOLN: 5.0000 mg | Freq: Once | ORAL | Status: DC | PRN

## 2016-11-26 MED ORDER — BENZOCAINE-MENTHOL 20-0.5 % EX AERO
1.0000 "application " | INHALATION_SPRAY | CUTANEOUS | Status: DC | PRN
  Administered 2016-11-27: 1 via TOPICAL
  Filled 2016-11-26: qty 56

## 2016-11-26 MED ORDER — ONDANSETRON HCL 4 MG PO TABS
4.0000 mg | ORAL_TABLET | ORAL | Status: DC | PRN
Start: 2016-11-26 — End: 2016-11-28

## 2016-11-26 MED ORDER — METOCLOPRAMIDE HCL 10 MG PO TABS
10.0000 mg | ORAL_TABLET | Freq: Once | ORAL | Status: AC
  Administered 2016-11-26: 10 mg via ORAL
  Filled 2016-11-26: qty 1

## 2016-11-26 MED ORDER — ONDANSETRON HCL 4 MG/2ML IJ SOLN
INTRAMUSCULAR | Status: AC
  Filled 2016-11-26: qty 2

## 2016-11-26 MED ORDER — LIDOCAINE-EPINEPHRINE (PF) 2 %-1:200000 IJ SOLN
INTRAMUSCULAR | Status: AC
  Filled 2016-11-26: qty 20

## 2016-11-26 MED ORDER — PRENATAL MULTIVITAMIN CH
1.0000 | ORAL_TABLET | Freq: Every day | ORAL | Status: DC
  Administered 2016-11-27 – 2016-11-28 (×2): 1 via ORAL
  Filled 2016-11-26 (×2): qty 1

## 2016-11-26 MED ORDER — BUPIVACAINE HCL (PF) 0.25 % IJ SOLN
INTRAMUSCULAR | Status: DC | PRN
  Administered 2016-11-26: 23 mL

## 2016-11-26 MED ORDER — FENTANYL CITRATE (PF) 100 MCG/2ML IJ SOLN
INTRAMUSCULAR | Status: DC | PRN
  Administered 2016-11-26: 100 ug via INTRAVENOUS

## 2016-11-26 MED ORDER — DIBUCAINE 1 % RE OINT: 1.0000 "application " | TOPICAL_OINTMENT | RECTAL | Status: DC | PRN

## 2016-11-26 MED ORDER — ACETAMINOPHEN 325 MG PO TABS
650.0000 mg | ORAL_TABLET | ORAL | Status: DC | PRN
  Administered 2016-11-26 (×2): 650 mg via ORAL
  Filled 2016-11-26 (×2): qty 2

## 2016-11-26 MED ORDER — LIDOCAINE-EPINEPHRINE (PF) 2 %-1:200000 IJ SOLN
INTRAMUSCULAR | Status: DC | PRN
  Administered 2016-11-26 (×2): 5 mL via EPIDURAL
  Administered 2016-11-26: 8 mL via EPIDURAL
  Administered 2016-11-26: 2 mL via EPIDURAL

## 2016-11-26 MED ORDER — DIPHENHYDRAMINE HCL 25 MG PO CAPS: 25.0000 mg | ORAL_CAPSULE | Freq: Four times a day (QID) | ORAL | Status: DC | PRN

## 2016-11-26 MED ORDER — WITCH HAZEL-GLYCERIN EX PADS: 1.0000 "application " | MEDICATED_PAD | CUTANEOUS | Status: DC | PRN

## 2016-11-26 MED ORDER — FENTANYL CITRATE (PF) 100 MCG/2ML IJ SOLN
25.0000 ug | INTRAMUSCULAR | Status: DC | PRN
  Administered 2016-11-26: 50 ug via INTRAVENOUS

## 2016-11-26 MED ORDER — OXYCODONE HCL 5 MG PO TABS: 5.0000 mg | ORAL_TABLET | Freq: Once | ORAL | Status: DC | PRN

## 2016-11-26 MED ORDER — KETOROLAC TROMETHAMINE 30 MG/ML IJ SOLN
INTRAMUSCULAR | Status: DC | PRN
  Administered 2016-11-26: 30 mg via INTRAVENOUS

## 2016-11-26 MED ORDER — OXYCODONE HCL 5 MG PO TABS
5.0000 mg | ORAL_TABLET | Freq: Once | ORAL | Status: AC
  Administered 2016-11-26: 5 mg via ORAL
  Filled 2016-11-26: qty 1

## 2016-11-26 MED ORDER — DEXAMETHASONE SODIUM PHOSPHATE 4 MG/ML IJ SOLN
INTRAMUSCULAR | Status: DC | PRN
  Administered 2016-11-26: 4 mg via INTRAVENOUS

## 2016-11-26 MED ORDER — SCOPOLAMINE 1 MG/3DAYS TD PT72
MEDICATED_PATCH | TRANSDERMAL | Status: DC | PRN
  Administered 2016-11-26: 1 via TRANSDERMAL

## 2016-11-26 MED ORDER — BUPIVACAINE HCL (PF) 0.25 % IJ SOLN
INTRAMUSCULAR | Status: AC
  Filled 2016-11-26: qty 30

## 2016-11-26 MED ORDER — LIDOCAINE-EPINEPHRINE (PF) 2 %-1:200000 IJ SOLN: INTRAMUSCULAR | Status: DC | PRN

## 2016-11-26 MED ORDER — ONDANSETRON HCL 4 MG/2ML IJ SOLN
INTRAMUSCULAR | Status: DC | PRN
  Administered 2016-11-26: 4 mg via INTRAVENOUS

## 2016-11-26 MED ORDER — SODIUM BICARBONATE 8.4 % IV SOLN
INTRAVENOUS | Status: AC
  Filled 2016-11-26: qty 50

## 2016-11-26 MED ORDER — OXYCODONE HCL 5 MG PO TABS
5.0000 mg | ORAL_TABLET | Freq: Once | ORAL | Status: DC
  Filled 2016-11-26: qty 1

## 2016-11-26 MED ORDER — PROPOFOL 500 MG/50ML IV EMUL
INTRAVENOUS | Status: DC | PRN
  Administered 2016-11-26: 75 ug/kg/min via INTRAVENOUS

## 2016-11-26 MED ORDER — OXYCODONE HCL 5 MG PO TABS
5.0000 mg | ORAL_TABLET | Freq: Four times a day (QID) | ORAL | Status: DC | PRN
  Administered 2016-11-27 (×3): 5 mg via ORAL
  Filled 2016-11-26 (×2): qty 1

## 2016-11-26 MED ORDER — SCOPOLAMINE 1 MG/3DAYS TD PT72
MEDICATED_PATCH | TRANSDERMAL | Status: AC
  Filled 2016-11-26: qty 1

## 2016-11-26 SURGICAL SUPPLY — 21 items
CLOTH BEACON ORANGE TIMEOUT ST (SAFETY) ×3 IMPLANT
DRSG OPSITE POSTOP 3X4 (GAUZE/BANDAGES/DRESSINGS) ×3 IMPLANT
DURAPREP 26ML APPLICATOR (WOUND CARE) ×3 IMPLANT
GLOVE BIO SURGEON STRL SZ7.5 (GLOVE) ×3 IMPLANT
GLOVE BIOGEL PI IND STRL 7.0 (GLOVE) ×2 IMPLANT
GLOVE BIOGEL PI INDICATOR 7.0 (GLOVE) ×4
GOWN STRL REUS W/TWL LRG LVL3 (GOWN DISPOSABLE) ×3 IMPLANT
GOWN STRL REUS W/TWL XL LVL3 (GOWN DISPOSABLE) ×3 IMPLANT
NEEDLE HYPO 22GX1.5 SAFETY (NEEDLE) ×3 IMPLANT
NS IRRIG 1000ML POUR BTL (IV SOLUTION) ×3 IMPLANT
PACK ABDOMINAL MINOR (CUSTOM PROCEDURE TRAY) ×3 IMPLANT
PROTECTOR NERVE ULNAR (MISCELLANEOUS) ×3 IMPLANT
SPONGE LAP 4X18 X RAY DECT (DISPOSABLE) IMPLANT
SUT MNCRL AB 4-0 PS2 18 (SUTURE) ×3 IMPLANT
SUT PLAIN 0 NONE (SUTURE) ×3 IMPLANT
SUT VIC AB 0 CT1 27 (SUTURE) ×3
SUT VIC AB 0 CT1 27XBRD ANBCTR (SUTURE) ×1 IMPLANT
SYR CONTROL 10ML LL (SYRINGE) ×3 IMPLANT
TOWEL OR 17X24 6PK STRL BLUE (TOWEL DISPOSABLE) ×6 IMPLANT
TRAY FOLEY CATH SILVER 14FR (SET/KITS/TRAYS/PACK) ×3 IMPLANT
WATER STERILE IRR 1000ML POUR (IV SOLUTION) ×3 IMPLANT

## 2016-11-26 NOTE — Anesthesia Postprocedure Evaluation (Addendum)
Anesthesia Post Note  Patient: Carla Carson  Procedure(s) Performed: * No procedures listed *  Patient location during evaluation: Mother Baby Anesthesia Type: Epidural Level of consciousness: awake and alert and oriented Pain management: satisfactory to patient Vital Signs Assessment: post-procedure vital signs reviewed and stable Respiratory status: spontaneous breathing and nonlabored ventilation Cardiovascular status: stable Postop Assessment: no headache, no backache, no signs of nausea or vomiting, adequate PO intake and patient able to bend at knees (patient up walking) Anesthetic complications: no        Last Vitals:  Vitals:   11/26/16 0425 11/26/16 0525  BP: 124/67 113/66  Pulse: 79 70  Resp: 20 20  Temp: 36.8 C 36.5 C    Last Pain:  Vitals:   11/26/16 0525  TempSrc: Oral  PainSc:    Pain Goal:                 Madison HickmanGREGORY,SUZANNE

## 2016-11-26 NOTE — Lactation Note (Signed)
This note was copied from a baby's chart. Lactation Consultation Note  Mother states she breastfed all her children for 6 months.  Mother dressed for BTL. Mother states she has to go to the bathroom.  Assisted w/ her IV pole to bathroom and told her LC will visit later today.  Patient Name: Boy Mitzi Davenportrenese Glatfelter ZOXWR'UToday's Date: 11/26/2016 Reason for consult: Initial assessment   Maternal Data Does the patient have breastfeeding experience prior to this delivery?: Yes  Feeding Feeding Type: Breast Fed Length of feed: 15 min  LATCH Score/Interventions Latch: Repeated attempts needed to sustain latch, nipple held in mouth throughout feeding, stimulation needed to elicit sucking reflex. (few sucks)  Audible Swallowing: None (hand expressed a few drops in mouth) Intervention(s): Skin to skin;Hand expression  Type of Nipple: Everted at rest and after stimulation  Comfort (Breast/Nipple): Soft / non-tender     Hold (Positioning): No assistance needed to correctly position infant at breast. Intervention(s): Breastfeeding basics reviewed;Support Pillows;Skin to skin  LATCH Score: 7  Lactation Tools Discussed/Used     Consult Status Consult Status: Follow-up Date: 11/26/16 Follow-up type: In-patient    Dahlia ByesBerkelhammer, Negar Sieler [facility] 11/26/2016, 11:04 AM

## 2016-11-26 NOTE — Progress Notes (Signed)
CM / UR chart review completed.  

## 2016-11-26 NOTE — Transfer of Care (Signed)
Immediate Anesthesia Transfer of Care Note  Patient: Carla Carson  Procedure(s) Performed: Procedure(s): POST PARTUM TUBAL LIGATION (Bilateral)  Patient Location: PACU  Anesthesia Type:MAC and Epidural  Level of Consciousness: awake, alert  and oriented  Airway & Oxygen Therapy: Patient Spontanous Breathing  Post-op Assessment: Report given to RN and Post -op Vital signs reviewed and stable  Post vital signs: Reviewed and stable  Last Vitals:  Vitals:   11/26/16 0525 11/26/16 0959  BP: 113/66 (!) 123/53  Pulse: 70 73  Resp: 20   Temp: 36.5 C 36.7 C    Last Pain:  Vitals:   11/26/16 1000  TempSrc:   PainSc: 0-No pain         Complications: No apparent anesthesia complications

## 2016-11-26 NOTE — Op Note (Signed)
Carla Carson 11/25/2016 - 11/26/2016  PREOPERATIVE DIAGNOSES: Multiparity, undesired fertility  POSTOPERATIVE DIAGNOSES: Multiparity, undesired fertility  PROCEDURE:  Postpartum Bilateral Tubal Sterilization   SURGEON: Dr. Casimiro NeedleMichael L. Ervin  ANESTHESIA:  Epidural and local analgesia using 30 ml of 0.5% Marcaine  COMPLICATIONS:  None immediate.  ESTIMATED BLOOD LOSS: 10 ml.   INDICATIONS:  31 y.o. [id number] with undesired fertility,status post vaginal delivery, desires permanent sterilization.  Other reversible forms of contraception were discussed with patient; she declines all other modalities. Risks of procedure discussed with patient including but not limited to: risk of regret, permanence of method, bleeding, infection, injury to surrounding organs and need for additional procedures.  Failure risk of 1 -2 % with increased risk of ectopic gestation if pregnancy occurs was also discussed with patient.      FINDINGS:  Normal uterus, tubes, and ovaries.   PROCEDURE DETAILS: The patient was taken to the operating room where her epidural anesthesia was dosed up to surgical level and found to be adequate.  She was then placed in the dorsal supine position and prepped and draped in sterile fashion.  After an adequate timeout was performed, attention was turned to the patient's abdomen where a small transverse skin incision was made under the umbilical fold. The incision was taken down to the layer of fascia using the scalpel, and fascia was incised, and extended bilaterally using Mayo scissors. The peritoneum was entered in a sharp fashion. Attention was then turned to the patient's uterus, and left fallopian tube was identified and followed out to the fimbriated end.  A loop of the tube was pulled up and double ligated. A similar process was carried out on the right side allowing for bilateral tubal sterilization.  Good hemostasis was noted overall.  Local analgesia was injected into the cut area.The  instruments were then removed from the patient's abdomen and the fascial incision was repaired with 0 Vicryl, and the skin was closed with a 4-0 Vicryl subcuticular stitch. The patient tolerated the procedure well.  Instrument, sponge, and needle counts were correct times two.  The patient was then taken to the recovery room awake and in stable condition.    Ernestina PennaNicholas Schenk MD OB Fellow Faculty Practice, [facility] [place]

## 2016-11-26 NOTE — Anesthesia Postprocedure Evaluation (Signed)
Anesthesia Post Note  Patient: Carla Carson  Procedure(s) Performed: Procedure(s) (LRB): POST PARTUM TUBAL LIGATION (Bilateral)  Patient location during evaluation: PACU Anesthesia Type: MAC and Epidural Level of consciousness: awake and alert Carson management: Carson level controlled Vital Signs Assessment: post-procedure vital signs reviewed and stable Respiratory status: spontaneous breathing, nonlabored ventilation, respiratory function stable and patient connected to nasal cannula oxygen Cardiovascular status: stable and blood pressure returned to baseline Anesthetic complications: no        Last Vitals:  Vitals:   11/26/16 1430 11/26/16 1445  BP: 109/62 114/68  Pulse: 80 78  Resp: 16 15  Temp:      Last Carson:  Vitals:   11/26/16 1445  TempSrc:   PainSc: 0-No Carson   Carson Goal: Patients Stated Carson Goal: 6 (11/26/16 1445)               [place]

## 2016-11-26 NOTE — Progress Notes (Signed)
Called due to patient having abdominal pain after BTL earlier today despite ibuprofen and acetaminophen administration. Provided a one time dose of oxycodone 5mg . She had initial relief of pain, but the pain then returned. The pain is described as achy and crampy and involving bilateral lower quadrants.   General: appears uncomfortable; no acute distress Abdomen: soft, diffusely tender to palpation, worse in bilateral lower quadrants compared to upper quadrants. Incision without bleeding. Honeycomb dressing in place.   Will provide additional one time dose of oxycodone now. Will also start oxycodone 5mg  q6hr PRN pain going forward.   Gorden HarmsMegan Hellon Vaccarella, MD PGY-2

## 2016-11-26 NOTE — Progress Notes (Signed)
OB Attending  Pt for PPBTL Risks/failure rate and post op care reviewed Pt verbalized understanding and desires to proceed.

## 2016-11-26 NOTE — Anesthesia Preprocedure Evaluation (Signed)
Anesthesia Evaluation  Patient identified by MRN, date of birth, ID band Patient awake    Reviewed: Allergy & Precautions, H&P , NPO status , Patient's Chart, lab work & pertinent test results  Airway Mallampati: II   Neck ROM: full    Dental   Pulmonary Current Smoker,    breath sounds clear to auscultation       Cardiovascular hypertension,  Rhythm:regular Rate:Normal     Neuro/Psych PSYCHIATRIC DISORDERS Bipolar Disorder    GI/Hepatic   Endo/Other  Morbid obesity  Renal/GU      Musculoskeletal   Abdominal   Peds  Hematology   Anesthesia Other Findings   Reproductive/Obstetrics                             Anesthesia Physical Anesthesia Plan  ASA: II  Anesthesia Plan: Epidural and MAC   Post-op Pain Management:    Induction: Intravenous  Airway Management Planned: Simple Face Mask  Additional Equipment:   Intra-op Plan:   Post-operative Plan:   Informed Consent: I have reviewed the patients History and Physical, chart, labs and discussed the procedure including the risks, benefits and alternatives for the proposed anesthesia with the patient or authorized representative who has indicated his/her understanding and acceptance.     Plan Discussed with: CRNA, Anesthesiologist and Surgeon  Anesthesia Plan Comments:         Anesthesia Quick Evaluation

## 2016-11-26 NOTE — Progress Notes (Signed)
Pt at nurses station desk asking to go outside and smoke. Encouraged pt not to go smoke, offered to call MD and get her a nicotine patch but she declines. Pt states that she is going outside to smoke.

## 2016-11-27 ENCOUNTER — Encounter (HOSPITAL_COMMUNITY): Payer: Self-pay | Admitting: Obstetrics and Gynecology

## 2016-11-27 MED ORDER — POTASSIUM CHLORIDE CRYS ER 20 MEQ PO TBCR
40.0000 meq | EXTENDED_RELEASE_TABLET | Freq: Once | ORAL | Status: AC
  Administered 2016-11-27: 40 meq via ORAL
  Filled 2016-11-27: qty 2

## 2016-11-27 NOTE — Progress Notes (Signed)
POSTPARTUM PROGRESS NOTE  Post Partum/Op day #1 Subjective:  Carla Carson is a 31 y.o. [id number] 4447w1d s/p SVD/BTL.  No acute events overnight.  Pt denies problems with ambulating, voiding or po intake.  She denies nausea or vomiting.  Pain is moderately controlled.  She has not had flatus. She has not had bowel movement.  Lochia Minimal.   Objective: Blood pressure 114/78, pulse 69, temperature 97.9 F (36.6 C), temperature source Oral, resp. rate 18, height 5\' 3"  (1.6 m), weight 117.9 kg (260 lb), last menstrual period 01/31/2016, SpO2 98 %, unknown if currently breastfeeding.  Physical Exam:  General: alert, cooperative and no distress Lochia:normal flow Chest: CTAB Heart: RRR no m/r/g Abdomen: +BS, soft, nontender,  Uterine Fundus: firm DVT Evaluation: No calf swelling or tenderness Extremities: no edema   Recent Labs  11/25/16 2247 11/26/16 0507  HGB 11.5* 10.8*  HCT 34.6* 31.7*    Assessment/Plan:  ASSESSMENT: Carla Carson is a 31 y.o. [id number] 6947w1d s/p SVD/BTL.  Patient has not passed gas.  If she is able to pass gas she can be discharged in the afternoon.    LOS: 2 days   Renne Muscaaniel L Warden, MD PGY-1 [facility], [facility]  11/27/2016, 9:42 AM   CNM attestation Post Partum Day #1/Post Op Day #1 I have seen and examined this patient and agree with above documentation in the resident's note.   Carla Carson is a 31 y.o. [id number] s/p VBAC/BTL.  Pt denies problems with ambulating, voiding or po intake. Pain is well controlled.  Plan for birth control is bilateral tubal ligation.  Method of Feeding: breast  PE:  BP 104/60 (BP Location: Right Arm)   Pulse 70   Temp 98.2 F (36.8 C) (Oral)   Resp 20   Ht 5\' 3"  (1.6 m)   Wt 117.9 kg (260 lb)   LMP 01/31/2016   SpO2 100%   Breastfeeding? Unknown   BMI 46.06 kg/m  Fundus firm Inc: covered w/ honeycomb; dry and intact  Plan for discharge: 11/28/16  Cam HaiSHAW, KIMBERLY, CNM 1:46 PM   11/27/2016

## 2016-11-27 NOTE — Lactation Note (Signed)
This note was copied from a baby's chart. Lactation Consultation Note Baby is latching well and swallowing often. Mother denies any pain.  Demonstrated breast compression in the event that baby needs stimulation to suck. Follow-up as needed.  Patient Name: Carla Carson Reason for consult: Follow-up assessment   Maternal Data Formula Feeding for Exclusion: No  Feeding Feeding Type: Breast Fed  LATCH Score/Interventions Latch: Grasps breast easily, tongue down, lips flanged, rhythmical sucking.  Audible Swallowing: Spontaneous and intermittent  Type of Nipple: Everted at rest and after stimulation  Comfort (Breast/Nipple): Soft / non-tender     Hold (Positioning): No assistance needed to correctly position infant at breast.  LATCH Score: 10  Lactation Tools Discussed/Used     Consult Status      Carla Carson, Carla Carson Carson, 12:39 PM
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
## 2016-11-28 MED ORDER — DOCUSATE SODIUM 100 MG PO CAPS: 100.0000 mg | ORAL_CAPSULE | Freq: Two times a day (BID) | ORAL | 0 refills | Status: DC

## 2016-11-28 MED ORDER — IBUPROFEN 600 MG PO TABS: 600.0000 mg | ORAL_TABLET | Freq: Four times a day (QID) | ORAL | 0 refills | Status: DC | PRN

## 2016-11-28 MED ORDER — OXYCODONE HCL 5 MG PO TABS: 5.0000 mg | ORAL_TABLET | Freq: Four times a day (QID) | ORAL | 0 refills | Status: DC | PRN

## 2016-11-28 NOTE — Progress Notes (Signed)
Discharge education complete, prescription given, discharge instructions and follow up appointment discussed. Patient verbalized understanding. 

## 2016-11-28 NOTE — Lactation Note (Signed)
This note was copied from a baby's chart. Lactation Consultation Note  Patient Name: Carla Carson ZOXWR'UToday's Date: 11/28/2016  Follow up visit made prior to discharge.  Mom states breastfeeding is going awesome.  Baby has had good feedings and output.  Reviewed basics and engorgement treatment.  Lactation outpatient services and support information reviewed and encouraged prn.   Maternal Data    Feeding Feeding Type: Breast Milk Length of feed: 15 min  LATCH Score/Interventions                      Lactation Tools Discussed/Used     Consult Status      [name], [name] 11/28/2016, 9:45 AM

## 2016-11-28 NOTE — Discharge Instructions (Signed)
Postpartum Care After Vaginal Delivery °The period of time right after you deliver your newborn is called the postpartum period. °What kind of medical care will I receive? °· You may continue to receive fluids and medicines through an IV tube inserted into one of your veins. °· If an incision was made near your vagina (episiotomy) or if you had some vaginal tearing during delivery, cold compresses may be placed on your episiotomy or your tear. This helps to reduce pain and swelling. °· You may be given a squirt bottle to use when you go to the bathroom. You may use this until you are comfortable wiping as usual. To use the squirt bottle, follow these steps: °¨ Before you urinate, fill the squirt bottle with warm water. Do not use hot water. °¨ After you urinate, while you are sitting on the toilet, use the squirt bottle to rinse the area around your urethra and vaginal opening. This rinses away any urine and blood. °¨ You may do this instead of wiping. As you start healing, you may use the squirt bottle before wiping yourself. Make sure to wipe gently. °¨ Fill the squirt bottle with clean water every time you use the bathroom. °· You will be given sanitary pads to wear. °How can I expect to feel? °· You may not feel the need to urinate for several hours after delivery. °· You will have some soreness and pain in your abdomen and vagina. °· If you are breastfeeding, you may have uterine contractions every time you breastfeed for up to several weeks postpartum. Uterine contractions help your uterus return to its normal size. °· It is normal to have vaginal bleeding (lochia) after delivery. The amount and appearance of lochia is often similar to a menstrual period in the first week after delivery. It will gradually decrease over the next few weeks to a dry, yellow-brown discharge. For most women, lochia stops completely by 6-8 weeks after delivery. Vaginal bleeding can vary from woman to woman. °· Within the first few  days after delivery, you may have breast engorgement. This is when your breasts feel heavy, full, and uncomfortable. Your breasts may also throb and feel hard, tightly stretched, warm, and tender. After this occurs, you may have milk leaking from your breasts. Your health care provider can help you relieve discomfort due to breast engorgement. Breast engorgement should go away within a few days. °· You may feel more sad or worried than normal due to hormonal changes after delivery. These feelings should not last more than a few days. If these feelings do not go away after several days, speak with your health care provider. °How should I care for myself? °· Tell your health care provider if you have pain or discomfort. °· Drink enough water to keep your urine clear or pale yellow. °· Wash your hands thoroughly with soap and water for at least 20 seconds after changing your sanitary pads, after using the toilet, and before holding or feeding your baby. °· If you are not breastfeeding, avoid touching your breasts a lot. Doing this can make your breasts produce more milk. °· If you become weak or lightheaded, or you feel like you might faint, ask for help before: °¨ Getting out of bed. °¨ Showering. °· Change your sanitary pads frequently. Watch for any changes in your flow, such as a sudden increase in volume, a change in color, the passing of large blood clots. If you pass a blood clot from your vagina, save it   to show to your health care provider. Do not flush blood clots down the toilet without having your health care provider look at them.  Make sure that all your vaccinations are up to date. This can help protect you and your baby from getting certain diseases. You may need to have immunizations done before you leave the hospital.  If desired, talk with your health care provider about methods of family planning or birth control (contraception). How can I start bonding with my baby? Spending as much time as  possible with your baby is very important. During this time, you and your baby can get to know each other and develop a bond. Having your baby stay with you in your room (rooming in) can give you time to get to know your baby. Rooming in can also help you become comfortable caring for your baby. Breastfeeding can also help you bond with your baby. How can I plan for returning home with my baby?  Make sure that you have a car seat installed in your vehicle.  Your car seat should be checked by a certified car seat installer to make sure that it is installed safely.  Make sure that your baby fits into the car seat safely.  Ask your health care provider any questions you have about caring for yourself or your baby. Make sure that you are able to contact your health care provider with any questions after leaving the hospital. This information is not intended to replace advice given to you by your health care provider. Make sure you discuss any questions you have with your health care provider. Document Released: 08/09/2007 Document Revised: 03/16/2016 Document Reviewed: 09/16/2015 Elsevier Interactive Patient Education  2017 Elsevier Inc.  Postpartum Tubal Ligation, Care After Refer to this sheet in the next few weeks. These instructions provide you with information about caring for yourself after your procedure. Your health care provider may also give you more specific instructions. Your treatment has been planned according to current medical practices, but problems sometimes occur. Call your health care provider if you have any problems or questions after your procedure. What can I expect after the procedure? After the procedure, it is common to have:  A sore throat.  Bruising or pain in your back.  Nausea or vomiting.  Dizziness.  Mild abdominal discomfort or pain, such as cramping, gas pain, or feeling bloated.  Soreness where the incision was made.  Tiredness.  Pain in your  shoulders. Follow these instructions at home: Medicines  Take over-the-counter and prescription medicines only as told by your health care provider.  Do not take aspirin because it can cause bleeding.  Do not drive or operate heavy machinery while taking prescription pain medicine. Activity  Rest for the rest of the day.  Gradually return to your normal activities over the next few days.  Do not have sex, douche, or put a tampon or anything else in your vagina for 6 weeks or as long as told by your health care provider.  Do not lift anything that is heavier than your baby for 2 weeks or as long as told by your health care provider. Incision care  Follow instructions from your health care provider about how to take care of your incision. Make sure you:  Wash your hands with soap and water before you change your bandage (dressing). If soap and water are not available, use hand sanitizer.  Change your dressing as told by your health care provider.  Leave stitches (  sutures) in place. They may need to stay in place for 2 weeks or longer.  Check your incision area every day for signs of infection. Check for:  More redness, swelling, or pain.  More fluid or blood.  Warmth.  Pus or a bad smell. Other Instructions  Do not take baths, swim, or use a hot tub until your health care provider approves. You may take showers.  Keep all follow-up visits as told by your health care provider. This is important. Contact a health care provider if:  You have more redness, swelling, or pain around your incision.  Your incision feels warm to the touch.  You have pus or a bad smell coming from your incision.  The edges of your incision break open after the sutures have been removed.  Your pain does not improve after 2-3 days.  You have a rash.  You repeatedly become dizzy or lightheaded.  Your pain medicine is not helping.  You are constipated. Get help right away if:  You have  a fever.  You faint.  You have pain in your abdomen that gets worse.  You have fluid or blood coming from your sutures.  You have shortness of breath or difficulty breathing.  You have chest pain or leg pain.  You have ongoing nausea or diarrhea. This information is not intended to replace advice given to you by your health care provider. Make sure you discuss any questions you have with your health care provider. Document Released: 04/12/2012 Document Revised: 03/16/2016 Document Reviewed: 09/22/2015 Elsevier Interactive Patient Education  2017 [employer]

## 2016-11-28 NOTE — Discharge Summary (Signed)
OB Discharge Summary     Patient Name: Carla Carson DOB: 09/29/1986 MRN: [id number]  Date of admission: 11/25/2016 Delivering MD: Gorden HarmsAMPBELL, MEGAN C   Date of discharge: 11/28/2016  Admitting diagnosis: 38wks ctx desires sterilization Intrauterine pregnancy: [id number]     Secondary diagnosis:  Active Problems:   Labor and delivery, indication for care  Additional problems: elevated blood pressures that have since normalized for >24hrs at the time of discharge     Discharge diagnosis: Term Pregnancy Delivered                                                                                                Post partum procedures:postpartum tubal ligation  Augmentation: AROM  Complications: None  Hospital course:  Onset of Labor With Vaginal Delivery     31 y.o. yo [id number] at 445w1d was admitted in Active Labor on 11/25/2016. Patient had an uncomplicated labor course as follows:  Membrane Rupture Time/Date: 1:54 AM ,11/26/2016   Intrapartum Procedures: Episiotomy: None [1]                                         Lacerations:  None [1]  Patient had a delivery of a Viable infant. 11/26/2016  Information for the patient's newborn:  Verdie Drownhomas, Boy Fizza [[id number]][[id number]]  Delivery Method: VBAC, Spontaneous (Filed from Delivery Summary)    Pateint had an uncomplicated postpartum course.  She is ambulating, tolerating a regular diet, passing flatus, and urinating well. Patient is discharged home in stable condition on 11/28/16.   Physical exam  Vitals:   11/27/16 1320 11/27/16 1715 11/27/16 1740 11/28/16 0600  BP: 104/60 139/81 118/75 124/81  Pulse: 70 75 64 63  Resp: 20 18 16 18   Temp: 98.2 F (36.8 C) 98.4 F (36.9 C) 97.9 F (36.6 C) 98.4 F (36.9 C)  TempSrc: Oral Oral Oral   SpO2: 100%  100%   Weight:      Height:       General: alert, cooperative and no distress Lochia: appropriate Uterine Fundus: firm Incision: Healing well with no significant drainage, No significant erythema,  Dressing is clean, dry, and intact DVT Evaluation: No evidence of DVT seen on physical exam. Labs: Lab Results  Component Value Date   WBC 20.4 (H) 11/26/2016   HGB 10.8 (L) 11/26/2016   HCT 31.7 (L) 11/26/2016   MCV 84.8 11/26/2016   PLT 372 11/26/2016   CMP Latest Ref Rng & Units 11/26/2016  Glucose 65 - 99 mg/dL 95  BUN 6 - 20 mg/dL <6(L<5(L)  Creatinine 8.750.44 - 1.00 mg/dL 6.430.46  Sodium 329135 - 518145 mmol/L 136  Potassium 3.5 - 5.1 mmol/L 3.2(L)  Chloride 101 - 111 mmol/L 105  CO2 22 - 32 mmol/L 24  Calcium 8.9 - 10.3 mg/dL 8.3(L)  Total Protein 6.5 - 8.1 g/dL 6.3(L)  Total Bilirubin 0.3 - 1.2 mg/dL 0.4  Alkaline Phos 38 - 126 U/L 148(H)  AST 15 - 41 U/L 18  ALT 14 - 54 U/L 14  Discharge instruction: per After Visit Summary and "Baby and Me Booklet".  After visit meds:  Allergies as of 11/28/2016      Reactions   Nubain [nalbuphine Hcl] Itching      Medication List    STOP taking these medications   acetaminophen 500 MG tablet Commonly known as:  TYLENOL   prenatal multivitamin Tabs tablet     TAKE these medications   docusate sodium 100 MG capsule Commonly known as:  COLACE Take 1 capsule (100 mg total) by mouth 2 (two) times daily.   ibuprofen 600 MG tablet Commonly known as:  ADVIL,MOTRIN Take 1 tablet (600 mg total) by mouth every 6 (six) hours as needed for mild pain or moderate pain.   oxyCODONE 5 MG immediate release tablet Commonly known as:  Oxy IR/ROXICODONE Take 1 tablet (5 mg total) by mouth every 6 (six) hours as needed for severe pain.       Diet: routine diet  Activity: Advance as tolerated. Pelvic rest for 6 weeks.   Outpatient follow up:6 weeks Follow up Appt: Future Appointments Date Time Provider Department Center  01/05/2017 10:40 AM Hurshel Party, CNM [facility]   Follow up Visit:No Follow-up on file.  Postpartum contraception: Tubal Ligation  Newborn Data: Live born female  Birth Weight: 7 lb 12 oz (3515 g) APGAR: 8,  9  Baby Feeding: Breast Disposition:home with mother   11/28/2016 Renne Musca, MD  CNM attestation I have seen and examined this patient and agree with above documentation in the resident's note.   Brilee Port is a 32 y.o. [id number] s/p VBAC and PPBTL.   Pain is well controlled.  Plan for birth control is bilateral tubal ligation.  Method of Feeding: breast  PE:  BP 124/81 (BP Location: Right Arm)   Pulse 63   Temp 98.4 F (36.9 C)   Resp 18   Ht 5\' 3"  (1.6 m)   Wt 117.9 kg (260 lb)   LMP 01/31/2016   SpO2 100%   Breastfeeding? Unknown   BMI 46.06 kg/m  [employer]: CDI  No results for input(s): HGB, HCT in the last 72 hours.   Plan: discharge today - postpartum care discussed - f/u clinic in 4-6 weeks for postpartum visit   Amarius Toto, CNM 11:19 PM

## 2016-11-30 ENCOUNTER — Encounter (HOSPITAL_COMMUNITY): Payer: Self-pay

## 2017-01-05 ENCOUNTER — Ambulatory Visit: Payer: Self-pay | Admitting: Advanced Practice Midwife

## 2017-03-26 NOTE — Addendum Note (Signed)
Addendum  created 03/26/17 0951 by Guilianna Mckoy D, MD   Sign clinical note    

## 2017-09-20 ENCOUNTER — Other Ambulatory Visit: Payer: Self-pay

## 2017-09-20 ENCOUNTER — Encounter (HOSPITAL_COMMUNITY): Payer: Self-pay | Admitting: Emergency Medicine

## 2017-09-20 DIAGNOSIS — N72 Inflammatory disease of cervix uteri: Secondary | ICD-10-CM | POA: Insufficient documentation

## 2017-09-20 DIAGNOSIS — F17210 Nicotine dependence, cigarettes, uncomplicated: Secondary | ICD-10-CM | POA: Insufficient documentation

## 2017-09-20 DIAGNOSIS — Z79899 Other long term (current) drug therapy: Secondary | ICD-10-CM | POA: Insufficient documentation

## 2017-09-20 DIAGNOSIS — Z8619 Personal history of other infectious and parasitic diseases: Secondary | ICD-10-CM | POA: Insufficient documentation

## 2017-09-20 LAB — URINALYSIS, ROUTINE W REFLEX MICROSCOPIC
BACTERIA UA: NONE SEEN
Bilirubin Urine: NEGATIVE
GLUCOSE, UA: NEGATIVE mg/dL
HGB URINE DIPSTICK: NEGATIVE
KETONES UR: NEGATIVE mg/dL
NITRITE: NEGATIVE
PROTEIN: NEGATIVE mg/dL
Specific Gravity, Urine: 1.018 (ref 1.005–1.030)
pH: 6 (ref 5.0–8.0)

## 2017-09-20 LAB — PREGNANCY, URINE: PREG TEST UR: NEGATIVE

## 2017-09-20 NOTE — ED Triage Notes (Signed)
Pt reports having cramping and clear vaginal discharge. Pt reports having unprotected sex and concerned for STD. Pt reports last period 09/13/17 through 09/17/17. Pt states discharge began after her period ended. Denies any foul smell or burning.

## 2017-09-21 ENCOUNTER — Emergency Department (HOSPITAL_COMMUNITY)
Admission: EM | Admit: 2017-09-21 | Discharge: 2017-09-21 | Disposition: A | Discharge status: Home / Self Care | Payer: Self-pay | Attending: Emergency Medicine | Admitting: Emergency Medicine

## 2017-09-21 DIAGNOSIS — N72 Inflammatory disease of cervix uteri: Secondary | ICD-10-CM

## 2017-09-21 LAB — WET PREP, GENITAL
CLUE CELLS WET PREP: NONE SEEN
SPERM: NONE SEEN
TRICH WET PREP: NONE SEEN
Yeast Wet Prep HPF POC: NONE SEEN

## 2017-09-21 MED ORDER — LIDOCAINE HCL (PF) 1 % IJ SOLN
INTRAMUSCULAR | Status: AC
  Administered 2017-09-21: 0.9 mL
  Filled 2017-09-21: qty 5

## 2017-09-21 MED ORDER — AZITHROMYCIN 250 MG PO TABS
1000.0000 mg | ORAL_TABLET | Freq: Once | ORAL | Status: AC
  Administered 2017-09-21: 1000 mg via ORAL
  Filled 2017-09-21: qty 4

## 2017-09-21 MED ORDER — CEFTRIAXONE SODIUM 250 MG IJ SOLR
250.0000 mg | Freq: Once | INTRAMUSCULAR | Status: AC
  Administered 2017-09-21: 250 mg via INTRAMUSCULAR
  Filled 2017-09-21: qty 250

## 2017-09-21 NOTE — ED Provider Notes (Signed)
[facility]-EMERGENCY DEPT Provider Note   CSN: [id number] Arrival date & time: 09/20/17  2156     History   Chief Complaint Chief Complaint  Patient presents with  . Vaginal Discharge    HPI Carla Carson is a 31 y.o. female.  HPI 31 year old female presents the emergency department after unprotected sex and now having new vaginal discharge.  Denies significant abdominal pain.  No fevers or chills.  Denies low back pain.  Patient does have a history of prior STDs.  Symptoms are mild in severity   Past Medical History:  Diagnosis Date  . Chlamydia   . Gonorrhea   . Infection    UTI  . Ovarian cyst   . Preeclampsia   . Syphilis     Patient Active Problem List   Diagnosis Date Noted  . Labor and delivery, indication for care 11/25/2016  . Abdominal trauma 11/11/2016  . Mental disorder 10/12/2016  . Pregnancy, supervision, high-risk 07/16/2016  . History of syphilis 07/16/2016  . History of cesarean delivery 07/16/2016  . Bipolar disease during pregnancy (HCC) 07/16/2016  . BMI 45.0-49.9, adult (HCC) 07/16/2016  . Obesity in pregnancy 07/16/2016  . GBS (group B Streptococcus carrier), +RV culture, currently pregnant 07/16/2016  . History of prior pregnancy with SGA newborn 07/16/2016  . History of pre-eclampsia 11/07/2015    Past Surgical History:  Procedure Laterality Date  . CESAREAN SECTION N/A 11/07/2015   Procedure: CESAREAN SECTION;  Surgeon: Willodean Rosenthalarolyn Harraway-Smith, MD;  Location: [facility];  Service: Obstetrics;  Laterality: N/A;  . TUBAL LIGATION Bilateral 11/26/2016   Procedure: POST PARTUM TUBAL LIGATION;  Surgeon: Hermina StaggersMichael L Ervin, MD;  Location: [facility];  Service: Gynecology;  Laterality: Bilateral;  . WISDOM TOOTH EXTRACTION      OB History    Gravida Para Term Preterm AB Living   5 5 4 1  0 4   SAB TAB Ectopic Multiple Live Births   0 0 0 0 4       Home Medications    Prior to Admission medications   Medication Sig Start  Date End Date Taking? Authorizing Provider  docusate sodium (COLACE) 100 MG capsule Take 1 capsule (100 mg total) by mouth 2 (two) times daily. 11/28/16   Renne MuscaWarden, Daniel L, MD  ibuprofen (ADVIL,MOTRIN) 600 MG tablet Take 1 tablet (600 mg total) by mouth every 6 (six) hours as needed for mild pain or moderate pain. 11/28/16   Renne MuscaWarden, Daniel L, MD  oxyCODONE (OXY IR/ROXICODONE) 5 MG immediate release tablet Take 1 tablet (5 mg total) by mouth every 6 (six) hours as needed for severe pain. 11/28/16   Renne MuscaWarden, Daniel L, MD    Family History Family History  Problem Relation Age of Onset  . Heart disease Mother        hole in heart  . Cancer Maternal Grandmother     Social History Social History   Tobacco Use  . Smoking status: Current Every Day Smoker    Packs/day: 0.25  . Smokeless tobacco: Never Used  Substance Use Topics  . Alcohol use: No  . Drug use: Yes    Types: Marijuana    Comment: once in a blue moon     Allergies   Nubain [nalbuphine hcl]   Review of Systems Review of Systems  All other systems reviewed and are negative.    Physical Exam Updated Vital Signs BP 101/83   Pulse 84   Temp 98.8 F (37.1 C) (Oral)  Resp 18   Ht 5\' 3"  (1.6 m)   Wt 110.2 kg (243 lb)   LMP 09/13/2017 (Exact Date)   SpO2 100%   BMI 43.05 kg/m   Physical Exam  Constitutional: She is oriented to person, place, and time. She appears well-developed and well-nourished.  HENT:  Head: Normocephalic.  Eyes: EOM are normal.  Neck: Normal range of motion.  Pulmonary/Chest: Effort normal.  Abdominal: She exhibits no distension.  Genitourinary:  Genitourinary Comments: Chaperone present.  Normal external genitalia.  Thick cervical discharge.  Mild cervical motion tenderness.  Musculoskeletal: Normal range of motion.  Neurological: She is alert and oriented to person, place, and time.  Psychiatric: She has a normal mood and affect.  Nursing note and vitals reviewed.    ED Treatments /  Results  Labs (all labs ordered are listed, but only abnormal results are displayed) Labs Reviewed  WET PREP, GENITAL - Abnormal; Notable for the following components:      Result Value   WBC, Wet Prep HPF POC MANY (*)    All other components within normal limits  URINALYSIS, ROUTINE W REFLEX MICROSCOPIC - Abnormal; Notable for the following components:   Leukocytes, UA MODERATE (*)    Squamous Epithelial / LPF 0-5 (*)    All other components within normal limits  PREGNANCY, URINE  POC URINE PREG, ED  GC/CHLAMYDIA PROBE AMP ([place]) NOT AT [facility]    EKG  EKG Interpretation None       Radiology No results found.  Procedures Procedures (including critical care time)  Medications Ordered in ED Medications  cefTRIAXone (ROCEPHIN) injection 250 mg (not administered)  azithromycin (ZITHROMAX) tablet 1,000 mg (not administered)     Initial Impression / Assessment and Plan / ED Course  I have reviewed the triage vital signs and the nursing notes.  Pertinent labs & imaging results that were available during my care of the patient were reviewed by me and considered in my medical decision making (see chart for details).     Patient be treated for acute cervicitis.  Recommended all sexual partners be seen and evaluated at the health department.  Overall well-appearing.  Vitals stable.  Doubt PID.  Final Clinical Impressions(s) / ED Diagnoses   Final diagnoses:  Acute cervicitis    ED Discharge Orders    None       Azalia Bilisampos, Amberlyn Martinezgarcia, MD 09/21/17 0139

## 2017-09-21 NOTE — Discharge Instructions (Addendum)
[email or web address]  Please have all sexual partners seen and evaluated at the health department

## 2017-09-22 LAB — GC/CHLAMYDIA PROBE AMP (~~LOC~~) NOT AT ARMC
CHLAMYDIA, DNA PROBE: NEGATIVE
NEISSERIA GONORRHEA: POSITIVE — AB

## 2017-11-29 IMAGING — US US MFM OB FOLLOW-UP
1 series · 13 of 28 positions shown · non-contrast
Comparison: none

[Series 1: us mfm ob follow-up · 50 acquisitions, 13 frames shown]
[im 2/50]
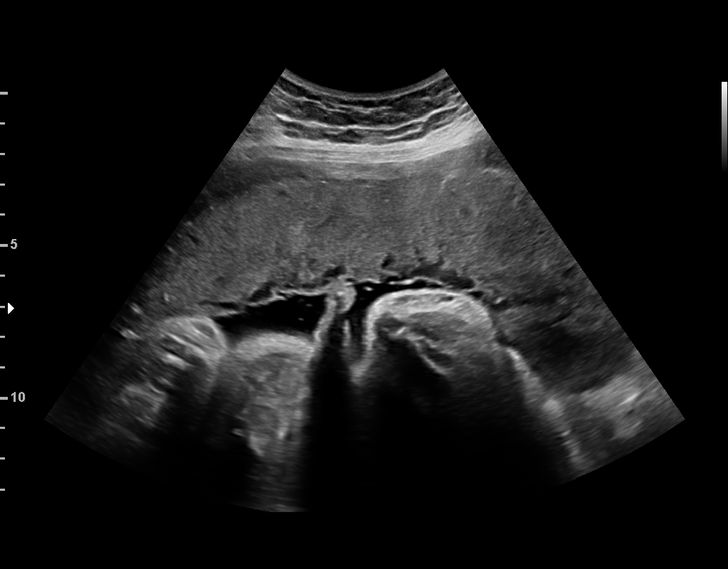
[im 6/50]
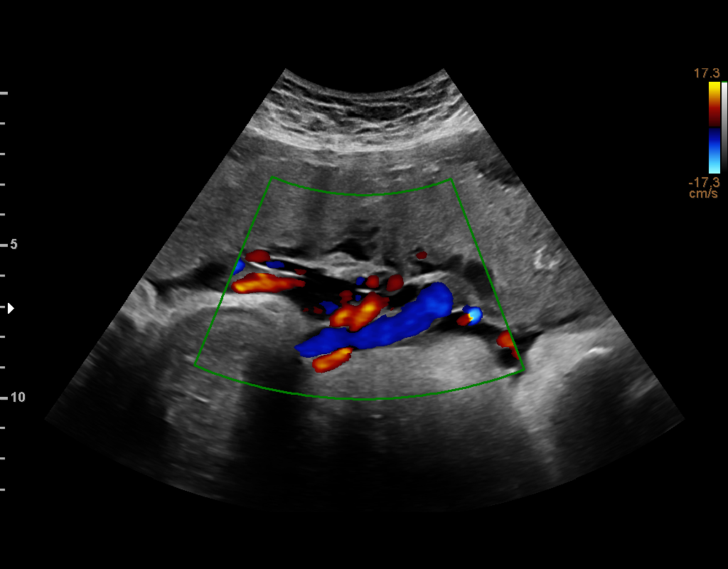
[im 10/50]
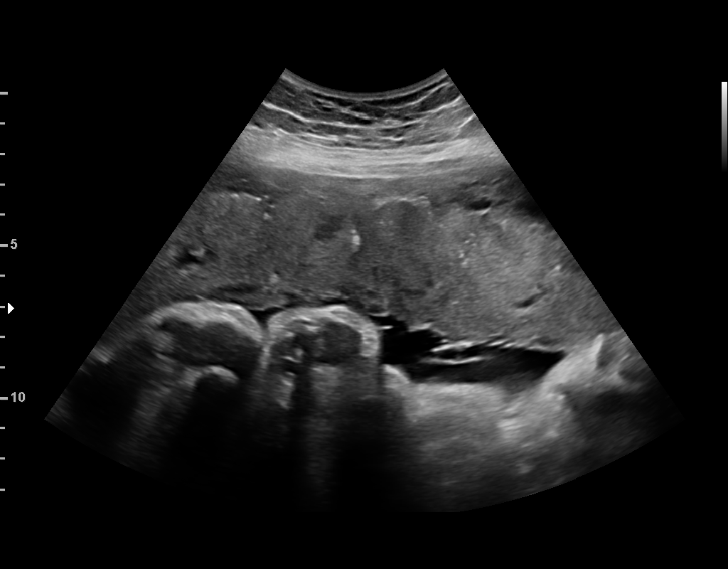
[im 13/50]
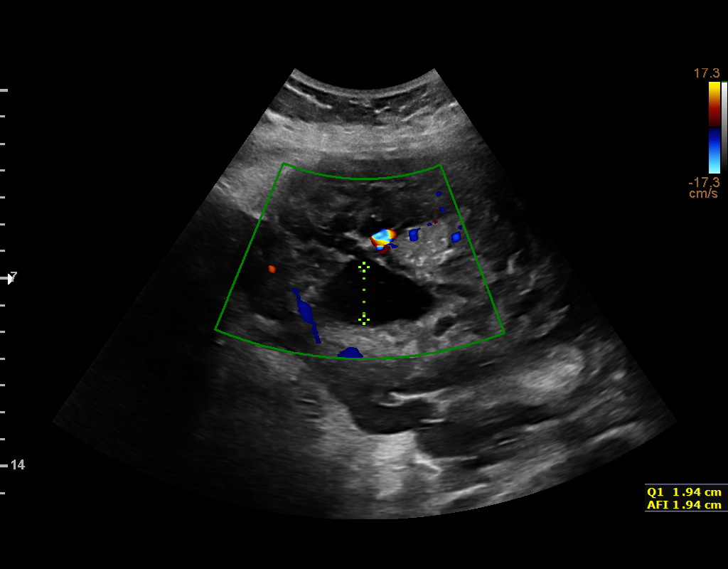
[im 17/50]
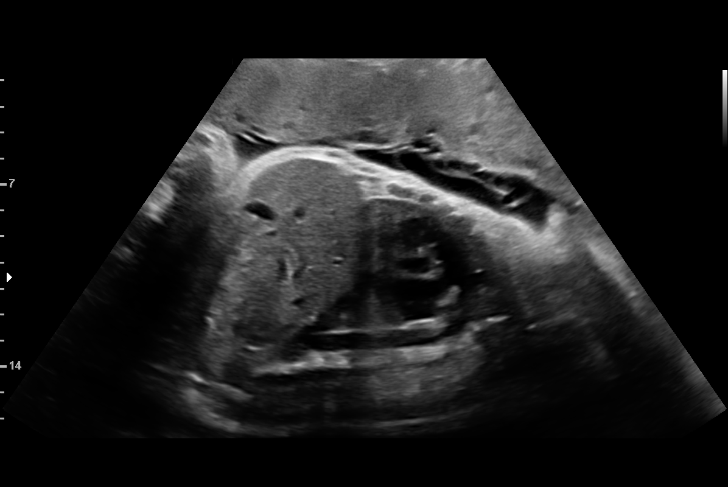
[im 20/50]
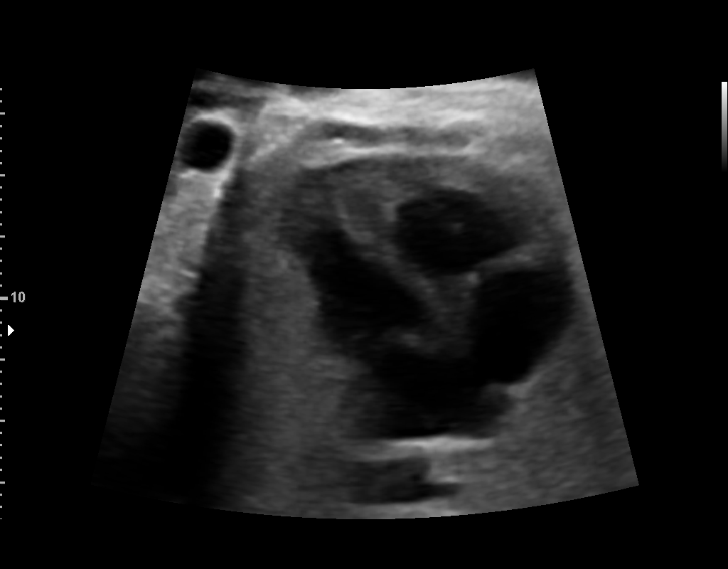
[im 26/50]
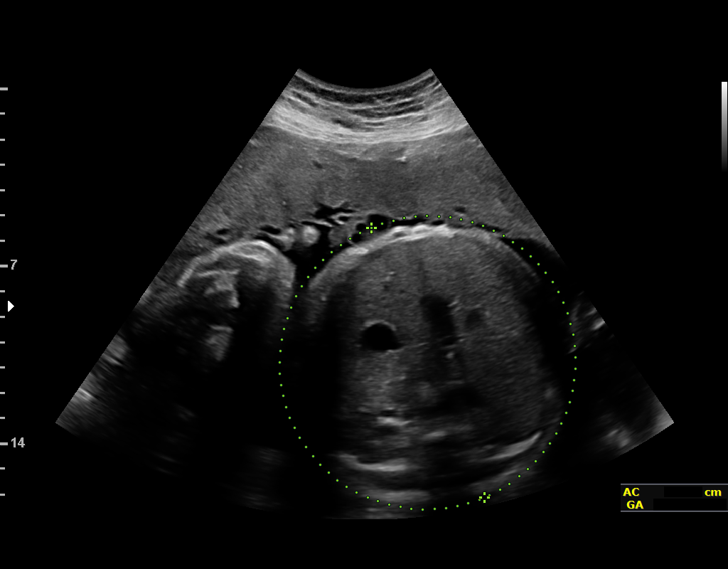
[im 30/50]
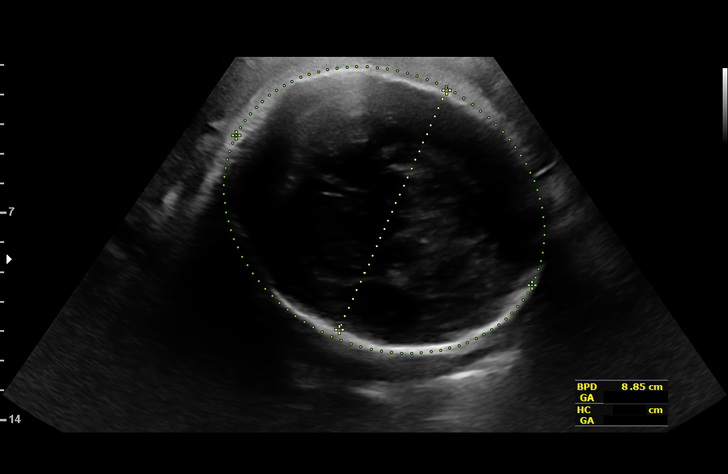
[im 33/50]
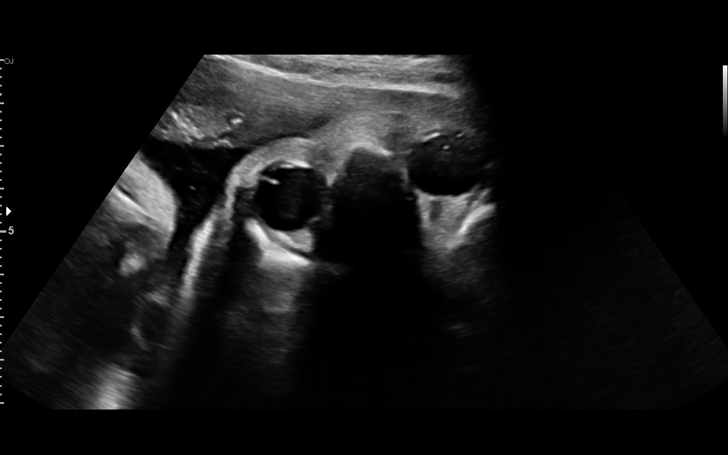
[im 37/50]
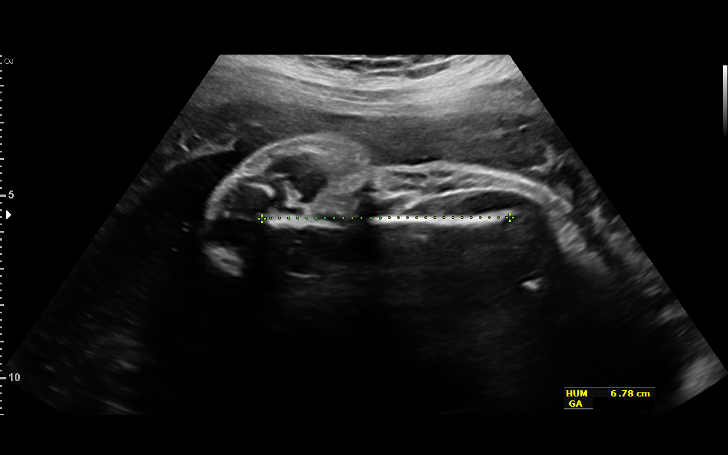
[im 40/50]
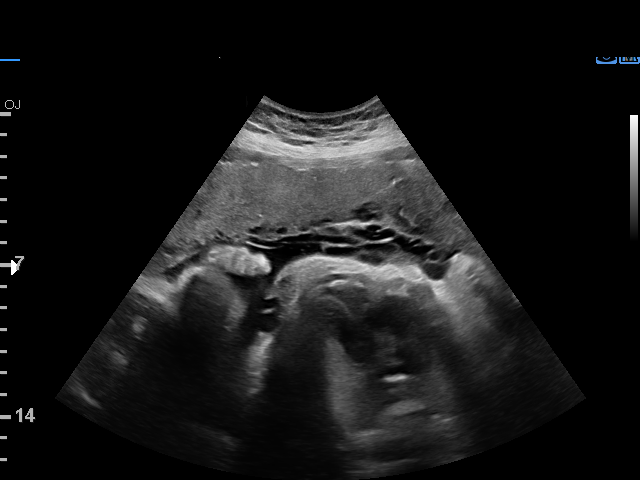
[im 44/50]
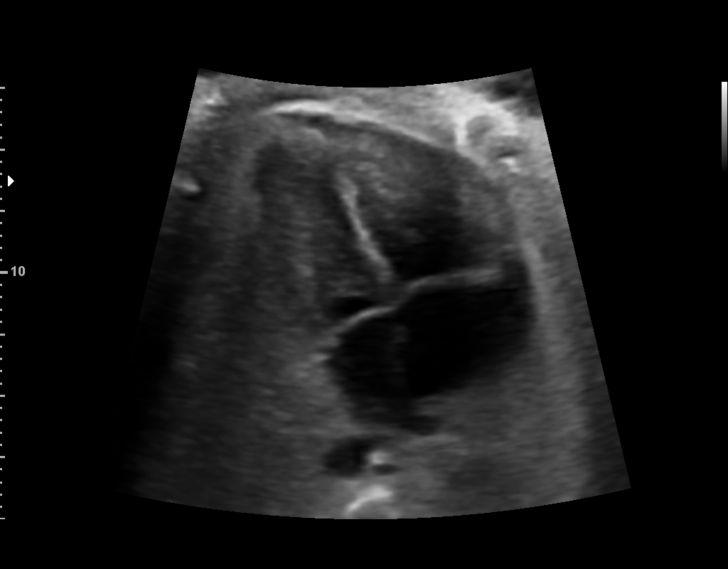
[im 48/50]
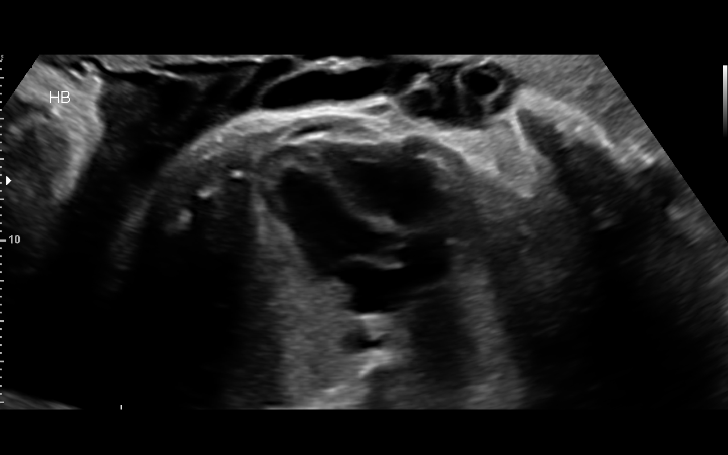

[13 of 28 positions shown; findings below may reference images not displayed]

1  [facility]         [id number]      [id number]     [id number]
Indications

38 weeks gestation of pregnancy
Previous cesarean delivery, antepartum
Obesity complicating pregnancy, third
trimester
Short interval between pregancies, 3rd
trimester
Poor obstetric history: Previous fetal growth
restriction (FGR/SGA)
Poor obstetric history: Previous
preeclampsia / eclampsia/gestational HTN
OB History

Gravidity:    4         Term:   3        Prem:   0        SAB:   0
TOP:          0       Ectopic:  0        Living: 3
Fetal Evaluation

Num Of Fetuses:     1
Fetal Heart         145
Rate(bpm):
Cardiac Activity:   Observed
Presentation:       Cephalic
Placenta:           Anterior, above cervical os
P. Cord Insertion:  Previously Visualized

Amniotic Fluid
AFI FV:      Subjectively within normal limits

AFI Sum(cm)     %Tile       Largest Pocket(cm)
12.73           47
RUQ(cm)       RLQ(cm)       LUQ(cm)        LLQ(cm)
1.94
Biometry

BPD:      88.1  mm     G. Age:  35w 4d         10  %    CI:        75.51   %   70 - 86
FL/HC:      23.3   %   20.9 -
HC:      321.5  mm     G. Age:  36w 2d          4  %    HC/AC:      0.89       0.92 -
AC:      361.1  mm     G. Age:  40w 0d         96  %    FL/BPD:     85.0   %   71 - 87
FL:       74.9  mm     G. Age:  38w 2d         54  %    FL/AC:      20.7   %   20 - 24
HUM:      67.7  mm     G. Age:  39w 3d       > 95  %

Est. FW:    3146  gm    7 lb 13 oz      85  %
Gestational Age

LMP:           42w 0d       Date:   01/31/16                 EDD:   11/06/16
U/S Today:     37w 4d                                        EDD:   12/07/16
Best:          38w 2d    Det. By:   U/S  (07/27/16)          EDD:   12/02/16
Anatomy

Cranium:               Appears normal         Aortic Arch:            Appears normal
Cavum:                 Previously seen        Ductal Arch:            Previously seen
Ventricles:            Appears normal         Diaphragm:              Previously seen
Choroid Plexus:        Previously seen        Stomach:                Appears normal, left
sided
Cerebellum:            Previously seen        Abdomen:                Appears normal
Posterior Fossa:       Previously seen        Abdominal Wall:         Previously seen
Nuchal Fold:           Not applicable (>20    Cord Vessels:           Appears normal (3
wks GA)                                        vessel cord)
Face:                  Orbits and profile     Kidneys:                Appear normal
previously seen
Lips:                  Previously seen        Bladder:                Appears normal
Thoracic:              Appears normal         Spine:                  Limited views
previously seen
Heart:                 Appears normal         Upper Extremities:      Previously seen
(4CH, axis, and situs
RVOT:                  Appears normal         Lower Extremities:      Previously seen
LVOT:                  Appears normal

Other:  Fetus appears to be a male. Heels previously visualized. Left 5th digit
previously visualized. Technically difficult due to fetal position.
Cervix Uterus Adnexa

Cervix
Not visualized (advanced GA >98wks)

Uterus
No abnormality visualized.

Left Ovary
Not visualized. No adnexal mass visualized.

Right Ovary
Not visualized. No adnexal mass visualized.
Impression

Singleton intrauterine pregnancy at 28+1 weeks, here for
poor obstetrical history
Review of the anatomy shows no sonographic markers for
aneuploidy or structural anomalies.
Amniotic fluid volume is normal with an AFI of 12.7 cm
Estimated fetal weight is 3546g which is growth in the 85th
percentile
Recommendations

Follow-up ultrasounds as clinically indicated.

## 2018-05-01 ENCOUNTER — Encounter (HOSPITAL_COMMUNITY): Payer: Self-pay | Admitting: Emergency Medicine

## 2018-05-01 ENCOUNTER — Emergency Department (HOSPITAL_COMMUNITY)
Admission: EM | Admit: 2018-05-01 | Discharge: 2018-05-01 | Disposition: A | Discharge status: Home / Self Care | Payer: Self-pay | Attending: Emergency Medicine | Admitting: Emergency Medicine

## 2018-05-01 DIAGNOSIS — K0889 Other specified disorders of teeth and supporting structures: Secondary | ICD-10-CM | POA: Insufficient documentation

## 2018-05-01 DIAGNOSIS — F17210 Nicotine dependence, cigarettes, uncomplicated: Secondary | ICD-10-CM | POA: Insufficient documentation

## 2018-05-01 DIAGNOSIS — Z79899 Other long term (current) drug therapy: Secondary | ICD-10-CM | POA: Insufficient documentation

## 2018-05-01 MED ORDER — LIDOCAINE VISCOUS HCL 2 % MT SOLN: 15.0000 mL | OROMUCOSAL | 2 refills | Status: DC | PRN

## 2018-05-01 MED ORDER — BUPIVACAINE-EPINEPHRINE (PF) 0.5% -1:200000 IJ SOLN
1.8000 mL | Freq: Once | INTRAMUSCULAR | Status: AC
  Administered 2018-05-01: 1.8 mL
  Filled 2018-05-01: qty 1.8

## 2018-05-01 MED ORDER — PENICILLIN V POTASSIUM 500 MG PO TABS: 500.0000 mg | ORAL_TABLET | Freq: Four times a day (QID) | ORAL | 0 refills | Status: AC

## 2018-05-01 NOTE — ED Notes (Signed)
Pt resting on benches. Pt stated she took melatonin and does not want to be missed if she falls asleep. Pt near nurse first desk

## 2018-05-01 NOTE — ED Provider Notes (Addendum)
[facility] EMERGENCY DEPARTMENT Provider Note   CSN: [id number] Arrival date & time: 05/01/18  1240     History   Chief Complaint Chief Complaint  Patient presents with  . Dental Pain    HPI Carla Carson is a 32 y.o. female.  HPI   Carla Carson is a 32 y.o. female, patient with no pertinent past medical history, presenting to the ED with left-sided dental pain beginning last night.  Pain is aching, moderate to severe, radiating to the left ear.  She has taken melatonin for the pain with no improvement.  Denies swelling, fever/chills, nausea/vomiting, difficulty breathing or swallowing, drooling, or any other complaints.     Past Medical History:  Diagnosis Date  . Chlamydia   . Gonorrhea   . Infection    UTI  . Ovarian cyst   . Preeclampsia   . Syphilis     Patient Active Problem List   Diagnosis Date Noted  . Labor and delivery, indication for care 11/25/2016  . Abdominal trauma 11/11/2016  . Mental disorder 10/12/2016  . Pregnancy, supervision, high-risk 07/16/2016  . History of syphilis 07/16/2016  . History of cesarean delivery 07/16/2016  . Bipolar disease during pregnancy (HCC) 07/16/2016  . BMI 45.0-49.9, adult (HCC) 07/16/2016  . Obesity in pregnancy 07/16/2016  . GBS (group B Streptococcus carrier), +RV culture, currently pregnant 07/16/2016  . History of prior pregnancy with SGA newborn 07/16/2016  . History of pre-eclampsia 11/07/2015    Past Surgical History:  Procedure Laterality Date  . CESAREAN SECTION N/A 11/07/2015   Procedure: CESAREAN SECTION;  Surgeon: Willodean Rosenthal, MD;  Location: [facility];  Service: Obstetrics;  Laterality: N/A;  . TUBAL LIGATION Bilateral 11/26/2016   Procedure: POST PARTUM TUBAL LIGATION;  Surgeon: Hermina Staggers, MD;  Location: [facility];  Service: Gynecology;  Laterality: Bilateral;  . WISDOM TOOTH EXTRACTION       OB History    Gravida  5   Para  5   Term  4   Preterm  1   AB  0    Living  4     SAB  0   TAB  0   Ectopic  0   Multiple  0   Live Births  4            Home Medications    Prior to Admission medications   Medication Sig Start Date End Date Taking? Authorizing Provider  acetaminophen (TYLENOL) 500 MG tablet Take 1,000 mg by mouth every 6 (six) hours as needed.   Yes [provider]  MELATONIN PO Take 1 tablet by mouth at bedtime.   Yes [provider]  docusate sodium (COLACE) 100 MG capsule Take 1 capsule (100 mg total) by mouth 2 (two) times daily. Patient not taking: Reported on 05/01/2018 11/28/16   Renne Musca, MD  ibuprofen (ADVIL,MOTRIN) 600 MG tablet Take 1 tablet (600 mg total) by mouth every 6 (six) hours as needed for mild pain or moderate pain. Patient not taking: Reported on 05/01/2018 11/28/16   Renne Musca, MD  lidocaine (XYLOCAINE) 2 % solution Use as directed 15 mLs in the mouth or throat as needed for mouth pain. 05/01/18   Joy, Shawn C, PA-C  oxyCODONE (OXY IR/ROXICODONE) 5 MG immediate release tablet Take 1 tablet (5 mg total) by mouth every 6 (six) hours as needed for severe pain. Patient not taking: Reported on 05/01/2018 11/28/16   Renne Musca, MD  penicillin  v potassium (VEETID) 500 MG tablet Take 1 tablet (500 mg total) by mouth 4 (four) times daily for 7 days. 05/01/18 05/08/18  Anselm PancoastJoy, Shawn C, PA-C    Family History Family History  Problem Relation Age of Onset  . Heart disease Mother        hole in heart  . Cancer Maternal Grandmother     Social History Social History   Tobacco Use  . Smoking status: Current Every Day Smoker    Packs/day: 0.10  . Smokeless tobacco: Never Used  Substance Use Topics  . Alcohol use: No  . Drug use: Yes    Types: Marijuana    Comment: once in a blue moon     Allergies   Nubain [nalbuphine hcl]   Review of Systems Review of Systems  Constitutional: Negative for chills and fever.  HENT: Positive for dental problem. Negative for drooling, facial  swelling and trouble swallowing.   Respiratory: Negative for shortness of breath.   Gastrointestinal: Negative for nausea and vomiting.     Physical Exam Updated Vital Signs BP 123/90 (BP Location: Right Arm)   Pulse 75   Temp 98.6 F (37 C) (Oral)   Resp 18   LMP 05/01/2018   SpO2 100%   Physical Exam  Constitutional: She appears well-developed and well-nourished. No distress.  HENT:  Head: Normocephalic and atraumatic.  Right Ear: Tympanic membrane, external ear and ear canal normal.  Left Ear: Tympanic membrane, external ear and ear canal normal.  Poor dentition throughout.  Tenderness in the area of the remaining left maxillary molar.  No noted area of swelling or fluctuance.  No trismus.  Mouth opening to at least 3 finger widths.  Handles oral secretions without difficulty.  No noted facial swelling.  No swelling or tenderness to the submental or submandibular regions.  No swelling or tenderness into the soft tissues of the neck.  Eyes: Conjunctivae are normal.  Neck: Normal range of motion. Neck supple.  Cardiovascular: Normal rate and regular rhythm.  Pulmonary/Chest: Effort normal.  Lymphadenopathy:    She has no cervical adenopathy.  Neurological: She is alert.  Skin: Skin is warm and dry. She is not diaphoretic. No pallor.  Psychiatric: She has a normal mood and affect. Her behavior is normal.  Nursing note and vitals reviewed.    ED Treatments / Results  Labs (all labs ordered are listed, but only abnormal results are displayed) Labs Reviewed - No data to display  EKG None  Radiology No results found.  Procedures Dental Block Date/Time: 05/01/2018 3:07 PM Performed by: Anselm PancoastJoy, Shawn C, PA-C Authorized by: Anselm PancoastJoy, Shawn C, PA-C   Consent:    Consent obtained:  Verbal   Consent given by:  Patient   Risks discussed:  Swelling, unsuccessful block and pain Indications:    Indications: dental pain   Location:    Block type:  Middle superior alveolar    Laterality:  Left Procedure details (see MAR for exact dosages):    Topical anesthetic:  Benzocaine gel   Syringe type:  Controlled syringe   Needle gauge:  27 G   Anesthetic injected:  Bupivacaine 0.5% WITH epi   Injection procedure:  Anatomic landmarks identified, anatomic landmarks palpated, introduced needle, negative aspiration for blood and incremental injection Post-procedure details:    Outcome:  Anesthesia achieved   Patient tolerance of procedure:  Tolerated well, no immediate complications   (including critical care time)  Medications Ordered in ED Medications  bupivacaine-epinephrine (MARCAINE W/ EPI)  0.5% -1:200000 injection 1.8 mL (1.8 mLs Infiltration Given 05/01/18 1430)     Initial Impression / Assessment and Plan / ED Course  I have reviewed the triage vital signs and the nursing notes.  Pertinent labs & imaging results that were available during my care of the patient were reviewed by me and considered in my medical decision making (see chart for details).     Patient presents with left upper dental pain.  Doubt sepsis or Ludwig's angioedema.  Recommend dentist follow-up. The patient was given instructions for home care as well as return precautions. Patient voices understanding of these instructions, accepts the plan, and is comfortable with discharge.  Final Clinical Impressions(s) / ED Diagnoses   Final diagnoses:  Pain, dental    ED Discharge Orders        Ordered    lidocaine (XYLOCAINE) 2 % solution  As needed     05/01/18 1510    penicillin v potassium (VEETID) 500 MG tablet  4 times daily     05/01/18 1510       Anselm Pancoast, PA-C 05/01/18 1554    Joy, Hillard Danker, PA-C 05/01/18 1555    Margarita Grizzle, MD 05/01/18 1650

## 2018-05-01 NOTE — ED Notes (Signed)
Called for room x2 with no response. 

## 2018-05-01 NOTE — Discharge Instructions (Addendum)
°  Dental Pain °You have been seen today for dental pain. You should follow up with a dentist as soon as possible. This problem will not resolve on its own without the care of a dentist. Use ibuprofen or naproxen for pain. Use the viscous lidocaine for mouth pain. Swish with the lidocaine and spit it out. Do not swallow it. ° °Please take all of your antibiotics until finished!   You may develop abdominal discomfort or diarrhea from the antibiotic.  You may help offset this with probiotics which you can buy or get in yogurt. Do not eat or take the probiotics until 2 hours after your antibiotic.  ° °Antiinflammatory medications: Take 600 mg of ibuprofen every 6 hours or 440 mg (over the counter dose) to 500 mg (prescription dose) of naproxen every 12 hours for the next 3 days. After this time, these medications may be used as needed for pain. Take these medications with food to avoid upset stomach. Choose only one of these medications, do not take them together. °Tylenol: Should you continue to have additional pain while taking the ibuprofen or naproxen, you may add in tylenol as needed. Your daily total maximum amount of tylenol from all sources should be limited to 4000mg/day for persons without liver problems, or 2000mg/day for those with liver problems. ° °Dental Resource Guide ° °[facility] °[address], [address] °[place], [place] [address] °[phone] ° °[facility] °[address] °[place], [place] [address] °[phone] ° °[facility] °[address] °[place], [place] [address] °[phone] ° °[facility] °[address] °[place], [place] [address] °[phone] ° °[facility] °[address] °[place], [place] [address] °[phone] ° °[employer] °Www.[facility].[employer].edu/patientcare/studentclinics/becomepatient ° °[employer] [facility] °[address] °[place], [place] [address] °[phone] ° °Website for free, low-income, or sliding scale dental services in [place]: °www.freedental.us ° °To find a dentist in [place] and surrounding areas: °www.ncdental.org/for-the-public/find-a-dentist ° °[facility] °http://www.ncdental.org/meetings-events/-[facility] ° ° Medicaid Dentist °https://dma.ncdhhs.gov/find-a-doctor/medicaid-dental-providers ° °

## 2018-05-01 NOTE — ED Notes (Signed)
Pt verbalized understanding discharge instructions and denies any further needs or questions at this time. VS stable, ambulatory and steady gait.   See EDP assessment / note.   

## 2018-05-01 NOTE — ED Triage Notes (Signed)
Pt presents to ED for assessment of left sided dental and ear pain starting last night.  Patient states she took melatonin and tylenol at home for the pain, patient is sleepy in triage.  Patient has a pack of peanut m&ms and yoohoo in triage.  Patient has known hx of need for dental work to her left side of her mouth.  States she does not currently have a Education officer, communitydentist.

## 2018-05-01 NOTE — ED Notes (Signed)
Called for room x 1.  

## 2018-05-23 ENCOUNTER — Other Ambulatory Visit: Payer: Self-pay

## 2018-05-23 ENCOUNTER — Encounter (HOSPITAL_COMMUNITY): Payer: Self-pay

## 2018-05-23 ENCOUNTER — Emergency Department (HOSPITAL_COMMUNITY)
Admission: EM | Admit: 2018-05-23 | Discharge: 2018-05-23 | Disposition: A | Discharge status: Home / Self Care | Payer: Self-pay | Attending: Emergency Medicine | Admitting: Emergency Medicine

## 2018-05-23 DIAGNOSIS — N760 Acute vaginitis: Secondary | ICD-10-CM | POA: Insufficient documentation

## 2018-05-23 DIAGNOSIS — B9689 Other specified bacterial agents as the cause of diseases classified elsewhere: Secondary | ICD-10-CM | POA: Insufficient documentation

## 2018-05-23 DIAGNOSIS — F17200 Nicotine dependence, unspecified, uncomplicated: Secondary | ICD-10-CM | POA: Insufficient documentation

## 2018-05-23 DIAGNOSIS — N946 Dysmenorrhea, unspecified: Secondary | ICD-10-CM | POA: Insufficient documentation

## 2018-05-23 LAB — CBC WITH DIFFERENTIAL/PLATELET
BASOS ABS: 0 10*3/uL (ref 0.0–0.1)
BASOS PCT: 0 %
Eosinophils Absolute: 0.1 10*3/uL (ref 0.0–0.7)
Eosinophils Relative: 1 %
HCT: 36.4 % (ref 36.0–46.0)
Hemoglobin: 11.7 g/dL — ABNORMAL LOW (ref 12.0–15.0)
Lymphocytes Relative: 36 %
Lymphs Abs: 4.3 10*3/uL — ABNORMAL HIGH (ref 0.7–4.0)
MCH: 26.8 pg (ref 26.0–34.0)
MCHC: 32.1 g/dL (ref 30.0–36.0)
MCV: 83.3 fL (ref 78.0–100.0)
MONO ABS: 1.1 10*3/uL — AB (ref 0.1–1.0)
MONOS PCT: 9 %
Neutro Abs: 6.4 10*3/uL (ref 1.7–7.7)
Neutrophils Relative %: 54 %
Platelets: 477 10*3/uL — ABNORMAL HIGH (ref 150–400)
RBC: 4.37 MIL/uL (ref 3.87–5.11)
RDW: 15.2 % (ref 11.5–15.5)
WBC: 11.9 10*3/uL — ABNORMAL HIGH (ref 4.0–10.5)

## 2018-05-23 LAB — URINALYSIS, ROUTINE W REFLEX MICROSCOPIC
Bacteria, UA: NONE SEEN
Bilirubin Urine: NEGATIVE
GLUCOSE, UA: NEGATIVE mg/dL
Ketones, ur: NEGATIVE mg/dL
Leukocytes, UA: NEGATIVE
Nitrite: NEGATIVE
PROTEIN: 100 mg/dL — AB
Specific Gravity, Urine: 1.026 (ref 1.005–1.030)
pH: 6 (ref 5.0–8.0)

## 2018-05-23 LAB — BASIC METABOLIC PANEL
ANION GAP: 8 (ref 5–15)
BUN: 9 mg/dL (ref 6–20)
CO2: 27 mmol/L (ref 22–32)
CREATININE: 0.76 mg/dL (ref 0.44–1.00)
Calcium: 9.1 mg/dL (ref 8.9–10.3)
Chloride: 105 mmol/L (ref 98–111)
GFR calc Af Amer: >60 mL/min (ref >60)
GLUCOSE: 81 mg/dL (ref 70–99)
Potassium: 4.3 mmol/L (ref 3.5–5.1)
Sodium: 140 mmol/L (ref 135–145)

## 2018-05-23 LAB — WET PREP, GENITAL
SPERM: NONE SEEN
Trich, Wet Prep: NONE SEEN
Yeast Wet Prep HPF POC: NONE SEEN

## 2018-05-23 LAB — I-STAT BETA HCG BLOOD, ED (MC, WL, AP ONLY): I-stat hCG, quantitative: <5 m[IU]/mL (ref <5)

## 2018-05-23 MED ORDER — METHOCARBAMOL 500 MG PO TABS: 500.0000 mg | ORAL_TABLET | Freq: Two times a day (BID) | ORAL | 0 refills | Status: DC

## 2018-05-23 MED ORDER — KETOROLAC TROMETHAMINE 30 MG/ML IJ SOLN
30.0000 mg | Freq: Once | INTRAMUSCULAR | Status: AC
  Administered 2018-05-23: 30 mg via INTRAVENOUS
  Filled 2018-05-23: qty 1

## 2018-05-23 MED ORDER — METRONIDAZOLE 500 MG PO TABS: 500.0000 mg | ORAL_TABLET | Freq: Two times a day (BID) | ORAL | 0 refills | Status: DC

## 2018-05-23 MED ORDER — METRONIDAZOLE 500 MG PO TABS
500.0000 mg | ORAL_TABLET | Freq: Once | ORAL | Status: AC
  Administered 2018-05-23: 500 mg via ORAL
  Filled 2018-05-23: qty 1

## 2018-05-23 MED ORDER — SODIUM CHLORIDE 0.9 % IV BOLUS
1000.0000 mL | Freq: Once | INTRAVENOUS | Status: AC
  Administered 2018-05-23: 1000 mL via INTRAVENOUS

## 2018-05-23 MED ORDER — NAPROXEN 500 MG PO TABS: 500.0000 mg | ORAL_TABLET | Freq: Two times a day (BID) | ORAL | 0 refills | Status: DC

## 2018-05-23 NOTE — ED Provider Notes (Signed)
[facility]-EMERGENCY DEPT Provider Note   CSN: [id number] Arrival date & time: 05/23/18  1840     History   Chief Complaint Chief Complaint  Patient presents with  . Back Pain    HPI Carla Carson is a 32 y.o. female with a past medical history of ovarian cysts, who presents to ED for evaluation of back pain that began today and vaginal bleeding since yesterday.  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  No improvement with 1 dose of ibuprofen taken.  She states that she completed her menstrual cycle 3 weeks ago which was "less blood than usual."  States that she had onset of bleeding again since yesterday and is similar to her usual menstrual cycle.  She denies any injuries or falls, urinary symptoms.  She does report nausea and several episodes of diarrhea.  Denies any numbness in arms or legs, fever, hematemesis, hematochezia or melena, blood thinner use, lightheadedness, chest pain, shortness of breath.  HPI  Past Medical History:  Diagnosis Date  . Chlamydia   . Gonorrhea   . Infection    UTI  . Ovarian cyst   . Preeclampsia   . Syphilis     Patient Active Problem List   Diagnosis Date Noted  . Labor and delivery, indication for care 11/25/2016  . Abdominal trauma 11/11/2016  . Mental disorder 10/12/2016  . Pregnancy, supervision, high-risk 07/16/2016  . History of syphilis 07/16/2016  . History of cesarean delivery 07/16/2016  . Bipolar disease during pregnancy (HCC) 07/16/2016  . BMI 45.0-49.9, adult (HCC) 07/16/2016  . Obesity in pregnancy 07/16/2016  . GBS (group B Streptococcus carrier), +RV culture, currently pregnant 07/16/2016  . History of prior pregnancy with SGA newborn 07/16/2016  . History of pre-eclampsia 11/07/2015    Past Surgical History:  Procedure Laterality Date  . CESAREAN SECTION N/A 11/07/2015   Procedure: CESAREAN SECTION;  Surgeon: Willodean Rosenthalarolyn Harraway-Smith, MD;   Location: [facility];  Service: Obstetrics;  Laterality: N/A;  . TUBAL LIGATION Bilateral 11/26/2016   Procedure: POST PARTUM TUBAL LIGATION;  Surgeon: Hermina StaggersMichael L Ervin, MD;  Location: [facility];  Service: Gynecology;  Laterality: Bilateral;  . WISDOM TOOTH EXTRACTION       OB History    Gravida  5   Para  5   Term  4   Preterm  1   AB  0   Living  4     SAB  0   TAB  0   Ectopic  0   Multiple  0   Live Births  4            Home Medications    Prior to Admission medications   Medication Sig Start Date End Date Taking? Authorizing Provider  ibuprofen (ADVIL,MOTRIN) 200 MG tablet Take 200 mg by mouth every 6 (six) hours as needed for moderate pain.   Yes [provider]  acetaminophen (TYLENOL) 500 MG tablet Take 1,000 mg by mouth every 6 (six) hours as needed for moderate pain.     [provider]  docusate sodium (COLACE) 100 MG capsule Take 1 capsule (100 mg total) by mouth 2 (two) times daily. Patient not taking: Reported on 05/01/2018 11/28/16   Renne MuscaWarden, Daniel L, MD  ibuprofen (ADVIL,MOTRIN) 600 MG tablet Take 1 tablet (600 mg total) by mouth every 6 (six) hours as needed for mild pain or moderate pain. Patient not taking: Reported on  05/01/2018 11/28/16   Renne Musca, MD  lidocaine (XYLOCAINE) 2 % solution Use as directed 15 mLs in the mouth or throat as needed for mouth pain. 05/01/18   Joy, Shawn C, PA-C  methocarbamol (ROBAXIN) 500 MG tablet Take 1 tablet (500 mg total) by mouth 2 (two) times daily. 05/23/18   Shaquela Weichert, PA-C  metroNIDAZOLE (FLAGYL) 500 MG tablet Take 1 tablet (500 mg total) by mouth 2 (two) times daily. 05/23/18   Addasyn Mcbreen, PA-C  naproxen (NAPROSYN) 500 MG tablet Take 1 tablet (500 mg total) by mouth 2 (two) times daily. 05/23/18   Keileigh Vahey, PA-C  oxyCODONE (OXY IR/ROXICODONE) 5 MG immediate release tablet Take 1 tablet (5 mg total) by mouth every 6 (six) hours as needed for severe pain. Patient not taking: Reported on 05/01/2018  11/28/16   Renne Musca, MD    Family History Family History  Problem Relation Age of Onset  . Heart disease Mother        hole in heart  . Cancer Maternal Grandmother     Social History Social History   Tobacco Use  . Smoking status: Current Every Day Smoker    Packs/day: 0.10  . Smokeless tobacco: Never Used  Substance Use Topics  . Alcohol use: No  . Drug use: Yes    Types: Marijuana    Comment: once in a blue moon     Allergies   Nubain [nalbuphine hcl]   Review of Systems Review of Systems  Constitutional: Negative for appetite change, chills and fever.  HENT: Negative for ear pain, rhinorrhea, sneezing and sore throat.   Eyes: Negative for photophobia and visual disturbance.  Respiratory: Negative for cough, chest tightness, shortness of breath and wheezing.   Cardiovascular: Negative for chest pain and palpitations.  Gastrointestinal: Positive for diarrhea and nausea. Negative for abdominal pain, blood in stool, constipation and vomiting.  Genitourinary: Positive for vaginal bleeding and vaginal discharge. Negative for dysuria, hematuria and urgency.  Musculoskeletal: Positive for back pain. Negative for myalgias.  Skin: Negative for rash.  Neurological: Negative for dizziness, weakness and light-headedness.     Physical Exam Updated Vital Signs BP 134/78   Pulse 67   Temp 99.1 F (37.3 C) (Oral)   Resp 15   LMP 05/01/2018   SpO2 98%   Physical Exam  Constitutional: She appears well-developed and well-nourished. No distress.  HENT:  Head: Normocephalic and atraumatic.  Nose: Nose normal.  Eyes: Conjunctivae and EOM are normal. Left eye exhibits no discharge. No scleral icterus.  Neck: Normal range of motion. Neck supple.  Cardiovascular: Normal rate, regular rhythm, normal heart sounds and intact distal pulses. Exam reveals no gallop and no friction rub.  No murmur heard. Pulmonary/Chest: Effort normal and breath sounds normal. No respiratory  distress.  Abdominal: Soft. Bowel sounds are normal. She exhibits no distension. There is no tenderness. There is no guarding.  Genitourinary:  Genitourinary Comments: Pelvic exam: normal external genitalia without evidence of trauma. VULVA: normal appearing vulva with no masses, tenderness or lesion. VAGINA: normal appearing vagina with normal color and discharge, no lesions.  Blood noted in vaginal vault. CERVIX: normal appearing cervix without lesions, cervical motion tenderness absent, cervical os closed with out purulent discharge; No vaginal discharge. Wet prep and DNA probe for chlamydia and GC obtained.   ADNEXA: normal adnexa in size, nontender and no masses UTERUS: uterus is normal size, shape, consistency and nontender.    Musculoskeletal: Normal range of motion. She exhibits  no edema.  No midline spinal tenderness present in lumbar, thoracic or cervical spine. No step-off palpated. No visible bruising, edema or temperature change noted. No objective signs of numbness present. No saddle anesthesia. 2+ DP pulses bilaterally. Sensation intact to light touch. Strength 5/5 in bilateral lower extremities.  Neurological: She is alert. She exhibits normal muscle tone. Coordination normal.  Skin: Skin is warm and dry. No rash noted.  Psychiatric: She has a normal mood and affect.  Nursing note and vitals reviewed.    ED Treatments / Results  Labs (all labs ordered are listed, but only abnormal results are displayed) Labs Reviewed  WET PREP, GENITAL - Abnormal; Notable for the following components:      Result Value   Clue Cells Wet Prep HPF POC PRESENT (*)    WBC, Wet Prep HPF POC FEW (*)    All other components within normal limits  URINALYSIS, ROUTINE W REFLEX MICROSCOPIC - Abnormal; Notable for the following components:   APPearance HAZY (*)    Hgb urine dipstick LARGE (*)    Protein, ur 100 (*)    RBC / HPF >50 (*)    All other components within normal limits  CBC WITH  DIFFERENTIAL/PLATELET - Abnormal; Notable for the following components:   WBC 11.9 (*)    Hemoglobin 11.7 (*)    Platelets 477 (*)    Lymphs Abs 4.3 (*)    Monocytes Absolute 1.1 (*)    All other components within normal limits  BASIC METABOLIC PANEL  I-STAT BETA HCG BLOOD, ED ([facility], [facility], [facility] ONLY)  GC/CHLAMYDIA PROBE AMP ([place]) NOT AT [facility]    EKG None  Radiology No results found.  Procedures Procedures (including critical care time)  Medications Ordered in ED Medications  metroNIDAZOLE (FLAGYL) tablet 500 mg (has no administration in time range)  ketorolac (TORADOL) 30 MG/ML injection 30 mg (30 mg Intravenous Given 05/23/18 2223)  sodium chloride 0.9 % bolus 1,000 mL (1,000 mLs Intravenous New Bag/Given 05/23/18 2222)     Initial Impression / Assessment and Plan / ED Course  I have reviewed the triage vital signs and the nursing notes.  Pertinent labs & imaging results that were available during my care of the patient were reviewed by me and considered in my medical decision making (see chart for details).     32 year old female with a past medical history of ovarian cyst, presents to ED for evaluation of back pain that began today and vaginal bleeding since yesterday.  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  No improvement with 1 dose of ibuprofen.  She completed her normal menstrual cycle 3 weeks ago but described more as spotting.  She had onset of bleeding again today which is similar to her usual menstrual cycle.  Denies any injuries or falls, numbness in legs, fever.  She does report diarrhea and nausea.  On physical exam she is overall well-appearing.  No deficits on neurological exam noted.  Pelvic exam revealed blood in the vaginal vault with no cervical motion tenderness or adnexal tenderness.  Lungs are clear to auscultation bilaterally.  She is not tachycardic, tachypneic or hypoxic.  She denies any chest  pain.  Lab work significant for leukocytosis at 11.9, clue cells present and wet prep.  Suspect that the diarrhea and nausea are viral in origin which is the cause of her leukocytosis.  She was given anti-inflammatories and fluids with significant improvement  in her symptoms.  Will give Flagyl for BV, anti-inflammatories and muscle relaxer to help with back pain.  I doubt cauda equina, dissection or other surgical or emergent cause of symptoms.  Advised to return to ED for any severe worsening symptoms.  Portions of this note were generated with Scientist, clinical (histocompatibility and immunogenetics). Dictation errors may occur despite best attempts at proofreading.   Final Clinical Impressions(s) / ED Diagnoses   Final diagnoses:  Dysmenorrhea  Bacterial vaginitis    ED Discharge Orders        Ordered    metroNIDAZOLE (FLAGYL) 500 MG tablet  2 times daily     05/23/18 2333    naproxen (NAPROSYN) 500 MG tablet  2 times daily     05/23/18 2333    methocarbamol (ROBAXIN) 500 MG tablet  2 times daily     05/23/18 2333       Dietrich Pates, PA-C 05/23/18 2337    Charlynne Pander, MD 05/23/18 [phone]

## 2018-05-23 NOTE — Discharge Instructions (Signed)
Return to ED for worsening symptoms, severe chest pain or abdominal pain, blood in your stool or vomit, lightheadedness or loss of consciousness.

## 2018-05-23 NOTE — ED Notes (Signed)
Triage Section documented by Arliss JourneyMacon Bonnee Zertuche RN not DJ EMT.

## 2018-05-23 NOTE — ED Triage Notes (Signed)
Per EMS, pt from home, reports thoracic back pain since yesterday.  Denies trauma.  Pt was ambulatory  On scene.  Pt also reports starting her period again today which she just had 3 weeks ago.  Pt reports pain is in her spine.  Tender with palpation.  She reports taking motrin at noon without relief.

## 2018-05-25 LAB — GC/CHLAMYDIA PROBE AMP (~~LOC~~) NOT AT ARMC
Chlamydia: NEGATIVE
NEISSERIA GONORRHEA: NEGATIVE

## 2018-11-25 ENCOUNTER — Emergency Department (HOSPITAL_COMMUNITY)
Admission: EM | Admit: 2018-11-25 | Discharge: 2018-11-25 | Disposition: A | Discharge status: Home / Self Care | Payer: Self-pay | Attending: Emergency Medicine | Admitting: Emergency Medicine

## 2018-11-25 ENCOUNTER — Emergency Department (HOSPITAL_COMMUNITY): Payer: Self-pay

## 2018-11-25 ENCOUNTER — Other Ambulatory Visit: Payer: Self-pay

## 2018-11-25 ENCOUNTER — Encounter (HOSPITAL_COMMUNITY): Payer: Self-pay | Admitting: Emergency Medicine

## 2018-11-25 DIAGNOSIS — Z79899 Other long term (current) drug therapy: Secondary | ICD-10-CM | POA: Insufficient documentation

## 2018-11-25 DIAGNOSIS — B9789 Other viral agents as the cause of diseases classified elsewhere: Secondary | ICD-10-CM

## 2018-11-25 DIAGNOSIS — F17210 Nicotine dependence, cigarettes, uncomplicated: Secondary | ICD-10-CM | POA: Insufficient documentation

## 2018-11-25 DIAGNOSIS — N63 Unspecified lump in unspecified breast: Secondary | ICD-10-CM | POA: Insufficient documentation

## 2018-11-25 DIAGNOSIS — J069 Acute upper respiratory infection, unspecified: Secondary | ICD-10-CM | POA: Insufficient documentation

## 2018-11-25 LAB — POC URINE PREG, ED: PREG TEST UR: NEGATIVE

## 2018-11-25 MED ORDER — IBUPROFEN 600 MG PO TABS: 600.0000 mg | ORAL_TABLET | Freq: Four times a day (QID) | ORAL | 0 refills | Status: DC | PRN

## 2018-11-25 MED ORDER — ACETAMINOPHEN 500 MG PO TABS
1000.0000 mg | ORAL_TABLET | Freq: Once | ORAL | Status: AC
  Administered 2018-11-25: 1000 mg via ORAL
  Filled 2018-11-25: qty 2

## 2018-11-25 NOTE — ED Provider Notes (Signed)
[facility] EMERGENCY DEPARTMENT Provider Note   CSN: [id number] Arrival date & time: 11/25/18  1212     History   Chief Complaint Chief Complaint  Patient presents with  . Cough  . Breast Mass    HPI Carla Carson is a 33 y.o. female.  HPI  Patient is 33 year old female with a history of sexual transmitted infection presenting for right breast mass, cough, myalgias, congestion and rhinorrhea.  Patient reports that she first noticed the breast mass 2 weeks ago.  She reports that it is on the underside of the breast.  She denies any erythema of it.  She denies any nipple drainage.  Patient denies any family history of breast cancer to her knowledge.  She denies any history of similar lesions.  Patient reports that she began having rhinorrhea, congestion, generalized body aches particular in her upper back and cough, nonproductive since last night.  She denies fever or chills at home, but reports she is febrile here.  Patient denies any history of asthma.  She does report smoking 1.5 packs a day.  Patient denies recent known sick contacts.  No remedies tried for symptoms.  Past Medical History:  Diagnosis Date  . Chlamydia   . Gonorrhea   . Infection    UTI  . Ovarian cyst   . Preeclampsia   . Syphilis     Patient Active Problem List   Diagnosis Date Noted  . Labor and delivery, indication for care 11/25/2016  . Abdominal trauma 11/11/2016  . Mental disorder 10/12/2016  . Pregnancy, supervision, high-risk 07/16/2016  . History of syphilis 07/16/2016  . History of cesarean delivery 07/16/2016  . Bipolar disease during pregnancy (HCC) 07/16/2016  . BMI 45.0-49.9, adult (HCC) 07/16/2016  . Obesity in pregnancy 07/16/2016  . GBS (group B Streptococcus carrier), +RV culture, currently pregnant 07/16/2016  . History of prior pregnancy with SGA newborn 07/16/2016  . History of pre-eclampsia 11/07/2015    Past Surgical History:  Procedure Laterality Date   . CESAREAN SECTION N/A 11/07/2015   Procedure: CESAREAN SECTION;  Surgeon: Willodean Rosenthal, MD;  Location: [facility];  Service: Obstetrics;  Laterality: N/A;  . TUBAL LIGATION Bilateral 11/26/2016   Procedure: POST PARTUM TUBAL LIGATION;  Surgeon: Hermina Staggers, MD;  Location: [facility];  Service: Gynecology;  Laterality: Bilateral;  . WISDOM TOOTH EXTRACTION       OB History    Gravida  5   Para  5   Term  4   Preterm  1   AB  0   Living  4     SAB  0   TAB  0   Ectopic  0   Multiple  0   Live Births  4            Home Medications    Prior to Admission medications   Medication Sig Start Date End Date Taking? Authorizing Provider  acetaminophen (TYLENOL) 500 MG tablet Take 1,000 mg by mouth every 6 (six) hours as needed for moderate pain.     [provider]  docusate sodium (COLACE) 100 MG capsule Take 1 capsule (100 mg total) by mouth 2 (two) times daily. Patient not taking: Reported on 05/01/2018 11/28/16   Renne Musca, MD  ibuprofen (ADVIL,MOTRIN) 200 MG tablet Take 200 mg by mouth every 6 (six) hours as needed for moderate pain.    [provider]  ibuprofen (ADVIL,MOTRIN) 600 MG tablet Take 1 tablet (600 mg total)  by mouth every 6 (six) hours as needed for mild pain or moderate pain. Patient not taking: Reported on 05/01/2018 11/28/16   Renne Musca, MD  lidocaine (XYLOCAINE) 2 % solution Use as directed 15 mLs in the mouth or throat as needed for mouth pain. 05/01/18   Joy, Shawn C, PA-C  methocarbamol (ROBAXIN) 500 MG tablet Take 1 tablet (500 mg total) by mouth 2 (two) times daily. 05/23/18   Khatri, Hina, PA-C  metroNIDAZOLE (FLAGYL) 500 MG tablet Take 1 tablet (500 mg total) by mouth 2 (two) times daily. 05/23/18   Khatri, Hina, PA-C  naproxen (NAPROSYN) 500 MG tablet Take 1 tablet (500 mg total) by mouth 2 (two) times daily. 05/23/18   Khatri, Hina, PA-C  oxyCODONE (OXY IR/ROXICODONE) 5 MG immediate release tablet Take 1 tablet (5 mg  total) by mouth every 6 (six) hours as needed for severe pain. Patient not taking: Reported on 05/01/2018 11/28/16   Renne Musca, MD    Family History Family History  Problem Relation Age of Onset  . Heart disease Mother        hole in heart  . Cancer Maternal Grandmother     Social History Social History   Tobacco Use  . Smoking status: Current Every Day Smoker    Packs/day: 0.10  . Smokeless tobacco: Never Used  Substance Use Topics  . Alcohol use: No  . Drug use: Yes    Types: Marijuana    Comment: once in a blue moon     Allergies   Nubain [nalbuphine hcl]   Review of Systems Review of Systems  Constitutional: Positive for chills and fever.  HENT: Positive for congestion and rhinorrhea. Negative for ear pain and sore throat.   Respiratory: Positive for cough.   Gastrointestinal: Negative for abdominal pain, nausea and vomiting.  Skin: Negative for color change.       +Breast mass     Physical Exam Updated Vital Signs BP 133/79 (BP Location: Right Arm)   Pulse 96   Temp (!) 100.5 F (38.1 C) (Oral)   Resp 20   SpO2 100%   Physical Exam Vitals signs and nursing note reviewed.  Constitutional:      General: She is not in acute distress.    Appearance: She is well-developed. She is not diaphoretic.     Comments: Sitting comfortably in bed.  HENT:     Head: Normocephalic and atraumatic.     Mouth/Throat:     Mouth: Mucous membranes are moist.     Comments: Normal phonation. No muffled voice sounds. Patient swallows secretions without difficulty. Dentition normal. No lesions of tongue or buccal mucosa. Uvula midline. No asymmetric swelling of the posterior pharynx. No erythema of posterior pharynx. No tonsillar exuduate. No lingual swelling. No induration inferior to tongue. No submandibular tenderness, swelling, or induration.  Tissues of the neck supple. No cervical lymphadenopathy. Right TM without erythema or effusion; left TM without erythema or  effusion.  Eyes:     General:        Right eye: No discharge.        Left eye: No discharge.     Conjunctiva/sclera: Conjunctivae normal.     Comments: EOMs normal to gross examination.  Neck:     Musculoskeletal: Normal range of motion.  Cardiovascular:     Rate and Rhythm: Normal rate and regular rhythm.     Heart sounds: Normal heart sounds.  Pulmonary:     Effort: Pulmonary effort is  normal.     Breath sounds: Normal breath sounds. No wheezing, rhonchi or rales.  Chest:    Abdominal:     General: There is no distension.  Musculoskeletal: Normal range of motion.  Skin:    General: Skin is warm and dry.     Comments: Breast exam performed with RN chaperone present.  Patient has a 2 cm x 2 cm, firm, well-circumscribed nodule in approximately the 7 o'clock position.  It is mobile.  No surrounding erythema or drainage.  No surrounding skin puckering.  No axillary lymphadenopathy palpated.  Neurological:     Mental Status: She is alert.     Comments: Cranial nerves intact to gross observation. Patient moves extremities without difficulty.  Psychiatric:        Behavior: Behavior normal.        Thought Content: Thought content normal.        Judgment: Judgment normal.      ED Treatments / Results  Labs (all labs ordered are listed, but only abnormal results are displayed) Labs Reviewed - No data to display  EKG None  Radiology No results found.  Procedures Procedures (including critical care time)  Medications Ordered in ED Medications - No data to display   Initial Impression / Assessment and Plan / ED Course  I have reviewed the triage vital signs and the nursing notes.  Pertinent labs & imaging results that were available during my care of the patient were reviewed by me and considered in my medical decision making (see chart for details).     Patient is nontoxic-appearing, and in no acute distress.  Patient is febrile to 100.5 here.  There is no signs of  breast abscess or cellulitis.  Suspect fibroadenoma versus breast cyst.  Patient does not have high risk family history, however will [facility] for further work-up and management given that patient is uninsured and has no primary care provider.  Chest x-ray is negative.  Suspect viral URI with cough.  Patient does not have any immune compromising risk factors or primary lung disease.  I discussed with the patient that she should discuss smoking cessation with her primary care provider when she is able to establish care.  Return precautions given for any persistent fevers, productive cough, no dizziness, lightheadedness, syncope or presyncope, or erythema, edema or drainage of the breast.  Patient is in understanding and agrees with the plan of care.  Final Clinical Impressions(s) / ED Diagnoses   Final diagnoses:  Viral URI with cough  Breast mass    ED Discharge Orders         Ordered    ibuprofen (ADVIL,MOTRIN) 600 MG tablet  Every 6 hours PRN     11/25/18 1403           Elisha PonderMurray, [place] Yeats B, PA-C 11/25/18 1403    Tegeler, Canary Brimhristopher J, MD 11/25/18 1435

## 2018-11-25 NOTE — ED Notes (Signed)
Hard lump about the size of a large marble noted to right breast.

## 2018-11-25 NOTE — ED Notes (Signed)
Patient transported to X-ray 

## 2018-11-25 NOTE — Discharge Instructions (Addendum)
Please read and follow all provided instructions.  Your diagnoses today include:  1. Viral URI with cough   2. Breast mass     You appear to have an upper respiratory infection (URI). An upper respiratory tract infection, or cold, is a viral infection of the air passages leading to the lungs. It should improve gradually after 5-7 days. You may have a lingering cough that lasts for 2- 4 weeks after the infection.  Tests performed today include: Vital signs. See below for your results today.   Medications prescribed:   Take any prescribed medications only as directed. Treatment for your infection is aimed at treating the symptoms. There are no medications, such as antibiotics, that will cure your infection.   I recommend a medication Mucinex DM.  This is guaifenesin-dextromethorphan.  It is found over-the-counter.  It will suppress cough and also loosen the mucus in your sinuses and lungs.  You are prescribed ibuprofen, a non-steroidal anti-inflammatory agent (NSAID) for pain. You may take 600 mg every 6  hours as needed for pain. If still requiring this medication around the clock for acute pain after 10 days, please see your primary healthcare provider.  Women who are pregnant, breastfeeding, or planning on becoming pregnant should not take non-steroidal anti-inflammatories such as Advil and Aleve. Tylenol is a safe over the counter pain reliever in pregnant women.  You may combine this medication with Tylenol, 650 mg every 6 hours, so you are receiving something for pain every 3 hours.  This is not a long-term medication unless under the care and direction of your primary provider. Taking this medication long-term and not under the supervision of a healthcare provider could increase the risk of stomach ulcers, kidney problems, and cardiovascular problems such as high blood pressure.    Home care instructions:  Follow any educational materials contained in this packet.   Your illness is  contagious and can be spread to others, especially during the first 3 or 4 days. It cannot be cured by antibiotics or other medicines. Take basic precautions such as washing your hands often, covering your mouth when you cough or sneeze, and avoiding public places where you could spread your illness to others.   Please continue drinking plenty of fluids.  Use over-the-counter medicines as needed as directed on packaging for symptom relief.  You may also use ibuprofen or tylenol as directed on packaging for pain or fever.  Do not take multiple medicines containing Tylenol or acetaminophen to avoid taking too much of this medication.  Follow-up instructions: Please follow-up with your primary care provider in the next 3 days for further evaluation of your symptoms if you are not feeling better.   Regarding your breast mass, please follow-up with the program I listed above.  Please call to make an appointment.  Return instructions:  Please return to the Emergency Department if you experience worsening symptoms.  RETURN IMMEDIATELY IF you develop shortness of breath, chest pain, confusion or altered mental status, a new rash, become dizzy, faint, or poorly responsive, or are unable to be cared for at home. Please return if you have persistent vomiting and cannot keep down fluids or develop a fever that is not controlled by tylenol or motrin.   Please return immediately for any redness or drainage from the breast.  Please return if you have any other emergent concerns.  Additional Information:  Your vital signs today were: BP 133/79 (BP Location: Right Arm)    Pulse 96  Temp (!) 100.5 F (38.1 C) (Oral)    Resp 20    SpO2 100%  If your blood pressure (BP) was elevated above 135/85 this visit, please have this repeated by your doctor within one month. --------------

## 2018-11-25 NOTE — ED Triage Notes (Signed)
Pt reports a lump in her right breast about the size of a large marble that she found two weeks ago, it is firm. Pt also reports her hair falling out.  Reports cough and congestion that began last night.

## 2019-03-09 ENCOUNTER — Encounter: Payer: Self-pay | Admitting: *Deleted

## 2020-03-24 ENCOUNTER — Encounter (HOSPITAL_COMMUNITY): Payer: Self-pay | Admitting: Emergency Medicine

## 2020-03-24 ENCOUNTER — Emergency Department (HOSPITAL_COMMUNITY)
Admission: EM | Admit: 2020-03-24 | Discharge: 2020-03-24 | Disposition: A | Discharge status: Home / Self Care | Payer: Self-pay | Attending: Emergency Medicine | Admitting: Emergency Medicine

## 2020-03-24 DIAGNOSIS — Y999 Unspecified external cause status: Secondary | ICD-10-CM | POA: Insufficient documentation

## 2020-03-24 DIAGNOSIS — B9689 Other specified bacterial agents as the cause of diseases classified elsewhere: Secondary | ICD-10-CM

## 2020-03-24 DIAGNOSIS — T65891A Toxic effect of other specified substances, accidental (unintentional), initial encounter: Secondary | ICD-10-CM | POA: Insufficient documentation

## 2020-03-24 DIAGNOSIS — Z79899 Other long term (current) drug therapy: Secondary | ICD-10-CM | POA: Insufficient documentation

## 2020-03-24 DIAGNOSIS — Y9234 Swimming pool (public) as the place of occurrence of the external cause: Secondary | ICD-10-CM | POA: Insufficient documentation

## 2020-03-24 DIAGNOSIS — Y9311 Activity, swimming: Secondary | ICD-10-CM | POA: Insufficient documentation

## 2020-03-24 DIAGNOSIS — F17210 Nicotine dependence, cigarettes, uncomplicated: Secondary | ICD-10-CM | POA: Insufficient documentation

## 2020-03-24 DIAGNOSIS — Z202 Contact with and (suspected) exposure to infections with a predominantly sexual mode of transmission: Secondary | ICD-10-CM | POA: Insufficient documentation

## 2020-03-24 DIAGNOSIS — H10212 Acute toxic conjunctivitis, left eye: Secondary | ICD-10-CM | POA: Insufficient documentation

## 2020-03-24 LAB — URINALYSIS, ROUTINE W REFLEX MICROSCOPIC
Bilirubin Urine: NEGATIVE
Glucose, UA: NEGATIVE mg/dL
Hgb urine dipstick: NEGATIVE
Ketones, ur: NEGATIVE mg/dL
Leukocytes,Ua: NEGATIVE
Nitrite: NEGATIVE
Protein, ur: NEGATIVE mg/dL
Specific Gravity, Urine: 1.017 (ref 1.005–1.030)
pH: 6 (ref 5.0–8.0)

## 2020-03-24 LAB — WET PREP, GENITAL
Sperm: NONE SEEN
Trich, Wet Prep: NONE SEEN
Yeast Wet Prep HPF POC: NONE SEEN

## 2020-03-24 LAB — POC URINE PREG, ED: Preg Test, Ur: NEGATIVE

## 2020-03-24 LAB — HIV ANTIBODY (ROUTINE TESTING W REFLEX): HIV Screen 4th Generation wRfx: NONREACTIVE

## 2020-03-24 MED ORDER — METRONIDAZOLE 500 MG PO TABS: 500.0000 mg | ORAL_TABLET | Freq: Two times a day (BID) | ORAL | 0 refills | Status: DC

## 2020-03-24 MED ORDER — TETRACAINE HCL 0.5 % OP SOLN
2.0000 [drp] | Freq: Once | OPHTHALMIC | Status: DC
  Filled 2020-03-24: qty 4

## 2020-03-24 MED ORDER — FLUORESCEIN SODIUM 1 MG OP STRP
1.0000 | ORAL_STRIP | Freq: Once | OPHTHALMIC | Status: DC
  Filled 2020-03-24: qty 1

## 2020-03-24 MED ORDER — ERYTHROMYCIN 5 MG/GM OP OINT: TOPICAL_OINTMENT | OPHTHALMIC | 1 refills | Status: DC

## 2020-03-24 NOTE — ED Triage Notes (Signed)
Pt reports after swimming in a hotel pool she began having red, watery, swollen L eye that started yesterday. Also endorses that she was recently exposed to an STD, endorses some lower back pain with dysuria once over the past few days.

## 2020-03-24 NOTE — ED Notes (Signed)
Pelvic cart at pt's bedside. 

## 2020-03-24 NOTE — Discharge Instructions (Addendum)
Your swab today showed evidence of bacterial vaginosis.  We will treat with Flagyl for 1 week.  This is a antibiotic please take for full course.  Your gonorrhea and Chlamydia tests are pending as well as HIV and syphilis.  You will be called with any positives please shows your phone number is up-to-date in our system.  Please follow-up with the [facility] for further primary care needs.  I also provide you with the [facility] [employer] phone number.  Please call for future STD needs.   Please wait 2 weeks AND be sure that you and your partners are symptom free before returning to sexual activity. Please use protection with every sexual encounter.  I have also provided you with a written prescription for erythromycin ointment to use in 2 days if your symptoms have worsened in your left eye.

## 2020-03-24 NOTE — ED Provider Notes (Signed)
[facility] EMERGENCY DEPARTMENT Provider Note   CSN: [id number] Arrival date & time: 03/24/20  1055     History Chief Complaint  Patient presents with  . Eye Problem  . Exposure to STD    Carla Carson is a 34 y.o. female.  HPI  Patient presents to the emergency department today for left eye redness and irritation.  She states that she has been swimming in chlorinated pool water for the past few days and noticed yesterday and today that it was irritated.  She denies any foreign body sensation currently but states that she did feel that she had a foreign body in her eye initially erupted.  She states that it appeared red after she rubbed it but denies any discharge, vision changes, fevers, chills headache dizziness.  Patient is not a contact lens wear.  Patient also states that she may have been exposed to an STD.  She states that her partner states that he recently slept with somebody who has a sexually transmitted use.  She is uncertain when they slept together.  She states that she has had no vaginal discharge, itching, vaginal pain or bleeding.  She was concerned for pregnancy or STD.  She has a history of gonorrhea, chlamydia, trichomonas but is never had PID before.  She denies any fevers, chills, abdominal pain, nausea or vomiting.  Patient noted in triage to endorse dental pain but did not endorse this with me.     Past Medical History:  Diagnosis Date  . Chlamydia   . Gonorrhea   . Infection    UTI  . Ovarian cyst   . Preeclampsia   . Syphilis     Patient Active Problem List   Diagnosis Date Noted  . Labor and delivery, indication for care 11/25/2016  . Abdominal trauma 11/11/2016  . Mental disorder 10/12/2016  . Pregnancy, supervision, high-risk 07/16/2016  . History of syphilis 07/16/2016  . History of cesarean delivery 07/16/2016  . Bipolar disease during pregnancy (HCC) 07/16/2016  . BMI 45.0-49.9, adult (HCC) 07/16/2016  . Obesity in  pregnancy 07/16/2016  . GBS (group B Streptococcus carrier), +RV culture, currently pregnant 07/16/2016  . History of prior pregnancy with SGA newborn 07/16/2016  . History of pre-eclampsia 11/07/2015    Past Surgical History:  Procedure Laterality Date  . CESAREAN SECTION N/A 11/07/2015   Procedure: CESAREAN SECTION;  Surgeon: Willodean Rosenthal, MD;  Location: [facility];  Service: Obstetrics;  Laterality: N/A;  . TUBAL LIGATION Bilateral 11/26/2016   Procedure: POST PARTUM TUBAL LIGATION;  Surgeon: Hermina Staggers, MD;  Location: [facility];  Service: Gynecology;  Laterality: Bilateral;  . WISDOM TOOTH EXTRACTION       OB History    Gravida  5   Para  5   Term  4   Preterm  1   AB  0   Living  4     SAB  0   TAB  0   Ectopic  0   Multiple  0   Live Births  4           Family History  Problem Relation Age of Onset  . Heart disease Mother        hole in heart  . Cancer Maternal Grandmother     Social History   Tobacco Use  . Smoking status: Current Every Day Smoker    Packs/day: 0.10  . Smokeless tobacco: Never Used  Substance Use Topics  . Alcohol use:  No  . Drug use: Yes    Types: Marijuana    Comment: once in a blue moon    Home Medications Prior to Admission medications   Medication Sig Start Date End Date Taking? Authorizing Provider  acetaminophen (TYLENOL) 500 MG tablet Take 1,000 mg by mouth every 6 (six) hours as needed for moderate pain.     [provider]  docusate sodium (COLACE) 100 MG capsule Take 1 capsule (100 mg total) by mouth 2 (two) times daily. Patient not taking: Reported on 05/01/2018 11/28/16   Renne Musca, MD  erythromycin ophthalmic ointment Place a 1/2 inch ribbon of ointment into the left lower eyelid. 03/24/20   Gailen Shelter, PA  ibuprofen (ADVIL,MOTRIN) 600 MG tablet Take 1 tablet (600 mg total) by mouth every 6 (six) hours as needed for mild pain or moderate pain. 11/25/18   Aviva Kluver B, PA-C  lidocaine  (XYLOCAINE) 2 % solution Use as directed 15 mLs in the mouth or throat as needed for mouth pain. 05/01/18   Joy, Shawn C, PA-C  methocarbamol (ROBAXIN) 500 MG tablet Take 1 tablet (500 mg total) by mouth 2 (two) times daily. 05/23/18   Khatri, Hina, PA-C  metroNIDAZOLE (FLAGYL) 500 MG tablet Take 1 tablet (500 mg total) by mouth 2 (two) times daily. 03/24/20   Gailen Shelter, PA  naproxen (NAPROSYN) 500 MG tablet Take 1 tablet (500 mg total) by mouth 2 (two) times daily. 05/23/18   Khatri, Hina, PA-C  oxyCODONE (OXY IR/ROXICODONE) 5 MG immediate release tablet Take 1 tablet (5 mg total) by mouth every 6 (six) hours as needed for severe pain. Patient not taking: Reported on 05/01/2018 11/28/16   Renne Musca, MD    Allergies    Nubain [nalbuphine hcl]  Review of Systems   Review of Systems  Constitutional: Negative for chills and fever.  HENT: Negative for congestion.   Eyes: Negative for pain.  Respiratory: Negative for cough and shortness of breath.   Cardiovascular: Negative for chest pain and leg swelling.  Gastrointestinal: Negative for abdominal pain and vomiting.  Genitourinary: Negative for dysuria.  Musculoskeletal: Negative for myalgias.  Skin: Negative for rash.  Neurological: Negative for dizziness and headaches.    Physical Exam Updated Vital Signs BP 125/84 (BP Location: Left Arm)   Pulse 84   Temp 98.9 F (37.2 C) (Oral)   Resp 14   Ht 5\' 3"  (1.6 m)   LMP 02/28/2020 (Approximate)   SpO2 97%   BMI 43.05 kg/m   Physical Exam Vitals and nursing note reviewed.  Constitutional:      General: She is not in acute distress.    Comments: Well-appearing 34 year old female  HENT:     Head: Normocephalic and atraumatic.     Nose: Nose normal.     Mouth/Throat:     Mouth: Mucous membranes are moist.  Eyes:     General: No scleral icterus.    Comments: Left eye with very mild eye redness.  Tenderness to palpation globes are soft.  Visual acuity is grossly intact.  EOMI,  PERRLA.  Fluorescein dye without any uptake.  There is no evidence of abrasion or ulcer.  Cardiovascular:     Rate and Rhythm: Normal rate and regular rhythm.     Pulses: Normal pulses.     Heart sounds: Normal heart sounds.  Pulmonary:     Effort: Pulmonary effort is normal. No respiratory distress.     Breath sounds: No wheezing.  Abdominal:     Palpations: Abdomen is soft.     Tenderness: There is no abdominal tenderness.  Genitourinary:    Comments: Deferred-patient will self swab Musculoskeletal:     Cervical back: Normal range of motion.     Right lower leg: No edema.     Left lower leg: No edema.  Skin:    General: Skin is warm and dry.     Capillary Refill: Capillary refill takes less than 2 seconds.  Neurological:     Mental Status: She is alert. Mental status is at baseline.  Psychiatric:        Mood and Affect: Mood normal.        Behavior: Behavior normal.     ED Results / Procedures / Treatments   Labs (all labs ordered are listed, but only abnormal results are displayed) Labs Reviewed  WET PREP, GENITAL - Abnormal; Notable for the following components:      Result Value   Clue Cells Wet Prep HPF POC PRESENT (*)    WBC, Wet Prep HPF POC FEW (*)    All other components within normal limits  URINALYSIS, ROUTINE W REFLEX MICROSCOPIC - Abnormal; Notable for the following components:   APPearance HAZY (*)    All other components within normal limits  HIV ANTIBODY (ROUTINE TESTING W REFLEX)  RPR  POC URINE PREG, ED  GC/CHLAMYDIA PROBE AMP ([place]) NOT AT [facility]    EKG None  Radiology No results found.  Procedures Procedures (including critical care time)  Medications Ordered in ED Medications  tetracaine (PONTOCAINE) 0.5 % ophthalmic solution 2 drop (has no administration in time range)  fluorescein ophthalmic strip 1 strip (has no administration in time range)    ED Course  I have reviewed the triage vital signs and the nursing  notes.  Pertinent labs & imaging results that were available during my care of the patient were reviewed by me and considered in my medical decision making (see chart for details).    MDM Rules/Calculators/A&P                      Patient is a 34 year old female with 2 complaints today.  She is presenting for left eye redness that began yesterday after he swam in a pool with chlorine in it.  She states that she has some itchiness originally but that has not gone away.  She states it mostly gets red when she rubs it.  She denies any discharge.  Low suspicion for bacterial chondritis will provide patient with Romycin printed to use if needed at home if her symptoms do not improve in 2 days however I suspect this most likely, culture antibiotics.  For patient second chief complaint of possible exposure to STD.  She deferred pelvic exam however she did self swab.  Her wet prep shows clue cells present and few WBCs.  Will treat for bacterial vaginosis as she does have evidence of BV infection today.  Will treat with Flagyl 1 week.  Her urinalysis without evidence of infection.  She has no symptoms.  Medication for treatment of culture.  HIV, RPR, GC chlamydia pending at this time.  Pregnancy negative.  Shared decision conversation with patient.  She prefer to defer empiric treatment for STDs at this time.  I think this is reasonable.  We will treat BV with Flagyl and discharged with erythromycin eye ointment to begin in 2 days if her symptoms not improved.  She will follow-up  with the [facility] wellness clinic.  She return to ED for any new or concerning symptoms.  Final Clinical Impression(s) / ED Diagnoses Final diagnoses:  Chemical conjunctivitis of left eye  Possible exposure to STD  Bacterial vaginitis    Rx / DC Orders ED Discharge Orders         Ordered    erythromycin ophthalmic ointment     03/24/20 1343    metroNIDAZOLE (FLAGYL) 500 MG tablet  2 times daily     03/24/20 1346            Solon Augusta Bartlett, [place] 03/24/20 1349    Raeford Razor, MD 03/24/20 [phone]

## 2020-03-25 LAB — RPR: RPR Ser Ql: NONREACTIVE

## 2020-03-26 LAB — GC/CHLAMYDIA PROBE AMP (~~LOC~~) NOT AT ARMC
Chlamydia: NEGATIVE
Comment: NEGATIVE
Comment: NORMAL
Neisseria Gonorrhea: NEGATIVE

## 2020-08-05 ENCOUNTER — Encounter (HOSPITAL_COMMUNITY): Payer: Self-pay | Admitting: Emergency Medicine

## 2020-08-05 ENCOUNTER — Emergency Department (HOSPITAL_COMMUNITY)
Admission: EM | Admit: 2020-08-05 | Discharge: 2020-08-06 | Disposition: A | Discharge status: Home / Self Care | Payer: Self-pay | Attending: Emergency Medicine | Admitting: Emergency Medicine

## 2020-08-05 DIAGNOSIS — N739 Female pelvic inflammatory disease, unspecified: Secondary | ICD-10-CM | POA: Insufficient documentation

## 2020-08-05 DIAGNOSIS — F17200 Nicotine dependence, unspecified, uncomplicated: Secondary | ICD-10-CM | POA: Insufficient documentation

## 2020-08-05 DIAGNOSIS — A5901 Trichomonal vulvovaginitis: Secondary | ICD-10-CM | POA: Insufficient documentation

## 2020-08-05 DIAGNOSIS — R52 Pain, unspecified: Secondary | ICD-10-CM

## 2020-08-05 LAB — URINALYSIS, ROUTINE W REFLEX MICROSCOPIC
Bilirubin Urine: NEGATIVE
Glucose, UA: NEGATIVE mg/dL
Hgb urine dipstick: NEGATIVE
Ketones, ur: NEGATIVE mg/dL
Nitrite: NEGATIVE
Protein, ur: NEGATIVE mg/dL
Specific Gravity, Urine: 1.015 (ref 1.005–1.030)
pH: 6 (ref 5.0–8.0)

## 2020-08-05 LAB — URINALYSIS, MICROSCOPIC (REFLEX)

## 2020-08-05 LAB — PREGNANCY, URINE: Preg Test, Ur: NEGATIVE

## 2020-08-05 NOTE — ED Triage Notes (Signed)
Pt arrives to ED with c/o of lower abd pain and white vaginal discharge for over 1 week. Pt has been told by a partner they were also having symptoms.

## 2020-08-06 ENCOUNTER — Other Ambulatory Visit (HOSPITAL_COMMUNITY): Payer: Self-pay

## 2020-08-06 ENCOUNTER — Emergency Department (HOSPITAL_COMMUNITY): Payer: Self-pay

## 2020-08-06 LAB — GC/CHLAMYDIA PROBE AMP (~~LOC~~) NOT AT ARMC
Chlamydia: POSITIVE — AB
Comment: NEGATIVE
Comment: NORMAL
Neisseria Gonorrhea: POSITIVE — AB

## 2020-08-06 LAB — RPR: RPR Ser Ql: NONREACTIVE

## 2020-08-06 LAB — WET PREP, GENITAL
Clue Cells Wet Prep HPF POC: NONE SEEN
Sperm: NONE SEEN
Yeast Wet Prep HPF POC: NONE SEEN

## 2020-08-06 LAB — HIV ANTIBODY (ROUTINE TESTING W REFLEX): HIV Screen 4th Generation wRfx: NONREACTIVE

## 2020-08-06 MED ORDER — METRONIDAZOLE 500 MG PO TABS: 500.0000 mg | ORAL_TABLET | Freq: Two times a day (BID) | ORAL | 0 refills | Status: DC

## 2020-08-06 MED ORDER — STERILE WATER FOR INJECTION IJ SOLN
INTRAMUSCULAR | Status: AC
  Administered 2020-08-06: 1.2 mL
  Filled 2020-08-06: qty 10

## 2020-08-06 MED ORDER — CEFTRIAXONE SODIUM 500 MG IJ SOLR
500.0000 mg | Freq: Once | INTRAMUSCULAR | Status: AC
  Administered 2020-08-06: 500 mg via INTRAMUSCULAR
  Filled 2020-08-06: qty 500

## 2020-08-06 MED ORDER — NAPROXEN 250 MG PO TABS
500.0000 mg | ORAL_TABLET | Freq: Once | ORAL | Status: AC
  Administered 2020-08-06: 500 mg via ORAL
  Filled 2020-08-06: qty 2

## 2020-08-06 MED ORDER — NAPROXEN 500 MG PO TABS: 500.0000 mg | ORAL_TABLET | Freq: Two times a day (BID) | ORAL | 0 refills | Status: DC | PRN

## 2020-08-06 MED ORDER — DOXYCYCLINE HYCLATE 100 MG PO CAPS: 100.0000 mg | ORAL_CAPSULE | Freq: Two times a day (BID) | ORAL | 0 refills | Status: DC

## 2020-08-06 NOTE — Discharge Instructions (Signed)
You were seen in the emergency department today for vaginal discharge and pelvic pain.  Your labs show that you have trichomonas which is a sexually transmitted infection.  We have tested you for gonorrhea, chlamydia, HIV, and syphilis as well, if any of these results are positive we will call you.  You will need to inform all sexual partners of your positive test TB testing that they can be evaluated and tested.  We have given you antibiotics in the emergency department and are discharging you home with doxycycline and Flagyl each of which are antibiotics.  Please take these as prescribed.  Do not drink alcohol when taking Flagyl as it can be dangerous and have severe side effects.  We are also sending home with naproxen to help with pain  - Naproxen is a nonsteroidal anti-inflammatory medication that will help with pain and swelling. Be sure to take this medication as prescribed with food, 1 pill every 12 hours,  It should be taken with food, as it can cause stomach upset, and more seriously, stomach bleeding. Do not take other nonsteroidal anti-inflammatory medications with this such as Advil, Motrin, Aleve, Mobic, Goodie Powder, or Motrin.    We have prescribed you new medication(s) today. Discuss the medications prescribed today with your pharmacist as they can have adverse effects and interactions with your other medicines including over the counter and prescribed medications. Seek medical evaluation if you start to experience new or abnormal symptoms after taking one of these medicines, seek care immediately if you start to experience difficulty breathing, feeling of your throat closing, facial swelling, or rash as these could be indications of a more serious allergic reaction   Please do not participate in intercourse of any kind until at least 1 week following your last dose of antibiotics to prevent spread of infection.  Please follow-up with OB/GYN within 3 to 5 days for reevaluation.  Return to  the ER for new or worsening symptoms including but not limited to worsening pain, fever, vomiting, or any other concerns.

## 2020-08-06 NOTE — ED Provider Notes (Signed)
[facility] EMERGENCY DEPARTMENT Provider Note   CSN: [id number] Arrival date & time: 08/05/20  7829     History Chief Complaint  Patient presents with   Vaginal Discharge    Carla Carson is a 34 y.o. female with a history of tobacco abuse, prior gonorrhea/chlamydia/syphilis, ovarian cysts, & bipolar disease who presents to the ED with complaints of vaginal discharge x 1 week. Patient states discharge is thick, white, and non-pruritic with associated pelvic pain that is worse on the right side as well as some urinary frequency. No alleviating/aggravating factors. Denies fever, chills, nausea, vomiting, dysuria, or vaginal bleeding. She has been sexually active with multiple partners without protection.   HPI     Past Medical History:  Diagnosis Date   Chlamydia    Gonorrhea    Infection    UTI   Ovarian cyst    Preeclampsia    Syphilis     Patient Active Problem List   Diagnosis Date Noted   Labor and delivery, indication for care 11/25/2016   Abdominal trauma 11/11/2016   Mental disorder 10/12/2016   Pregnancy, supervision, high-risk 07/16/2016   History of syphilis 07/16/2016   History of cesarean delivery 07/16/2016   Bipolar disease during pregnancy (HCC) 07/16/2016   BMI 45.0-49.9, adult (HCC) 07/16/2016   Obesity in pregnancy 07/16/2016   GBS (group B Streptococcus carrier), +RV culture, currently pregnant 07/16/2016   History of prior pregnancy with SGA newborn 07/16/2016   History of pre-eclampsia 11/07/2015    Past Surgical History:  Procedure Laterality Date   CESAREAN SECTION N/A 11/07/2015   Procedure: CESAREAN SECTION;  Surgeon: Willodean Rosenthal, MD;  Location: [facility];  Service: Obstetrics;  Laterality: N/A;   TUBAL LIGATION Bilateral 11/26/2016   Procedure: POST PARTUM TUBAL LIGATION;  Surgeon: Hermina Staggers, MD;  Location: [facility];  Service: Gynecology;  Laterality: Bilateral;   WISDOM TOOTH EXTRACTION        OB History    Gravida  5   Para  5   Term  4   Preterm  1   AB  0   Living  4     SAB  0   TAB  0   Ectopic  0   Multiple  0   Live Births  4           Family History  Problem Relation Age of Onset   Heart disease Mother        hole in heart   Cancer Maternal Grandmother     Social History   Tobacco Use   Smoking status: Current Every Day Smoker    Packs/day: 0.10   Smokeless tobacco: Never Used  Substance Use Topics   Alcohol use: No   Drug use: Yes    Types: Marijuana    Comment: once in a blue moon    Home Medications Prior to Admission medications   Medication Sig Start Date End Date Taking? Authorizing Provider  acetaminophen (TYLENOL) 500 MG tablet Take 1,000 mg by mouth every 6 (six) hours as needed for moderate pain.     [provider]  docusate sodium (COLACE) 100 MG capsule Take 1 capsule (100 mg total) by mouth 2 (two) times daily. Patient not taking: Reported on 05/01/2018 11/28/16   Renne Musca, MD  erythromycin ophthalmic ointment Place a 1/2 inch ribbon of ointment into the left lower eyelid. 03/24/20   Gailen Shelter, PA  ibuprofen (ADVIL,MOTRIN) 600 MG tablet Take 1  tablet (600 mg total) by mouth every 6 (six) hours as needed for mild pain or moderate pain. 11/25/18   Aviva KluverMurray, Alyssa B, PA-C  lidocaine (XYLOCAINE) 2 % solution Use as directed 15 mLs in the mouth or throat as needed for mouth pain. 05/01/18   Joy, Shawn C, PA-C  methocarbamol (ROBAXIN) 500 MG tablet Take 1 tablet (500 mg total) by mouth 2 (two) times daily. 05/23/18   Khatri, Hina, PA-C  metroNIDAZOLE (FLAGYL) 500 MG tablet Take 1 tablet (500 mg total) by mouth 2 (two) times daily. 03/24/20   Gailen ShelterFondaw, Wylder S, PA  naproxen (NAPROSYN) 500 MG tablet Take 1 tablet (500 mg total) by mouth 2 (two) times daily. 05/23/18   Khatri, Hina, PA-C  oxyCODONE (OXY IR/ROXICODONE) 5 MG immediate release tablet Take 1 tablet (5 mg total) by mouth every 6 (six) hours as  needed for severe pain. Patient not taking: Reported on 05/01/2018 11/28/16   Renne MuscaWarden, Daniel L, MD    Allergies    Nubain [nalbuphine hcl]  Review of Systems   Review of Systems  Constitutional: Negative for chills and fever.  Respiratory: Negative for shortness of breath.   Cardiovascular: Negative for leg swelling.  Gastrointestinal: Negative for diarrhea, nausea and vomiting.  Genitourinary: Positive for frequency, pelvic pain and vaginal discharge. Negative for dysuria and vaginal bleeding.  Neurological: Negative for syncope.  All other systems reviewed and are negative.   Physical Exam Updated Vital Signs BP (!) 139/94 (BP Location: Right Arm)    Pulse 89    Temp 98 F (36.7 C) (Oral)    Resp 18    SpO2 100%   Physical Exam Vitals and nursing note reviewed. Exam conducted with a chaperone present.  Constitutional:      General: She is not in acute distress.    Appearance: She is well-developed. She is not toxic-appearing.  HENT:     Head: Normocephalic and atraumatic.  Eyes:     General:        Right eye: No discharge.        Left eye: No discharge.     Conjunctiva/sclera: Conjunctivae normal.  Cardiovascular:     Rate and Rhythm: Normal rate and regular rhythm.  Pulmonary:     Effort: Pulmonary effort is normal. No respiratory distress.     Breath sounds: Normal breath sounds. No wheezing, rhonchi or rales.  Abdominal:     General: There is no distension.     Palpations: Abdomen is soft.     Tenderness: There is no abdominal tenderness. There is no right CVA tenderness, left CVA tenderness, guarding or rebound. Negative signs include McBurney's sign.  Genitourinary:    Comments: No external lesions noted.  Thick white vaginal discharge present in the vaginal vault and around the cervix.  Patient has cervical motion tenderness and right adnexal tenderness to palpation.  No obvious masses. Musculoskeletal:     Cervical back: Neck supple.  Skin:    General: Skin is  warm and dry.     Findings: No rash.  Neurological:     Mental Status: She is alert.     Comments: Clear speech.   Psychiatric:        Behavior: Behavior normal.     ED Results / Procedures / Treatments   Labs (all labs ordered are listed, but only abnormal results are displayed) Labs Reviewed  URINALYSIS, ROUTINE W REFLEX MICROSCOPIC - Abnormal; Notable for the following components:      Result Value  APPearance HAZY (*)    Leukocytes,Ua MODERATE (*)    All other components within normal limits  URINALYSIS, MICROSCOPIC (REFLEX) - Abnormal; Notable for the following components:   Bacteria, UA RARE (*)    Trichomonas, UA PRESENT (*)    All other components within normal limits  WET PREP, GENITAL  URINE CULTURE  PREGNANCY, URINE  RPR  HIV ANTIBODY (ROUTINE TESTING W REFLEX)  GC/CHLAMYDIA PROBE AMP ([place]) NOT AT [facility]    EKG None  Radiology US PELVIC COMPLETE W TRANSVAGINAL AND TORSION R/O  Result Date: 08/06/2020 CLINICAL DATA:  PID.  Right adnexal tenderness. EXAM: TRANSABDOMINAL AND TRANSVAGINAL ULTRASOUND OF PELVIS TECHNIQUE: Both transabdominal and transvaginal ultrasound examinations of the pelvis were performed. Transabdominal technique was performed for global imaging of the pelvis including uterus, ovaries, adnexal regions, and pelvic cul-de-sac. It was necessary to proceed with endovaginal exam following the transabdominal exam to visualize the ovaries. COMPARISON:  03/17/2015 FINDINGS: Uterus Measurements: 9.4 x 4.2 x 6.5 cm = volume: 135 mL. No fibroids or other mass visualized. Endometrium Thickness: 7 mm.  No focal abnormality visualized. Right ovary Measurements: 3.7 x 2.0 x 2.3 cm = volume: 8.5 mL. Normal appearance/no adnexal mass. Left ovary Measurements: 3.5 x 2.2 x 2.0 cm = volume: 8 mL. Normal appearance/no adnexal mass. Other findings Trace pelvic free fluid which may be physiologic. IMPRESSION: Negative pelvic ultrasound. Electronically Signed   By:  Sebastian Ache M.D.   On: 08/06/2020 04:51    Procedures Procedures (including critical care time)  Medications Ordered in ED Medications  cefTRIAXone (ROCEPHIN) injection 500 mg (has no administration in time range)  naproxen (NAPROSYN) tablet 500 mg (500 mg Oral Given 08/06/20 0445)    ED Course  I have reviewed the triage vital signs and the nursing notes.  Pertinent labs & imaging results that were available during my care of the patient were reviewed by me and considered in my medical decision making (see chart for details).    MDM Rules/Calculators/A&P                         Patient presents to the ED with complaints of vaginal discharge & pelvic pain.  Patient is nontoxic and resting comfortably, vitals without significant abnormality.  On exam patient has notable white vaginal discharge as well as cervical motion tenderness and right adnexal tenderness.  Additional history obtained:  Additional history obtained from chart review & nursing note review. Lab Tests:  I reviewed and interpreted labs, which included:  Urinalysis: Trichomonas present, moderate leukocytes felt to more likely related to trichomonas, urine culture sent given her frequency. Pregnancy test: Negative Wet prep: Trichomonas and many WBCs. GC/chlamydia/HIV/RPR: Pending  Imaging Studies ordered:  I ordered imaging studies which included Pelvic ultrasound, I independently visualized and interpreted imaging which showed no significant abnormalities.   Preg test negative- doubt ectopic. Repeat abdominal exam w/o peritoneal signs- negative mcburneys point- doubt appendicitis. [place] w/o torsion, ovarian cyst, or TOA. UA w/ leuks however only rare bacteria- trich positive- likely more related to this as opposed to UTI- culture sent.  Given patient has trichomonas with cervical motion and adnexal tenderness will treat for pelvic inflammatory disease.  Discussed need to inform all sexual partners and need for  abstinence until completion of treatment and 1 week following. GYN Follow up. I discussed results, treatment plan, need for follow-up, and return precautions with the patient. Provided opportunity for questions, patient confirmed understanding and is in  agreement with plan.    Portions of this note were generated with Scientist, clinical (histocompatibility and immunogenetics). Dictation errors may occur despite best attempts at proofreading.  Final Clinical Impression(s) / ED Diagnoses Final diagnoses:  Trichomonal vaginitis  Pelvic inflammatory disease    Rx / DC Orders ED Discharge Orders         Ordered    doxycycline (VIBRAMYCIN) 100 MG capsule  2 times daily        08/06/20 0504    metroNIDAZOLE (FLAGYL) 500 MG tablet  2 times daily        08/06/20 0504    naproxen (NAPROSYN) 500 MG tablet  2 times daily PRN        08/06/20 0504           Cherly Anderson, PA-C 08/06/20 3474    Shon Baton, MD 08/06/20 [id number]

## 2020-08-06 NOTE — ED Notes (Signed)
Patient transported to Ultrasound 

## 2020-08-07 LAB — URINE CULTURE: Culture: NO GROWTH

## 2020-08-15 ENCOUNTER — Other Ambulatory Visit: Payer: Self-pay

## 2020-08-15 ENCOUNTER — Emergency Department (HOSPITAL_COMMUNITY)
Admission: EM | Admit: 2020-08-15 | Discharge: 2020-08-15 | Disposition: A | Discharge status: Home / Self Care | Payer: Self-pay | Attending: Emergency Medicine | Admitting: Emergency Medicine

## 2020-08-15 ENCOUNTER — Emergency Department (HOSPITAL_COMMUNITY): Payer: Self-pay

## 2020-08-15 ENCOUNTER — Encounter (HOSPITAL_COMMUNITY): Payer: Self-pay

## 2020-08-15 DIAGNOSIS — S91311A Laceration without foreign body, right foot, initial encounter: Secondary | ICD-10-CM | POA: Insufficient documentation

## 2020-08-15 DIAGNOSIS — F17200 Nicotine dependence, unspecified, uncomplicated: Secondary | ICD-10-CM | POA: Insufficient documentation

## 2020-08-15 DIAGNOSIS — W25XXXA Contact with sharp glass, initial encounter: Secondary | ICD-10-CM | POA: Insufficient documentation

## 2020-08-15 DIAGNOSIS — Z23 Encounter for immunization: Secondary | ICD-10-CM | POA: Insufficient documentation

## 2020-08-15 MED ORDER — TETANUS-DIPHTH-ACELL PERTUSSIS 5-2.5-18.5 LF-MCG/0.5 IM SUSP
0.5000 mL | Freq: Once | INTRAMUSCULAR | Status: AC
  Administered 2020-08-15: 0.5 mL via INTRAMUSCULAR
  Filled 2020-08-15: qty 0.5

## 2020-08-15 MED ORDER — LEVOFLOXACIN 500 MG PO TABS: 500.0000 mg | ORAL_TABLET | Freq: Every day | ORAL | 0 refills | Status: DC

## 2020-08-15 NOTE — ED Triage Notes (Signed)
Patient states she stepped on glass and it went through her rubber shoe on the right foot. Patient states she has one puncture wound.

## 2020-08-15 NOTE — ED Provider Notes (Signed)
[facility]-EMERGENCY DEPT Provider Note   CSN: [id number] Arrival date & time: 08/15/20  1120     History Chief Complaint  Patient presents with  . Foot Injury    Carla Carson is a 34 y.o. female.  HPI Patient presents with right foot injury.  States last night she was walking in the neighborhood in her crocs and a piece of glass went through her shoe and into her foot.  States had some continued bleeding.  Some pain in the foot.  Came in today to be checked out.  Unknown last tetanus.  No fevers.  No numbness or weakness.  Only pain is at the site of the laceration.    Past Medical History:  Diagnosis Date  . Chlamydia   . Gonorrhea   . Infection    UTI  . Ovarian cyst   . Preeclampsia   . Syphilis     Patient Active Problem List   Diagnosis Date Noted  . Labor and delivery, indication for care 11/25/2016  . Abdominal trauma 11/11/2016  . Mental disorder 10/12/2016  . Pregnancy, supervision, high-risk 07/16/2016  . History of syphilis 07/16/2016  . History of cesarean delivery 07/16/2016  . Bipolar disease during pregnancy (HCC) 07/16/2016  . BMI 45.0-49.9, adult (HCC) 07/16/2016  . Obesity in pregnancy 07/16/2016  . GBS (group B Streptococcus carrier), +RV culture, currently pregnant 07/16/2016  . History of prior pregnancy with SGA newborn 07/16/2016  . History of pre-eclampsia 11/07/2015    Past Surgical History:  Procedure Laterality Date  . CESAREAN SECTION N/A 11/07/2015   Procedure: CESAREAN SECTION;  Surgeon: Willodean Rosenthal, MD;  Location: [facility];  Service: Obstetrics;  Laterality: N/A;  . TUBAL LIGATION Bilateral 11/26/2016   Procedure: POST PARTUM TUBAL LIGATION;  Surgeon: Hermina Staggers, MD;  Location: [facility];  Service: Gynecology;  Laterality: Bilateral;  . WISDOM TOOTH EXTRACTION       OB History    Gravida  5   Para  5   Term  4   Preterm  1   AB  0   Living  4     SAB  0   TAB  0   Ectopic  0    Multiple  0   Live Births  4           Family History  Problem Relation Age of Onset  . Heart disease Mother        hole in heart  . Cancer Maternal Grandmother     Social History   Tobacco Use  . Smoking status: Current Every Day Smoker    Packs/day: 1.00  . Smokeless tobacco: Never Used  Vaping Use  . Vaping Use: Never used  Substance Use Topics  . Alcohol use: No  . Drug use: Yes    Types: Marijuana    Comment: once in a blue moon    Home Medications Prior to Admission medications   Medication Sig Start Date End Date Taking? Authorizing Provider  acetaminophen (TYLENOL) 500 MG tablet Take 1,000 mg by mouth every 6 (six) hours as needed for moderate pain.     [provider]  doxycycline (VIBRAMYCIN) 100 MG capsule Take 1 capsule (100 mg total) by mouth 2 (two) times daily. 08/06/20   Petrucelli, Samantha R, PA-C  erythromycin ophthalmic ointment Place a 1/2 inch ribbon of ointment into the left lower eyelid. 03/24/20   Gailen Shelter, PA  levofloxacin (LEVAQUIN) 500 MG tablet Take 1  tablet (500 mg total) by mouth daily. 08/15/20   Benjiman Core, MD  lidocaine (XYLOCAINE) 2 % solution Use as directed 15 mLs in the mouth or throat as needed for mouth pain. 05/01/18   Joy, Shawn C, PA-C  methocarbamol (ROBAXIN) 500 MG tablet Take 1 tablet (500 mg total) by mouth 2 (two) times daily. 05/23/18   Khatri, Hina, PA-C  metroNIDAZOLE (FLAGYL) 500 MG tablet Take 1 tablet (500 mg total) by mouth 2 (two) times daily. 08/06/20   Petrucelli, Samantha R, PA-C  naproxen (NAPROSYN) 500 MG tablet Take 1 tablet (500 mg total) by mouth 2 (two) times daily as needed for moderate pain. 08/06/20   Petrucelli, Samantha R, PA-C    Allergies    Nubain [nalbuphine hcl]  Review of Systems   Review of Systems  Constitutional: Negative for appetite change.  Respiratory: Negative for shortness of breath.   Genitourinary: Negative for flank pain.  Musculoskeletal: Negative for back  pain.  Skin: Negative for wound.  Neurological: Negative for weakness and numbness.    Physical Exam Updated Vital Signs BP 130/85 (BP Location: Right Arm)   Pulse 85   Temp 98.2 F (36.8 C) (Oral)   Resp 16   Ht 5\' 3"  (1.6 m)   Wt 119.3 kg   LMP 08/15/2020   SpO2 100%   BMI 46.59 kg/m   Physical Exam Vitals and nursing note reviewed.  HENT:     Head: Atraumatic.  Musculoskeletal:     Cervical back: Neck supple.     Comments: Approximately 1 cm laceration on plantar aspect of right midfoot.  No active bleeding.  No foreign body seen.  No bony tenderness.  Skin:    General: Skin is warm.     Capillary Refill: Capillary refill takes less than 2 seconds.  Neurological:     Mental Status: She is alert.     ED Results / Procedures / Treatments   Labs (all labs ordered are listed, but only abnormal results are displayed) Labs Reviewed - No data to display  EKG None  Radiology DG Foot 2 Views Right  Result Date: 08/15/2020 CLINICAL DATA:  Plantar foot puncture wound. Assess for foreign body EXAM: RIGHT FOOT - 2 VIEW COMPARISON:  None. FINDINGS: There is no evidence of fracture or dislocation. There is no evidence of arthropathy or other focal bone abnormality. Soft tissues are unremarkable. No radiopaque soft tissue foreign body identified. IMPRESSION: Negative. Electronically Signed   By: 08/17/2020 D.O.   On: 08/15/2020 14:40    Procedures Procedures (including critical care time)  Medications Ordered in ED Medications  Tdap (BOOSTRIX) injection 0.5 mL (0.5 mLs Intramuscular Given 08/15/20 1540)    ED Course  I have reviewed the triage vital signs and the nursing notes.  Pertinent labs & imaging results that were available during my care of the patient were reviewed by me and considered in my medical decision making (see chart for details).    MDM Rules/Calculators/A&P                          Patient with foot laceration.  Stepped on glass through  her shoe.  X-ray shows no foreign body.  Since it was through shoe will treat with 3-day course of Levaquin.  I think overall low risk with Levaquin overall even it is a riskier medication but the treatment will only be 3 days.  Discharge home with outpatient follow-up as needed. Final  Clinical Impression(s) / ED Diagnoses Final diagnoses:  Laceration of right foot, initial encounter    Rx / DC Orders ED Discharge Orders         Ordered    levofloxacin (LEVAQUIN) 500 MG tablet  Daily        08/15/20 1452           Benjiman Core, MD 08/15/20 1655

## 2020-10-07 ENCOUNTER — Other Ambulatory Visit: Payer: Self-pay

## 2020-10-07 ENCOUNTER — Emergency Department (HOSPITAL_COMMUNITY)
Admission: EM | Admit: 2020-10-07 | Discharge: 2020-10-07 | Disposition: A | Discharge status: Home / Self Care | Payer: Self-pay | Attending: Emergency Medicine | Admitting: Emergency Medicine

## 2020-10-07 DIAGNOSIS — R519 Headache, unspecified: Secondary | ICD-10-CM | POA: Insufficient documentation

## 2020-10-07 DIAGNOSIS — F17200 Nicotine dependence, unspecified, uncomplicated: Secondary | ICD-10-CM | POA: Insufficient documentation

## 2020-10-07 MED ORDER — DIPHENHYDRAMINE HCL 25 MG PO CAPS
25.0000 mg | ORAL_CAPSULE | Freq: Once | ORAL | Status: AC
  Administered 2020-10-07: 16:00:00 25 mg via ORAL
  Filled 2020-10-07: qty 1

## 2020-10-07 MED ORDER — PROCHLORPERAZINE MALEATE 5 MG PO TABS
10.0000 mg | ORAL_TABLET | Freq: Once | ORAL | Status: AC
  Administered 2020-10-07: 16:00:00 10 mg via ORAL
  Filled 2020-10-07: qty 2

## 2020-10-07 MED ORDER — KETOROLAC TROMETHAMINE 15 MG/ML IJ SOLN
15.0000 mg | Freq: Once | INTRAMUSCULAR | Status: AC
  Administered 2020-10-07: 16:00:00 15 mg via INTRAMUSCULAR
  Filled 2020-10-07: qty 1

## 2020-10-07 NOTE — ED Triage Notes (Signed)
Pt with frequent headaches over the last several days. Taking tylenol rapid release with improvement. Also would like to be checked for diabetes since it runs in her family.

## 2020-10-07 NOTE — ED Provider Notes (Signed)
[facility] EMERGENCY DEPARTMENT Provider Note   CSN: [id number] Arrival date & time: 10/07/20  1043     History Chief Complaint  Patient presents with  . Headache    Carla Carson is a 34 y.o. female.  35 y.o female with no pertinent past medical history presents to the ED with a chief complaint of headaches for several weeks.  Has taken some Tylenol with improvement of her symptoms.  Patient reports she is concerned that this headaches may be originating from her blood pressure, her blood sugar?,  She does have family members does suffer from diabetes.  Patient also endorses alcohol use, smoking, does not have any physician established.  No nausea, vomiting, chest pain, visual changes, neck rigidity or fevers.  The history is provided by the patient and a significant other.  Headache Pain location:  R parietal Quality:  Sharp Radiates to:  Does not radiate Severity currently:  5/10 Duration:  2 weeks Timing:  Intermittent Progression:  Unchanged Chronicity:  Recurrent Associated symptoms: no fever and no photophobia        Past Medical History:  Diagnosis Date  . Chlamydia   . Gonorrhea   . Infection    UTI  . Ovarian cyst   . Preeclampsia   . Syphilis     Patient Active Problem List   Diagnosis Date Noted  . Labor and delivery, indication for care 11/25/2016  . Abdominal trauma 11/11/2016  . Mental disorder 10/12/2016  . Pregnancy, supervision, high-risk 07/16/2016  . History of syphilis 07/16/2016  . History of cesarean delivery 07/16/2016  . Bipolar disease during pregnancy (HCC) 07/16/2016  . BMI 45.0-49.9, adult (HCC) 07/16/2016  . Obesity in pregnancy 07/16/2016  . GBS (group B Streptococcus carrier), +RV culture, currently pregnant 07/16/2016  . History of prior pregnancy with SGA newborn 07/16/2016  . History of pre-eclampsia 11/07/2015    Past Surgical History:  Procedure Laterality Date  . CESAREAN SECTION N/A 11/07/2015    Procedure: CESAREAN SECTION;  Surgeon: Willodean Rosenthal, MD;  Location: [facility];  Service: Obstetrics;  Laterality: N/A;  . TUBAL LIGATION Bilateral 11/26/2016   Procedure: POST PARTUM TUBAL LIGATION;  Surgeon: Hermina Staggers, MD;  Location: [facility];  Service: Gynecology;  Laterality: Bilateral;  . WISDOM TOOTH EXTRACTION       OB History    Gravida  5   Para  5   Term  4   Preterm  1   AB  0   Living  4     SAB  0   IAB  0   Ectopic  0   Multiple  0   Live Births  4           Family History  Problem Relation Age of Onset  . Heart disease Mother        hole in heart  . Cancer Maternal Grandmother     Social History   Tobacco Use  . Smoking status: Current Every Day Smoker    Packs/day: 1.00  . Smokeless tobacco: Never Used  Vaping Use  . Vaping Use: Never used  Substance Use Topics  . Alcohol use: No  . Drug use: Yes    Types: Marijuana    Comment: once in a blue moon    Home Medications Prior to Admission medications   Medication Sig Start Date End Date Taking? Authorizing Provider  acetaminophen (TYLENOL) 500 MG tablet Take 1,000 mg by mouth every 6 (six) hours as  needed for moderate pain.     [provider]  doxycycline (VIBRAMYCIN) 100 MG capsule Take 1 capsule (100 mg total) by mouth 2 (two) times daily. 08/06/20   Petrucelli, Samantha R, PA-C  erythromycin ophthalmic ointment Place a 1/2 inch ribbon of ointment into the left lower eyelid. 03/24/20   Gailen Shelter, PA  levofloxacin (LEVAQUIN) 500 MG tablet Take 1 tablet (500 mg total) by mouth daily. 08/15/20   Benjiman Core, MD  lidocaine (XYLOCAINE) 2 % solution Use as directed 15 mLs in the mouth or throat as needed for mouth pain. 05/01/18   Joy, Shawn C, PA-C  methocarbamol (ROBAXIN) 500 MG tablet Take 1 tablet (500 mg total) by mouth 2 (two) times daily. 05/23/18   Khatri, Hina, PA-C  metroNIDAZOLE (FLAGYL) 500 MG tablet Take 1 tablet (500 mg total) by mouth 2 (two) times  daily. 08/06/20   Petrucelli, Samantha R, PA-C  naproxen (NAPROSYN) 500 MG tablet Take 1 tablet (500 mg total) by mouth 2 (two) times daily as needed for moderate pain. 08/06/20   Petrucelli, Samantha R, PA-C    Allergies    Nubain [nalbuphine hcl]  Review of Systems   Review of Systems  Constitutional: Negative for fever.  Eyes: Negative for photophobia.  Respiratory: Negative for shortness of breath.   Neurological: Positive for headaches. Negative for light-headedness.    Physical Exam Updated Vital Signs BP (!) 138/95 (BP Location: Left Arm)   Pulse 84   Temp 98 F (36.7 C) (Oral)   Resp 18   SpO2 100%   Physical Exam Vitals and nursing note reviewed.  Constitutional:      Appearance: She is well-developed.     Comments: None ill-appearing, nontoxic appearing.  HENT:     Head: Normocephalic and atraumatic.  Cardiovascular:     Rate and Rhythm: Normal rate.  Pulmonary:     Effort: Pulmonary effort is normal.     Breath sounds: Normal breath sounds.  Abdominal:     Palpations: Abdomen is soft.     Tenderness: There is no abdominal tenderness.  Musculoskeletal:     Cervical back: Normal range of motion.  Skin:    General: Skin is warm and dry.  Neurological:     Mental Status: She is alert and oriented to person, place, and time.     GCS: GCS eye subscore is 4. GCS verbal subscore is 5. GCS motor subscore is 6.     Comments: Alert, oriented, thought content appropriate. Speech fluent without evidence of aphasia. Able to follow 2 step commands without difficulty.  Cranial Nerves:  II:  Peripheral visual fields grossly normal, pupils, round, reactive to light III,IV, VI: ptosis not present, extra-ocular motions intact bilaterally  V,VII: smile symmetric, facial light touch sensation equal VIII: hearing grossly normal bilaterally  IX,X: midline uvula rise  XI: bilateral shoulder shrug equal and strong XII: midline tongue extension  Motor:  5/5 in upper and lower  extremities bilaterally including strong and equal grip strength and dorsiflexion/plantar flexion Sensory: light touch normal in all extremities.  Cerebellar: normal finger-to-nose with bilateral upper extremities, pronator drift negative Gait: normal gait and balance      ED Results / Procedures / Treatments   Labs (all labs ordered are listed, but only abnormal results are displayed) Labs Reviewed - No data to display  EKG None  Radiology No results found.  Procedures Procedures (including critical care time)  Medications Ordered in ED Medications  ketorolac (TORADOL) 15 MG/ML  injection 15 mg (15 mg Intramuscular Given 10/07/20 1537)  prochlorperazine (COMPAZINE) tablet 10 mg (10 mg Oral Given 10/07/20 1537)  diphenhydrAMINE (BENADRYL) capsule 25 mg (25 mg Oral Given 10/07/20 1537)    ED Course  I have reviewed the triage vital signs and the nursing notes.  Pertinent labs & imaging results that were available during my care of the patient were reviewed by me and considered in my medical decision making (see chart for details).    MDM Rules/Calculators/A&P   Patient with no pertinent past medical history presents to the ED with a chief complaint of headaches for several weeks.  She shows concern for diabetes, not previously tested, does not have any primary care.  Patient is overall nontoxic, non-ill-appearing, neuro exam is reassuring, normal gait.  Significant other is requesting CAT scan, we discussed risks and benefits of this.  Blood pressure is slightly elevated on today's visit, patient does endorse also tobacco use, alcohol use.  She is afebrile without any neck rigidity, neck pain, lower suspicion for meningitis.  Neuro exam is reassuring, lower suspicion for intracranial pathology, she is overall well-appearing without any focal deficit.  Vitals are otherwise within normal limits.  We discussed treatment of her headache with pain medication.  She will also need  primary care, will be given a referral for the [place] and wellness clinic in order to establish this and further evaluation of her high blood pressure, diabetes? .  Patient is agreeable of this at this time.  4:10 PM patient reassessed by me, reports headache has now resolved, she is ambulatory in the ED with a steady gait.  I have lower suspicion for intracranial pathology.  She was provided with the [place] and wellness clinic in order to establish primary care.  Return precautions discussed at length.   Portions of this note were generated with Scientist, clinical (histocompatibility and immunogenetics). Dictation errors may occur despite best attempts at proofreading.  Final Clinical Impression(s) / ED Diagnoses Final diagnoses:  Bad headache    Rx / DC Orders ED Discharge Orders    None       Claude Manges, PA-C 10/07/20 1611    Derwood Kaplan, MD 10/09/20 [phone]

## 2020-10-07 NOTE — Discharge Instructions (Addendum)
I have provided the number to the [facility], please schedule an appointment to establish primary care and further evaluate your high blood pressure in blood sugar.  If you experience any chest pain, changes in your headaches please return to the emergency room.

## 2020-12-13 ENCOUNTER — Telehealth: Payer: Self-pay | Admitting: General Practice

## 2020-12-13 NOTE — Telephone Encounter (Signed)
Attempted to reach patient twice at phone number listed in the appt notes. The call would no completed. I was calling patient to advise her that her appointment for 12/27/20 has been cancelled due to the provider being out of the office. Patient can be rescheduled if she calls back.

## 2020-12-17 ENCOUNTER — Encounter (HOSPITAL_COMMUNITY): Payer: Self-pay

## 2020-12-17 ENCOUNTER — Other Ambulatory Visit: Payer: Self-pay

## 2020-12-17 ENCOUNTER — Ambulatory Visit (HOSPITAL_COMMUNITY)
Admission: EM | Admit: 2020-12-17 | Discharge: 2020-12-17 | Disposition: A | Discharge status: Home / Self Care | Payer: Worker's Compensation | Attending: Student | Admitting: Student

## 2020-12-17 DIAGNOSIS — S41112A Laceration without foreign body of left upper arm, initial encounter: Secondary | ICD-10-CM

## 2020-12-17 DIAGNOSIS — K5901 Slow transit constipation: Secondary | ICD-10-CM

## 2020-12-17 MED ORDER — TRIPLE ANTIBIOTIC 5-400-5000 EX OINT: TOPICAL_OINTMENT | Freq: Two times a day (BID) | CUTANEOUS | 0 refills | Status: DC

## 2020-12-17 MED ORDER — AMOXICILLIN-POT CLAVULANATE 875-125 MG PO TABS: 1.0000 | ORAL_TABLET | Freq: Two times a day (BID) | ORAL | 0 refills | Status: DC

## 2020-12-17 MED ORDER — DOCUSATE SODIUM 100 MG PO CAPS: 100.0000 mg | ORAL_CAPSULE | Freq: Two times a day (BID) | ORAL | 0 refills | Status: DC

## 2020-12-17 NOTE — Discharge Instructions (Addendum)
-  Start the augmentin, two pills daily for 7 days.  -Start the colace, 1-2 pills daily to induce bowel movement. Continue drinking plenty of water.  -If you develop abdominal pain, fevers/chils, chest pain, etc- head to ED.

## 2020-12-17 NOTE — ED Provider Notes (Addendum)
[facility]    CSN: [id number] Arrival date & time: 12/17/20  1816      History   Chief Complaint Chief Complaint  Patient presents with  . Laceration    HPI Carla Carson is a 35 y.o. female presenting with multiple complaints.  -Pt presents with laceration to left forearm sustained 2 days ago. Pt states she cut herself with a machine at work.. She states she has been cleaning the wound with hydrogen peroxide multiple times daily.injury has continued to hurt and feels numb surrounding the area. Tdap UTD. Denies wrist, hand, elbow pain, denies difficulty using her arm.   -Also endorses constipation and hard stool. Still having daily bowel movements but these have been hard and difficult to pass. Generalized crampy abd pain. Still passing gas. Denies urinary symptoms.  HPI  Past Medical History:  Diagnosis Date  . Chlamydia   . Gonorrhea   . Infection    UTI  . Ovarian cyst   . Preeclampsia   . Syphilis     Patient Active Problem List   Diagnosis Date Noted  . Labor and delivery, indication for care 11/25/2016  . Abdominal trauma 11/11/2016  . Mental disorder 10/12/2016  . Pregnancy, supervision, high-risk 07/16/2016  . History of syphilis 07/16/2016  . History of cesarean delivery 07/16/2016  . Bipolar disease during pregnancy (HCC) 07/16/2016  . BMI 45.0-49.9, adult (HCC) 07/16/2016  . Obesity in pregnancy 07/16/2016  . GBS (group B Streptococcus carrier), +RV culture, currently pregnant 07/16/2016  . History of prior pregnancy with SGA newborn 07/16/2016  . History of pre-eclampsia 11/07/2015    Past Surgical History:  Procedure Laterality Date  . CESAREAN SECTION N/A 11/07/2015   Procedure: CESAREAN SECTION;  Surgeon: Willodean Rosenthal, MD;  Location: [facility];  Service: Obstetrics;  Laterality: N/A;  . TUBAL LIGATION Bilateral 11/26/2016   Procedure: POST PARTUM TUBAL LIGATION;  Surgeon: Hermina Staggers, MD;  Location: [facility];  Service:  Gynecology;  Laterality: Bilateral;  . WISDOM TOOTH EXTRACTION      OB History    Gravida  5   Para  5   Term  4   Preterm  1   AB  0   Living  4     SAB  0   IAB  0   Ectopic  0   Multiple  0   Live Births  4            Home Medications    Prior to Admission medications   Medication Sig Start Date End Date Taking? Authorizing Provider  amoxicillin-clavulanate (AUGMENTIN) 875-125 MG tablet Take 1 tablet by mouth every 12 (twelve) hours. 12/17/20  Yes Rhys Martini, PA-C  docusate sodium (COLACE) 100 MG capsule Take 1 capsule (100 mg total) by mouth every 12 (twelve) hours. 12/17/20  Yes Rhys Martini, PA-C  acetaminophen (TYLENOL) 500 MG tablet Take 1,000 mg by mouth every 6 (six) hours as needed for moderate pain.     [provider]  erythromycin ophthalmic ointment Place a 1/2 inch ribbon of ointment into the left lower eyelid. 03/24/20   Gailen Shelter, PA  levofloxacin (LEVAQUIN) 500 MG tablet Take 1 tablet (500 mg total) by mouth daily. 08/15/20   Benjiman Core, MD  lidocaine (XYLOCAINE) 2 % solution Use as directed 15 mLs in the mouth or throat as needed for mouth pain. 05/01/18   Joy, Shawn C, PA-C  methocarbamol (ROBAXIN) 500 MG tablet Take 1 tablet (500  mg total) by mouth 2 (two) times daily. 05/23/18   Khatri, Hina, PA-C  metroNIDAZOLE (FLAGYL) 500 MG tablet Take 1 tablet (500 mg total) by mouth 2 (two) times daily. 08/06/20   Petrucelli, Samantha R, PA-C  naproxen (NAPROSYN) 500 MG tablet Take 1 tablet (500 mg total) by mouth 2 (two) times daily as needed for moderate pain. 08/06/20   Petrucelli, Pleas Koch, PA-C    Family History Family History  Problem Relation Age of Onset  . Heart disease Mother        hole in heart  . Cancer Maternal Grandmother     Social History Social History   Tobacco Use  . Smoking status: Current Every Day Smoker    Packs/day: 1.00  . Smokeless tobacco: Never Used  Vaping Use  . Vaping Use: Never  used  Substance Use Topics  . Alcohol use: No  . Drug use: Yes    Types: Marijuana    Comment: once in a blue moon     Allergies   Nubain [nalbuphine hcl]   Review of Systems Review of Systems  Gastrointestinal: Positive for abdominal pain and constipation. Negative for abdominal distention, anal bleeding, blood in stool, diarrhea, nausea, rectal pain and vomiting.  Skin: Positive for wound.  All other systems reviewed and are negative.    Physical Exam Triage Vital Signs ED Triage Vitals  Enc Vitals Group     BP 12/17/20 1913 102/90     Pulse Rate 12/17/20 1913 85     Resp 12/17/20 1913 17     Temp 12/17/20 1915 99.8 F (37.7 C)     Temp Source 12/17/20 1915 Oral     SpO2 12/17/20 1913 100 %     Weight --      Height --      Head Circumference --      Peak Flow --      Pain Score 12/17/20 1913 5     Pain Loc --      Pain Edu? --      Excl. in GC? --    No data found.  Updated Vital Signs BP 102/90 (BP Location: Right Arm)   Pulse 85   Temp 99.8 F (37.7 C) (Oral)   Resp 17   LMP 11/01/2020 (Exact Date)   SpO2 100%   Visual Acuity Right Eye Distance:   Left Eye Distance:   Bilateral Distance:    Right Eye Near:   Left Eye Near:    Bilateral Near:     Physical Exam Vitals reviewed.  Constitutional:      General: She is not in acute distress.    Appearance: Normal appearance. She is not ill-appearing.  HENT:     Head: Normocephalic and atraumatic.  Cardiovascular:     Rate and Rhythm: Normal rate and regular rhythm.     Heart sounds: Normal heart sounds.  Pulmonary:     Effort: Pulmonary effort is normal.     Breath sounds: Normal breath sounds. No wheezing, rhonchi or rales.  Abdominal:     General: Bowel sounds are normal. There is no distension.     Palpations: Abdomen is soft. There is no mass.     Tenderness: There is generalized abdominal tenderness. There is no right CVA tenderness, left CVA tenderness, guarding or rebound. Negative  signs include Murphy's sign, Rovsing's sign and McBurney's sign.  Skin:    Comments: L dorsal forearm with 3cm laceration with surrounding tenderness and erythema. Sensation decreased  for 1cm surrounding wound. Not actively bleeding, no discharge. Neurovascularly intact. ROM elbow and wrist intact and without pain. Cap refill <2 second.s.  Neurological:     General: No focal deficit present.     Mental Status: She is alert and oriented to person, place, and time.  Psychiatric:        Mood and Affect: Mood normal.        Behavior: Behavior normal.         UC Treatments / Results  Labs (all labs ordered are listed, but only abnormal results are displayed) Labs Reviewed - No data to display  EKG   Radiology No results found.  Procedures Procedures (including critical care time)  Medications Ordered in UC Medications - No data to display  Initial Impression / Assessment and Plan / UC Course  I have reviewed the triage vital signs and the nursing notes.  Pertinent labs & imaging results that were available during my care of the patient were reviewed by me and considered in my medical decision making (see chart for details).     This patient is a 35 year old female presenting with L arm laceration that occurred 2 days ago. Today she is afebrile and nontachycardic.  Tdap UTD 07/2020.  Given 2 days since injury, no sutures applied today. Wound care provided today. Augmentin sent as below. Antibiotic ointment sent.  Wound care instructions provided.  For constipation, Colace as below. rec good hydration. STRICT return precautions provided. New/worsening abdominal pain, fevers/chills, stop passing gas, etc- head straight to ED. She verbalizes understanding and agreement multiple times.  Spent over 40 minutes obtaining H&P, performing physical, discussing results, treatment plan and plan for follow-up with patient. Patient agrees with plan.    Final Clinical Impressions(s) /  UC Diagnoses   Final diagnoses:  Arm laceration, left, initial encounter  Slow transit constipation   Discharge Instructions   None    ED Prescriptions    Medication Sig Dispense Auth. Provider   amoxicillin-clavulanate (AUGMENTIN) 875-125 MG tablet Take 1 tablet by mouth every 12 (twelve) hours. 14 tablet Rhys Martini, PA-C   docusate sodium (COLACE) 100 MG capsule Take 1 capsule (100 mg total) by mouth every 12 (twelve) hours. 60 capsule Rhys Martini, PA-C     PDMP not reviewed this encounter.   Rhys Martini, PA-C 12/17/20 2043    Rhys Martini, PA-C 12/18/20 [phone]

## 2020-12-17 NOTE — ED Triage Notes (Signed)
Pt presents with laceration to left arm. Pt states she cut herself with a machine at work last night. She states she tried the hydrogen peroxide method and states it did not help heal.

## 2020-12-27 ENCOUNTER — Ambulatory Visit: Payer: Self-pay | Admitting: Nurse Practitioner

## 2021-04-04 ENCOUNTER — Other Ambulatory Visit: Payer: Self-pay

## 2021-04-04 ENCOUNTER — Encounter: Payer: Self-pay | Admitting: Nurse Practitioner

## 2021-04-04 ENCOUNTER — Ambulatory Visit: Payer: Self-pay | Attending: Nurse Practitioner | Admitting: Nurse Practitioner

## 2021-04-04 DIAGNOSIS — Z130 Encounter for screening for diseases of the blood and blood-forming organs and certain disorders involving the immune mechanism: Secondary | ICD-10-CM

## 2021-04-04 DIAGNOSIS — Z131 Encounter for screening for diabetes mellitus: Secondary | ICD-10-CM

## 2021-04-04 DIAGNOSIS — Z1159 Encounter for screening for other viral diseases: Secondary | ICD-10-CM

## 2021-04-04 DIAGNOSIS — I10 Essential (primary) hypertension: Secondary | ICD-10-CM

## 2021-04-04 DIAGNOSIS — Z13228 Encounter for screening for other metabolic disorders: Secondary | ICD-10-CM

## 2021-04-04 DIAGNOSIS — Z1329 Encounter for screening for other suspected endocrine disorder: Secondary | ICD-10-CM

## 2021-04-04 DIAGNOSIS — Z7689 Persons encountering health services in other specified circumstances: Secondary | ICD-10-CM

## 2021-04-04 DIAGNOSIS — Z13220 Encounter for screening for lipoid disorders: Secondary | ICD-10-CM

## 2021-04-04 MED ORDER — HYDROCHLOROTHIAZIDE 12.5 MG PO CAPS
12.5000 mg | ORAL_CAPSULE | Freq: Every day | ORAL | 0 refills | Status: DC
  Filled 2021-04-04: qty 30, 30d supply, fill #0

## 2021-04-04 NOTE — Progress Notes (Signed)
Wants to get blood work done. HTN, not taking any medications.

## 2021-04-04 NOTE — Progress Notes (Signed)
Virtual Visit via Telephone Note Due to national recommendations of social distancing due to [place] 19, telehealth visit is felt to be most appropriate for this patient at this time.  I discussed the limitations, risks, security and privacy concerns of performing an evaluation and management service by telephone and the availability of in person appointments. I also discussed with the patient that there may be a patient responsible charge related to this service. The patient expressed understanding and agreed to proceed.    I connected with Carla Carson on 04/04/21  at   8:50 AM EDT  EDT by telephone and verified that I am speaking with the correct person using two identifiers.  Location of Patient: Private Residence   Location of Provider: [place] and [employer] Office    Persons participating in Telemedicine visit: Carla Rankins FNP-BC Carla Carson    History of Present Illness: Telemedicine visit for: Establish Care She denies any previous history of Thyroid disorder, HPL or DM  She has a history of HTN. Will start low dose HCTZ 12.5 mg daily today. Denies chest pain, shortness of breath, palpitations, lightheadedness, dizziness, headaches or BLE edema.   BP Readings from Last 3 Encounters:  12/17/20 102/90  10/07/20 (!) 137/94  08/15/20 130/85       Past Medical History:  Diagnosis Date   Chlamydia    Gonorrhea    Infection    UTI   Ovarian cyst    Preeclampsia    Syphilis     Past Surgical History:  Procedure Laterality Date   CESAREAN SECTION N/A 11/07/2015   Procedure: CESAREAN SECTION;  Surgeon: Lavonia Drafts, MD;  Location: [facility];  Service: Obstetrics;  Laterality: N/A;   TUBAL LIGATION Bilateral 11/26/2016   Procedure: POST PARTUM TUBAL LIGATION;  Surgeon: Chancy Milroy, MD;  Location: [facility];  Service: Gynecology;  Laterality: Bilateral;   WISDOM TOOTH EXTRACTION      Family History  Problem Relation Age of Onset   Heart disease  Mother        hole in heart   Cancer Maternal Grandmother     Social History   Socioeconomic History   Marital status: Single    Spouse name: Not on file   Number of children: Not on file   Years of education: Not on file   Highest education level: Not on file  Occupational History   Not on file  Tobacco Use   Smoking status: Every Day    Packs/day: 1.00    Pack years: 0.00    Types: Cigarettes   Smokeless tobacco: Never  Vaping Use   Vaping Use: Never used  Substance and Sexual Activity   Alcohol use: No   Drug use: Yes    Types: Marijuana    Comment: once in a blue moon   Sexual activity: Yes    Birth control/protection: None  Other Topics Concern   Not on file  Social History Narrative   ** Merged History Encounter **       Social Determinants of Health   Financial Resource Strain: Not on file  Food Insecurity: Not on file  Transportation Needs: Not on file  Physical Activity: Not on file  Stress: Not on file  Social Connections: Not on file     Observations/Objective: Awake, alert and oriented x 3   Review of Systems  Constitutional:  Negative for fever, malaise/fatigue and weight loss.  HENT: Negative.  Negative for nosebleeds.   Eyes: Negative.  Negative for  blurred vision, double vision and photophobia.  Respiratory: Negative.  Negative for cough and shortness of breath.   Cardiovascular: Negative.  Negative for chest pain, palpitations and leg swelling.  Gastrointestinal: Negative.  Negative for heartburn, nausea and vomiting.  Musculoskeletal: Negative.  Negative for myalgias.  Neurological: Negative.  Negative for dizziness, focal weakness, seizures and headaches.  Psychiatric/Behavioral: Negative.  Negative for suicidal ideas.    Assessment and Plan: [name] was seen today for establish care.  Diagnoses and all orders for this visit:  Encounter to establish care  Primary hypertension -     hydrochlorothiazide (MICROZIDE) 12.5 MG capsule;  Take 1 capsule (12.5 mg total) by mouth daily. -     TSH; Future -     CMP14+EGFR; Future Continue all antihypertensives as prescribed.  Remember to bring in your blood pressure log with you for your follow up appointment.  DASH/Mediterranean Diets are healthier choices for HTN.    Lipid screening -     Lipid panel; Future  Thyroid disorder screening -     TSH; Future  Screening for metabolic disorder -     STM19+QQIW; Future  Screening for deficiency anemia -     CBC; Future  Encounter for screening for diabetes mellitus -     Hemoglobin A1c; Future  Need for hepatitis C screening test -     HCV Ab w Reflex to Quant PCR; Future    Follow Up Instructions Return for see luke in 2-3 weeks BP check.  See me for PAP SMEAR.     I discussed the assessment and treatment plan with the patient. The patient was provided an opportunity to ask questions and all were answered. The patient agreed with the plan and demonstrated an understanding of the instructions.   The patient was advised to call back or seek an in-person evaluation if the symptoms worsen or if the condition fails to improve as anticipated.  I provided 14 minutes of non-face-to-face time during this encounter including median intraservice time, reviewing previous notes, labs, imaging, medications and explaining diagnosis and management.  Gildardo Pounds, FNP-BC

## 2021-04-04 NOTE — Progress Notes (Signed)
No answer. LVM to return call to office 

## 2021-04-09 ENCOUNTER — Ambulatory Visit: Payer: Self-pay | Attending: Nurse Practitioner

## 2021-04-09 ENCOUNTER — Other Ambulatory Visit: Payer: Self-pay

## 2021-04-09 DIAGNOSIS — Z130 Encounter for screening for diseases of the blood and blood-forming organs and certain disorders involving the immune mechanism: Secondary | ICD-10-CM

## 2021-04-09 DIAGNOSIS — Z131 Encounter for screening for diabetes mellitus: Secondary | ICD-10-CM

## 2021-04-09 DIAGNOSIS — I10 Essential (primary) hypertension: Secondary | ICD-10-CM

## 2021-04-09 DIAGNOSIS — Z13220 Encounter for screening for lipoid disorders: Secondary | ICD-10-CM

## 2021-04-09 DIAGNOSIS — Z1329 Encounter for screening for other suspected endocrine disorder: Secondary | ICD-10-CM

## 2021-04-09 DIAGNOSIS — Z1159 Encounter for screening for other viral diseases: Secondary | ICD-10-CM

## 2021-04-09 DIAGNOSIS — Z13228 Encounter for screening for other metabolic disorders: Secondary | ICD-10-CM

## 2021-04-10 LAB — CMP14+EGFR
ALT: 14 IU/L (ref 0–32)
AST: 15 IU/L (ref 0–40)
Albumin/Globulin Ratio: 1.1 — ABNORMAL LOW (ref 1.2–2.2)
Albumin: 4 g/dL (ref 3.8–4.8)
Alkaline Phosphatase: 85 IU/L (ref 44–121)
BUN/Creatinine Ratio: 11 (ref 9–23)
BUN: 9 mg/dL (ref 6–20)
Bilirubin Total: <0.2 mg/dL (ref 0.0–1.2)
CO2: 24 mmol/L (ref 20–29)
Calcium: 9.3 mg/dL (ref 8.7–10.2)
Chloride: 103 mmol/L (ref 96–106)
Creatinine, Ser: 0.85 mg/dL (ref 0.57–1.00)
Globulin, Total: 3.5 g/dL (ref 1.5–4.5)
Glucose: 85 mg/dL (ref 65–99)
Potassium: 4.2 mmol/L (ref 3.5–5.2)
Sodium: 140 mmol/L (ref 134–144)
Total Protein: 7.5 g/dL (ref 6.0–8.5)
eGFR: 93 mL/min/{1.73_m2} (ref >59)

## 2021-04-10 LAB — CBC
Hematocrit: 35.5 % (ref 34.0–46.6)
Hemoglobin: 11.2 g/dL (ref 11.1–15.9)
MCH: 25.2 pg — ABNORMAL LOW (ref 26.6–33.0)
MCHC: 31.5 g/dL (ref 31.5–35.7)
MCV: 80 fL (ref 79–97)
Platelets: 433 10*3/uL (ref 150–450)
RBC: 4.45 x10E6/uL (ref 3.77–5.28)
RDW: 15.7 % — ABNORMAL HIGH (ref 11.7–15.4)
WBC: 10.5 10*3/uL (ref 3.4–10.8)

## 2021-04-10 LAB — HCV AB W REFLEX TO QUANT PCR: HCV Ab: <0.1 s/co ratio (ref 0.0–0.9)

## 2021-04-10 LAB — HEMOGLOBIN A1C
Est. average glucose Bld gHb Est-mCnc: 111 mg/dL
Hgb A1c MFr Bld: 5.5 % (ref 4.8–5.6)

## 2021-04-10 LAB — LIPID PANEL
Chol/HDL Ratio: 4 ratio (ref 0.0–4.4)
Cholesterol, Total: 172 mg/dL (ref 100–199)
HDL: 43 mg/dL (ref >39)
LDL Chol Calc (NIH): 110 mg/dL — ABNORMAL HIGH (ref 0–99)
Triglycerides: 101 mg/dL (ref 0–149)
VLDL Cholesterol Cal: 19 mg/dL (ref 5–40)

## 2021-04-10 LAB — HCV INTERPRETATION

## 2021-04-10 LAB — TSH: TSH: 1.61 u[IU]/mL (ref 0.450–4.500)

## 2021-04-18 ENCOUNTER — Encounter: Payer: Self-pay | Admitting: *Deleted

## 2021-04-24 ENCOUNTER — Ambulatory Visit: Payer: Self-pay | Attending: Nurse Practitioner | Admitting: Pharmacist

## 2021-04-24 ENCOUNTER — Encounter: Payer: Self-pay | Admitting: Pharmacist

## 2021-04-24 ENCOUNTER — Other Ambulatory Visit: Payer: Self-pay

## 2021-04-24 VITALS — BP 116/84

## 2021-04-24 DIAGNOSIS — I10 Essential (primary) hypertension: Secondary | ICD-10-CM

## 2021-04-24 NOTE — Progress Notes (Signed)
   S:    PCP: Zelda   Patient arrives in good spirits. Presents to the clinic for hypertension evaluation, counseling, and management. Patient was referred and last seen by Primary Care Provider on 04/04/2021. That visit occurred via telemedicine. HCTZ 12.5 mg was started d/t her hx of HTN.   Medication adherence reported. Denies any side effects to the HCTZ.   Denies chest pain/dyspnea/HA/blurred vision. Does endorse infrequent HA that resolves with rest. She tries not to take any OTC medications. Location: right side of her head. Denies any NV, photophobia. No pain behind her eyes or forehead. No pain in the back of her head. Denies any lightheadedness or dizziness.   Current BP Medications include:  HCTZ 12.5 mg daily  Dietary habits include: has changed her diet. Still adds salt to foods but has eliminated fast foods. Has incorporated more fresh fruits and veggies into her diet. Drinks coffee daily. Trying to eliminate sodas but has not done so completely yet.  Exercise habits include: walks 30 minutes, 7 days weekly  Family / Social history:  -Fhx: heart disease -Tobacco: current 1 PPD smoker -Alcohol: socially   O:  Vitals:   04/24/21 0932  BP: 116/84   Home BP readings: has a cuff but no readings   Last 3 Office BP readings: BP Readings from Last 3 Encounters:  04/24/21 116/84  12/17/20 102/90  10/07/20 (!) 137/94    BMET    Component Value Date/Time   NA 140 04/09/2021 1425   K 4.2 04/09/2021 1425   CL 103 04/09/2021 1425   CO2 24 04/09/2021 1425   GLUCOSE 85 04/09/2021 1425   GLUCOSE 81 05/23/2018 2141   BUN 9 04/09/2021 1425   CREATININE 0.85 04/09/2021 1425   CREATININE 0.64 07/16/2016 1034   CALCIUM 9.3 04/09/2021 1425   GFRNONAA >60 05/23/2018 2141   GFRAA >60 05/23/2018 2141    Renal function: CrCl cannot be calculated (Unknown ideal weight.).  Clinical ASCVD: No  The ASCVD Risk score Denman George DC Jr., et al., 2013) failed to calculate for the following  reasons:   The 2013 ASCVD risk score is only valid for ages 69 to 71   A/P: Hypertension longstanding improved on HCTZ. BP Goal = < 130/80 mmHg. Medication adherence reported. No med changes today. Advised her to take home BP and record her numbers. She will bring me these in 1 month. Advised OTC Tylenol for HA. She was advised to try and avoid NSAIDs.  -Continued HCTZ 12.5 mg daily. -Counseled on lifestyle modifications for blood pressure control including reduced dietary sodium, increased exercise, adequate sleep.  Results reviewed and written information provided. Total time in face-to-face counseling 30 minutes.   F/U Clinic Visit in 1 month.  Butch Penny, PharmD, Patsy [place], CPP Clinical Pharmacist [facility] & [facility] [phone]

## 2021-05-10 ENCOUNTER — Other Ambulatory Visit: Payer: Self-pay | Admitting: Nurse Practitioner

## 2021-05-10 DIAGNOSIS — I10 Essential (primary) hypertension: Secondary | ICD-10-CM

## 2021-05-10 NOTE — Telephone Encounter (Signed)
Requested medication (s) are due for refill today: yes  Requested medication (s) are on the active medication list: expired 05/09/21  Last refill:  04/04/21  Future visit scheduled: yes  Notes to clinic:  med expired   Requested Prescriptions  Pending Prescriptions Disp Refills   hydrochlorothiazide (MICROZIDE) 12.5 MG capsule 30 capsule 0    Sig: Take 1 capsule (12.5 mg total) by mouth daily.      Cardiovascular: Diuretics - Thiazide Passed - 05/10/2021 12:27 PM      Passed - Ca in normal range and within 360 days    Calcium  Date Value Ref Range Status  04/09/2021 9.3 8.7 - 10.2 mg/dL Final   Calcium, Ion  Date Value Ref Range Status  11/11/2016 1.15 1.15 - 1.40 mmol/L Final          Passed - Cr in normal range and within 360 days    Creat  Date Value Ref Range Status  07/16/2016 0.64 0.50 - 1.10 mg/dL Final   Creatinine, Ser  Date Value Ref Range Status  04/09/2021 0.85 0.57 - 1.00 mg/dL Final   Creatinine, Urine  Date Value Ref Range Status  11/26/2016 181.00 mg/dL Final          Passed - K in normal range and within 360 days    Potassium  Date Value Ref Range Status  04/09/2021 4.2 3.5 - 5.2 mmol/L Final          Passed - Na in normal range and within 360 days    Sodium  Date Value Ref Range Status  04/09/2021 140 134 - 144 mmol/L Final          Passed - Last BP in normal range    BP Readings from Last 1 Encounters:  04/24/21 116/84          Passed - Valid encounter within last 6 months    Recent Outpatient Visits           2 weeks ago Primary hypertension   [facility] Drucilla Chalet, RPH-CPP   1 month ago Encounter to establish care   [facility] Claiborne Rigg, NP       Future Appointments             In 1 week Lois Huxley, Cornelius Moras, RPH-CPP [facility]   In 2 weeks Claiborne Rigg, NP [facility]

## 2021-05-12 ENCOUNTER — Other Ambulatory Visit: Payer: Self-pay

## 2021-05-12 MED ORDER — HYDROCHLOROTHIAZIDE 12.5 MG PO CAPS
12.5000 mg | ORAL_CAPSULE | Freq: Every day | ORAL | 1 refills | Status: DC
  Filled 2021-05-12: qty 30, 30d supply, fill #0

## 2021-05-15 ENCOUNTER — Other Ambulatory Visit: Payer: Self-pay

## 2021-05-22 ENCOUNTER — Ambulatory Visit: Payer: Self-pay | Admitting: Pharmacist

## 2021-05-27 ENCOUNTER — Other Ambulatory Visit: Payer: Self-pay

## 2021-05-27 ENCOUNTER — Ambulatory Visit: Payer: Self-pay | Attending: Nurse Practitioner | Admitting: Nurse Practitioner

## 2021-05-27 ENCOUNTER — Encounter: Payer: Self-pay | Admitting: Nurse Practitioner

## 2021-05-27 ENCOUNTER — Other Ambulatory Visit (HOSPITAL_COMMUNITY)
Admission: RE | Admit: 2021-05-27 | Discharge: 2021-05-27 | Disposition: A | Discharge status: Home / Self Care | Payer: Self-pay | Source: Ambulatory Visit | Attending: Nurse Practitioner | Admitting: Nurse Practitioner

## 2021-05-27 VITALS — BP 97/66 | HR 95 | Ht 63.0 in | Wt 260.0 lb

## 2021-05-27 DIAGNOSIS — Z124 Encounter for screening for malignant neoplasm of cervix: Secondary | ICD-10-CM

## 2021-05-27 DIAGNOSIS — Z114 Encounter for screening for human immunodeficiency virus [HIV]: Secondary | ICD-10-CM

## 2021-05-27 DIAGNOSIS — Z1231 Encounter for screening mammogram for malignant neoplasm of breast: Secondary | ICD-10-CM

## 2021-05-27 NOTE — Progress Notes (Signed)
Assessment & Plan:  Carla Carson was seen today for gynecologic exam.  Diagnoses and all orders for this visit:  Encounter for Papanicolaou smear for cervical cancer screening -     Cytology - PAP -     Cervicovaginal ancillary only  Encounter for screening for HIV -     HIV antibody (with reflex)  Breast cancer screening by mammogram -     MM DIGITAL SCREENING BILATERAL; Future   Patient has been counseled on age-appropriate routine health concerns for screening and prevention. These are reviewed and up-to-date. Referrals have been placed accordingly. Immunizations are up-to-date or declined.    Subjective:   Chief Complaint  Patient presents with   Gynecologic Exam   Gynecologic Exam Pertinent negatives include no abdominal pain, chills, fever, flank pain or rash.  Carla Carson 35 y.o. female presents to office today for PAP smear. Blood pressure is borderline low today. Will stop HCTZ 12.5mg  and she will return in 4 weeks for BP check.  Denies chest pain, shortness of breath, palpitations, lightheadedness, dizziness, headaches or BLE edema.   BP Readings from Last 3 Encounters:  05/27/21 97/66  04/24/21 116/84  12/17/20 102/90    Review of Systems  Constitutional: Negative.  Negative for chills, fever, malaise/fatigue and weight loss.  Respiratory: Negative.  Negative for cough, shortness of breath and wheezing.   Cardiovascular: Negative.  Negative for chest pain, orthopnea and leg swelling.  Gastrointestinal:  Negative for abdominal pain.  Genitourinary: Negative.  Negative for flank pain.  Skin: Negative.  Negative for rash.  Psychiatric/Behavioral:  Negative for suicidal ideas.    Past Medical History:  Diagnosis Date   Chlamydia    Gonorrhea    Infection    UTI   Ovarian cyst    Preeclampsia    Syphilis     Past Surgical History:  Procedure Laterality Date   CESAREAN SECTION N/A 11/07/2015   Procedure: CESAREAN SECTION;  Surgeon: Willodean Rosenthal,  MD;  Location: [facility];  Service: Obstetrics;  Laterality: N/A;   TUBAL LIGATION Bilateral 11/26/2016   Procedure: POST PARTUM TUBAL LIGATION;  Surgeon: Hermina Staggers, MD;  Location: [facility];  Service: Gynecology;  Laterality: Bilateral;   WISDOM TOOTH EXTRACTION      Family History  Problem Relation Age of Onset   Heart disease Mother        hole in heart   Cancer Maternal Grandmother     Social History Reviewed with no changes to be made today.   Outpatient Medications Prior to Visit  Medication Sig Dispense Refill   hydrochlorothiazide (MICROZIDE) 12.5 MG capsule Take 1 capsule (12.5 mg total) by mouth daily. 30 capsule 1   No facility-administered medications prior to visit.    Allergies  Allergen Reactions   Nubain [Nalbuphine Hcl] Itching       Objective:    BP 97/66   Pulse 95   Ht 5\' 3"  (1.6 m)   Wt 260 lb (117.9 kg)   SpO2 99%   BMI 46.06 kg/m  Wt Readings from Last 3 Encounters:  05/27/21 260 lb (117.9 kg)  08/15/20 263 lb (119.3 kg)  11/25/16 260 lb (117.9 kg)    Physical Exam Exam conducted with a chaperone present.  Constitutional:      Appearance: She is well-developed.  HENT:     Head: Normocephalic.  Cardiovascular:     Rate and Rhythm: Normal rate and regular rhythm.     Heart sounds: Normal heart sounds.  Pulmonary:  Effort: Pulmonary effort is normal.     Breath sounds: Normal breath sounds.  Abdominal:     General: Bowel sounds are normal.     Palpations: Abdomen is soft.     Hernia: There is no hernia in the left inguinal area.  Genitourinary:    Exam position: Lithotomy position.     Labia:        Right: No rash, tenderness, lesion or injury.        Left: No rash, tenderness, lesion or injury.      Vagina: Normal. No signs of injury and foreign body. No vaginal discharge, erythema, tenderness or bleeding.     Cervix: No cervical motion tenderness or friability.     Uterus: Not deviated and not enlarged.      Adnexa:         Right: No mass, tenderness or fullness.         Left: No mass, tenderness or fullness.       Rectum: Normal. No external hemorrhoid.  Lymphadenopathy:     Lower Body: No right inguinal adenopathy. No left inguinal adenopathy.  Skin:    General: Skin is warm and dry.  Neurological:     Mental Status: She is alert and oriented to person, place, and time.  Psychiatric:        Behavior: Behavior normal.        Thought Content: Thought content normal.        Judgment: Judgment normal.         Patient has been counseled extensively about nutrition and exercise as well as the importance of adherence with medications and regular follow-up. The patient was given clear instructions to go to ER or return to medical center if symptoms don't improve, worsen or new problems develop. The patient verbalized understanding.   Follow-up: No follow-ups on file.   Claiborne Rigg, FNP-BC [facility] and [facility] [place], [place] [phone]   05/27/2021, 3:04 PM

## 2021-05-28 ENCOUNTER — Other Ambulatory Visit: Payer: Self-pay | Admitting: Nurse Practitioner

## 2021-05-28 LAB — CERVICOVAGINAL ANCILLARY ONLY
Bacterial Vaginitis (gardnerella): POSITIVE — AB
Candida Glabrata: NEGATIVE
Candida Vaginitis: NEGATIVE
Chlamydia: NEGATIVE
Comment: NEGATIVE
Comment: NEGATIVE
Comment: NEGATIVE
Comment: NEGATIVE
Comment: NEGATIVE
Comment: NORMAL
Neisseria Gonorrhea: NEGATIVE
Trichomonas: NEGATIVE

## 2021-05-28 LAB — CYTOLOGY - PAP
Adequacy: ABSENT
Comment: NEGATIVE
Diagnosis: NEGATIVE
High risk HPV: NEGATIVE

## 2021-05-28 LAB — HIV ANTIBODY (ROUTINE TESTING W REFLEX): HIV Screen 4th Generation wRfx: NONREACTIVE

## 2021-05-28 MED ORDER — METRONIDAZOLE 500 MG PO TABS
500.0000 mg | ORAL_TABLET | Freq: Two times a day (BID) | ORAL | 0 refills | Status: AC
  Filled 2021-05-28: qty 14, 7d supply, fill #0

## 2021-05-29 ENCOUNTER — Other Ambulatory Visit: Payer: Self-pay

## 2021-06-03 ENCOUNTER — Ambulatory Visit: Payer: Self-pay | Admitting: Pharmacist

## 2021-06-06 ENCOUNTER — Other Ambulatory Visit: Payer: Self-pay

## 2021-06-24 ENCOUNTER — Encounter: Payer: Self-pay | Admitting: Pharmacist

## 2021-06-24 ENCOUNTER — Ambulatory Visit: Payer: Self-pay | Attending: Nurse Practitioner | Admitting: Pharmacist

## 2021-06-24 ENCOUNTER — Other Ambulatory Visit: Payer: Self-pay

## 2021-06-24 VITALS — BP 109/74

## 2021-06-24 DIAGNOSIS — I10 Essential (primary) hypertension: Secondary | ICD-10-CM

## 2021-06-24 NOTE — Progress Notes (Signed)
   S:    PCP: Zelda   Patient arrives in good spirits. Presents to the clinic for hypertension evaluation, counseling, and management. Patient was referred and last seen by Primary Care Provider on 05/27/2021. HCTZ was stopped as pt's BP was borderline low.   Medication adherence: recently stopped HCTZ d/t hypotension. Does not currently take BP medications.  Denies chest pain/dyspnea/HA/blurred vision.   Current BP Medications include:  none  Dietary habits include: has changed her diet. Has decreased amount of salt in her diet. Increasing amounts of fish, vegetables, and tofu. Drinks coffee daily. Trying to eliminate sodas but has not done so completely yet.  Exercise habits include: walks 30 minutes, 7 days weekly  Family / Social history:  -Fhx: heart disease -Tobacco: current 1 PPD smoker (trying to cut back as well) -Alcohol: socially   O:  Vitals:   06/24/21 1420  BP: 109/74   Home BP readings: has a cuff but no readings   Last 3 Office BP readings: BP Readings from Last 3 Encounters:  06/24/21 109/74  05/27/21 97/66  04/24/21 116/84    BMET    Component Value Date/Time   NA 140 04/09/2021 1425   K 4.2 04/09/2021 1425   CL 103 04/09/2021 1425   CO2 24 04/09/2021 1425   GLUCOSE 85 04/09/2021 1425   GLUCOSE 81 05/23/2018 2141   BUN 9 04/09/2021 1425   CREATININE 0.85 04/09/2021 1425   CREATININE 0.64 07/16/2016 1034   CALCIUM 9.3 04/09/2021 1425   GFRNONAA >60 05/23/2018 2141   GFRAA >60 05/23/2018 2141    Renal function: CrCl cannot be calculated (Patient's most recent lab result is older than the maximum 21 days allowed.).  Clinical ASCVD: No  The ASCVD Risk score Denman George DC Jr., et al., 2013) failed to calculate for the following reasons:   The 2013 ASCVD risk score is only valid for ages 43 to 75   A/P: Hypertension longstanding at goal without medications. BP Goal = < 130/80 mmHg. No need to start medications today.  -Counseled on lifestyle  modifications for blood pressure control including reduced dietary sodium, increased exercise, adequate sleep.  Results reviewed and written information provided. Total time in face-to-face counseling 30 minutes.  F/U Clinic Visit in Nov with PCP.   Butch Penny, PharmD, Patsy [place], CPP Clinical Pharmacist [facility] & [facility] [phone]

## 2021-07-31 ENCOUNTER — Other Ambulatory Visit: Payer: Self-pay | Admitting: Nurse Practitioner

## 2021-08-27 ENCOUNTER — Ambulatory Visit: Payer: Self-pay | Admitting: Nurse Practitioner

## 2022-01-14 ENCOUNTER — Emergency Department (HOSPITAL_COMMUNITY)
Admission: EM | Admit: 2022-01-14 | Discharge: 2022-01-14 | Disposition: A | Discharge status: Home / Self Care | Payer: No Typology Code available for payment source | Attending: Emergency Medicine | Admitting: Emergency Medicine

## 2022-01-14 ENCOUNTER — Emergency Department (HOSPITAL_COMMUNITY): Payer: No Typology Code available for payment source

## 2022-01-14 ENCOUNTER — Other Ambulatory Visit: Payer: Self-pay

## 2022-01-14 DIAGNOSIS — S20212A Contusion of left front wall of thorax, initial encounter: Secondary | ICD-10-CM | POA: Insufficient documentation

## 2022-01-14 DIAGNOSIS — Y9301 Activity, walking, marching and hiking: Secondary | ICD-10-CM | POA: Insufficient documentation

## 2022-01-14 DIAGNOSIS — S50812A Abrasion of left forearm, initial encounter: Secondary | ICD-10-CM | POA: Insufficient documentation

## 2022-01-14 DIAGNOSIS — R1012 Left upper quadrant pain: Secondary | ICD-10-CM | POA: Diagnosis not present

## 2022-01-14 DIAGNOSIS — Y92410 Unspecified street and highway as the place of occurrence of the external cause: Secondary | ICD-10-CM | POA: Diagnosis not present

## 2022-01-14 DIAGNOSIS — S8002XA Contusion of left knee, initial encounter: Secondary | ICD-10-CM | POA: Diagnosis not present

## 2022-01-14 DIAGNOSIS — S50811A Abrasion of right forearm, initial encounter: Secondary | ICD-10-CM | POA: Insufficient documentation

## 2022-01-14 DIAGNOSIS — S8992XA Unspecified injury of left lower leg, initial encounter: Secondary | ICD-10-CM | POA: Diagnosis present

## 2022-01-14 LAB — I-STAT CHEM 8, ED
BUN: 8 mg/dL (ref 6–20)
Calcium, Ion: 1.17 mmol/L (ref 1.15–1.40)
Chloride: 103 mmol/L (ref 98–111)
Creatinine, Ser: 0.9 mg/dL (ref 0.44–1.00)
Glucose, Bld: 117 mg/dL — ABNORMAL HIGH (ref 70–99)
HCT: 41 % (ref 36.0–46.0)
Hemoglobin: 13.9 g/dL (ref 12.0–15.0)
Potassium: 3.7 mmol/L (ref 3.5–5.1)
Sodium: 140 mmol/L (ref 135–145)
TCO2: 27 mmol/L (ref 22–32)

## 2022-01-14 LAB — ETHANOL: Alcohol, Ethyl (B): <10 mg/dL (ref <10)

## 2022-01-14 LAB — COMPREHENSIVE METABOLIC PANEL
ALT: 22 U/L (ref 0–44)
AST: 22 U/L (ref 15–41)
Albumin: 3.7 g/dL (ref 3.5–5.0)
Alkaline Phosphatase: 69 U/L (ref 38–126)
Anion gap: 9 (ref 5–15)
BUN: 7 mg/dL (ref 6–20)
CO2: 24 mmol/L (ref 22–32)
Calcium: 9.1 mg/dL (ref 8.9–10.3)
Chloride: 105 mmol/L (ref 98–111)
Creatinine, Ser: 0.95 mg/dL (ref 0.44–1.00)
GFR, Estimated: >60 mL/min (ref >60)
Glucose, Bld: 119 mg/dL — ABNORMAL HIGH (ref 70–99)
Potassium: 3.7 mmol/L (ref 3.5–5.1)
Sodium: 138 mmol/L (ref 135–145)
Total Bilirubin: 0.3 mg/dL (ref 0.3–1.2)
Total Protein: 7.4 g/dL (ref 6.5–8.1)

## 2022-01-14 LAB — CBC
HCT: 43.1 % (ref 36.0–46.0)
Hemoglobin: 13.1 g/dL (ref 12.0–15.0)
MCH: 26 pg (ref 26.0–34.0)
MCHC: 30.4 g/dL (ref 30.0–36.0)
MCV: 85.5 fL (ref 80.0–100.0)
Platelets: 500 10*3/uL — ABNORMAL HIGH (ref 150–400)
RBC: 5.04 MIL/uL (ref 3.87–5.11)
RDW: 16.4 % — ABNORMAL HIGH (ref 11.5–15.5)
WBC: 10.8 10*3/uL — ABNORMAL HIGH (ref 4.0–10.5)
nRBC: 0.3 % — ABNORMAL HIGH (ref 0.0–0.2)

## 2022-01-14 LAB — SAMPLE TO BLOOD BANK

## 2022-01-14 LAB — PROTIME-INR
INR: 1.1 (ref 0.8–1.2)
Prothrombin Time: 13.9 seconds (ref 11.4–15.2)

## 2022-01-14 LAB — I-STAT BETA HCG BLOOD, ED (MC, WL, AP ONLY): I-stat hCG, quantitative: <5 m[IU]/mL (ref <5)

## 2022-01-14 LAB — LACTIC ACID, PLASMA: Lactic Acid, Venous: 1.6 mmol/L (ref 0.5–1.9)

## 2022-01-14 MED ORDER — ONDANSETRON HCL 4 MG/2ML IJ SOLN
INTRAMUSCULAR | Status: AC
  Administered 2022-01-14: 4 mg
  Filled 2022-01-14: qty 2

## 2022-01-14 MED ORDER — NAPROXEN 500 MG PO TABS: 500.0000 mg | ORAL_TABLET | Freq: Two times a day (BID) | ORAL | 0 refills | Status: DC

## 2022-01-14 MED ORDER — CYCLOBENZAPRINE HCL 10 MG PO TABS: 10.0000 mg | ORAL_TABLET | Freq: Two times a day (BID) | ORAL | 0 refills | Status: DC | PRN

## 2022-01-14 MED ORDER — FENTANYL CITRATE (PF) 100 MCG/2ML IJ SOLN
INTRAMUSCULAR | Status: AC
  Administered 2022-01-14: 100 ug
  Filled 2022-01-14: qty 2

## 2022-01-14 MED ORDER — CYCLOBENZAPRINE HCL 10 MG PO TABS
10.0000 mg | ORAL_TABLET | Freq: Once | ORAL | Status: AC
  Administered 2022-01-14: 10 mg via ORAL
  Filled 2022-01-14: qty 1

## 2022-01-14 MED ORDER — KETOROLAC TROMETHAMINE 15 MG/ML IJ SOLN
15.0000 mg | Freq: Once | INTRAMUSCULAR | Status: AC
  Administered 2022-01-14: 15 mg via INTRAVENOUS
  Filled 2022-01-14: qty 1

## 2022-01-14 MED ORDER — OXYCODONE-ACETAMINOPHEN 5-325 MG PO TABS
1.0000 | ORAL_TABLET | Freq: Once | ORAL | Status: AC
  Administered 2022-01-14: 1 via ORAL
  Filled 2022-01-14: qty 1

## 2022-01-14 MED ORDER — IOHEXOL 300 MG/ML  SOLN
100.0000 mL | Freq: Once | INTRAMUSCULAR | Status: AC | PRN
  Administered 2022-01-14: 100 mL via INTRAVENOUS

## 2022-01-14 MED ORDER — OXYCODONE-ACETAMINOPHEN 5-325 MG PO TABS
1.0000 | ORAL_TABLET | Freq: Four times a day (QID) | ORAL | 0 refills | Status: DC | PRN
Start: 2022-01-14 — End: 2022-11-24

## 2022-01-14 NOTE — Discharge Instructions (Signed)
On the CAT scans today there was no sign of collapsed lung or fractures of your ribs.  You just have a bad bruise which will be very sore over the next few days.  It is good to get up and move around so you do not get too stiff.  You can take the medications that we prescribed as needed for the discomfort.  Also heating pad may be helpful.  Return if your symptoms worsen or if you start having trouble breathing. ?
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
## 2022-01-14 NOTE — ED Notes (Signed)
Help get patient undressed on the monitor did manual blood pressure patient is resting with nurse at bedside ?

## 2022-01-16 LAB — I-STAT CHEM 8, ED
BUN: 10 mg/dL (ref 6–20)
Calcium, Ion: 1 mmol/L — ABNORMAL LOW (ref 1.15–1.40)
Chloride: 105 mmol/L (ref 98–111)
Creatinine, Ser: 0.9 mg/dL (ref 0.44–1.00)
Glucose, Bld: 104 mg/dL — ABNORMAL HIGH (ref 70–99)
HCT: 45 % (ref 36.0–46.0)
Hemoglobin: 15.3 g/dL — ABNORMAL HIGH (ref 12.0–15.0)
Potassium: 6.5 mmol/L (ref 3.5–5.1)
Sodium: 137 mmol/L (ref 135–145)
TCO2: 27 mmol/L (ref 22–32)

## 2022-02-09 ENCOUNTER — Ambulatory Visit: Payer: Self-pay | Admitting: Physician Assistant

## 2022-05-18 ENCOUNTER — Ambulatory Visit (HOSPITAL_COMMUNITY)
Admission: EM | Admit: 2022-05-18 | Discharge: 2022-05-18 | Disposition: A | Discharge status: Home / Self Care | Payer: Self-pay | Attending: Internal Medicine | Admitting: Internal Medicine

## 2022-05-18 ENCOUNTER — Encounter (HOSPITAL_COMMUNITY): Payer: Self-pay

## 2022-05-18 ENCOUNTER — Other Ambulatory Visit: Payer: Self-pay

## 2022-05-18 DIAGNOSIS — H5789 Other specified disorders of eye and adnexa: Secondary | ICD-10-CM

## 2022-05-18 DIAGNOSIS — L03213 Periorbital cellulitis: Secondary | ICD-10-CM

## 2022-05-18 MED ORDER — FLUORESCEIN SODIUM 1 MG OP STRP
ORAL_STRIP | OPHTHALMIC | Status: AC
  Filled 2022-05-18: qty 1

## 2022-05-18 MED ORDER — OFLOXACIN 0.3 % OP SOLN
1.0000 [drp] | Freq: Four times a day (QID) | OPHTHALMIC | 0 refills | Status: DC
  Filled 2022-05-18: qty 5, 18d supply, fill #0

## 2022-05-18 MED ORDER — AMOXICILLIN-POT CLAVULANATE 875-125 MG PO TABS
1.0000 | ORAL_TABLET | Freq: Two times a day (BID) | ORAL | 0 refills | Status: DC
  Filled 2022-05-18: qty 20, 10d supply, fill #0

## 2022-05-18 NOTE — ED Provider Notes (Signed)
[facility]    CSN: [id number] Arrival date & time: 05/18/22  1344      History   Chief Complaint Chief Complaint  Patient presents with   Eye Drainage    HPI Kornelia Guyon is a 36 y.o. female.   Patient presents today with a 3-day history of right eye swelling and discomfort.  She denies any ocular pain, visual disturbance, photophobia, nausea, vomiting, headache.  She does report pain is rated 6 on a 0-10 pain scale, described as aching, no aggravating alleviating factors identified.  She does not wear glasses or contacts.  Denies any ocular trauma.  She denies any recent illness or additional symptoms including cough, congestion, fever, nausea, vomiting.  She has not seen ophthalmologist recently.  Denies any recent antibiotics.  She has not tried any over-the-counter medication for symptom management.  Denies any exposure to chemicals or fine particular matter.    Past Medical History:  Diagnosis Date   Chlamydia    Gonorrhea    Infection    UTI   Ovarian cyst    Preeclampsia    Syphilis     Patient Active Problem List   Diagnosis Date Noted   Labor and delivery, indication for care 11/25/2016   Abdominal trauma 11/11/2016   Mental disorder 10/12/2016   Pregnancy, supervision, high-risk 07/16/2016   History of syphilis 07/16/2016   History of cesarean delivery 07/16/2016   Bipolar disease during pregnancy (HCC) 07/16/2016   BMI 45.0-49.9, adult (HCC) 07/16/2016   Obesity in pregnancy 07/16/2016   GBS (group B Streptococcus carrier), +RV culture, currently pregnant 07/16/2016   History of prior pregnancy with SGA newborn 07/16/2016   History of pre-eclampsia 11/07/2015    Past Surgical History:  Procedure Laterality Date   CESAREAN SECTION N/A 11/07/2015   Procedure: CESAREAN SECTION;  Surgeon: Willodean Rosenthal, MD;  Location: [facility];  Service: Obstetrics;  Laterality: N/A;   TUBAL LIGATION Bilateral 11/26/2016   Procedure: POST PARTUM TUBAL  LIGATION;  Surgeon: Hermina Staggers, MD;  Location: [facility];  Service: Gynecology;  Laterality: Bilateral;   WISDOM TOOTH EXTRACTION      OB History     Gravida  5   Para  5   Term  4   Preterm  1   AB  0   Living  4      SAB  0   IAB  0   Ectopic  0   Multiple  0   Live Births  4            Home Medications    Prior to Admission medications   Medication Sig Start Date End Date Taking? Authorizing Provider  amoxicillin-clavulanate (AUGMENTIN) 875-125 MG tablet Take 1 tablet by mouth every 12 (twelve) hours. 05/18/22  Yes Samer Dutton K, PA-C  ofloxacin (OCUFLOX) 0.3 % ophthalmic solution Place 1 drop into the left eye 4 (four) times daily. 05/18/22  Yes Arav Bannister K, PA-C  cyclobenzaprine (FLEXERIL) 10 MG tablet Take 1 tablet (10 mg total) by mouth 2 (two) times daily as needed for muscle spasms. 01/14/22   Gwyneth Sprout, MD  ibuprofen (ADVIL) 200 MG tablet Take 400 mg by mouth every 6 (six) hours as needed for cramping.    [provider]  naproxen (NAPROSYN) 500 MG tablet Take 1 tablet (500 mg total) by mouth 2 (two) times daily. 01/14/22   Gwyneth Sprout, MD  OXcarbazepine (TRILEPTAL) 150 MG tablet Take 150 mg by mouth 2 (two) times  daily. 12/15/21   [provider]  oxyCODONE-acetaminophen (PERCOCET/ROXICET) 5-325 MG tablet Take 1 tablet by mouth every 6 (six) hours as needed for severe pain. 01/14/22   Gwyneth Sprout, MD  SEROQUEL 50 MG tablet Take 50 mg by mouth at bedtime. 12/15/21   [provider]    Family History Family History  Problem Relation Age of Onset   Heart disease Mother        hole in heart   Cancer Maternal Grandmother     Social History Social History   Tobacco Use   Smoking status: Every Day    Packs/day: 1.00    Types: Cigarettes   Smokeless tobacco: Never  Vaping Use   Vaping Use: Never used  Substance Use Topics   Alcohol use: No   Drug use: Yes    Types: Marijuana    Comment: once in a  blue moon     Allergies   Nubain [nalbuphine hcl]   Review of Systems Review of Systems  Constitutional:  Positive for activity change. Negative for appetite change, fatigue and fever.  HENT:  Negative for congestion, sinus pressure, sneezing and sore throat.   Eyes:  Positive for discharge and redness. Negative for photophobia, pain, itching and visual disturbance.  Gastrointestinal:  Negative for abdominal pain, diarrhea, nausea and vomiting.  Neurological:  Negative for dizziness, light-headedness and headaches.     Physical Exam Triage Vital Signs ED Triage Vitals  Enc Vitals Group     BP 05/18/22 1429 131/85     Pulse Rate 05/18/22 1429 87     Resp 05/18/22 1429 18     Temp 05/18/22 1429 97.7 F (36.5 C)     Temp Source 05/18/22 1429 Oral     SpO2 05/18/22 1429 100 %     Weight --      Height --      Head Circumference --      Peak Flow --      Pain Score 05/18/22 1430 6     Pain Loc --      Pain Edu? --      Excl. in GC? --    No data found.  Updated Vital Signs BP 131/85 (BP Location: Left Arm)   Pulse 87   Temp 97.7 F (36.5 C) (Oral)   Resp 18   LMP 05/09/2022   SpO2 100%   Breastfeeding No   Visual Acuity Right Eye Distance: 20/40 Left Eye Distance: 20/20 Bilateral Distance: 20/25  Right Eye Near:   Left Eye Near:    Bilateral Near:     Physical Exam Vitals reviewed.  Constitutional:      General: She is awake. She is not in acute distress.    Appearance: Normal appearance. She is well-developed. She is not ill-appearing.     Comments: Very pleasant female appears stated age in no acute distress  HENT:     Head: Normocephalic and atraumatic.     Right Ear: External ear normal.     Left Ear: External ear normal.     Nose:     Right Sinus: No maxillary sinus tenderness or frontal sinus tenderness.     Left Sinus: No maxillary sinus tenderness or frontal sinus tenderness.  Eyes:     General: Lids are everted, no foreign bodies  appreciated.     Extraocular Movements: Extraocular movements intact.     Conjunctiva/sclera:     Right eye: Right conjunctiva is injected. No chemosis.  Left eye: Left conjunctiva is not injected. No chemosis.    Pupils: Pupils are equal, round, and reactive to light.     Right eye: No corneal abrasion or fluorescein uptake. Seidel exam negative.     Comments: Mild discomfort with extraocular movements of right eye.  Periorbital swelling noted.  Normal fluorescein stain.  No foreign body noted.  Cardiovascular:     Rate and Rhythm: Normal rate and regular rhythm.     Heart sounds: Normal heart sounds, S1 normal and S2 normal. No murmur heard. Pulmonary:     Effort: Pulmonary effort is normal.     Breath sounds: Normal breath sounds. No wheezing, rhonchi or rales.     Comments: Clear to auscultation bilaterally Psychiatric:        Behavior: Behavior is cooperative.      UC Treatments / Results  Labs (all labs ordered are listed, but only abnormal results are displayed) Labs Reviewed - No data to display  EKG   Radiology No results found.  Procedures Procedures (including critical care time)  Medications Ordered in UC Medications - No data to display  Initial Impression / Assessment and Plan / UC Course  I have reviewed the triage vital signs and the nursing notes.  Pertinent labs & imaging results that were available during my care of the patient were reviewed by me and considered in my medical decision making (see chart for details).     Patient is well-appearing, afebrile, nontoxic, nontachycardic.  Low suspicion for orbital cellulitis.  Concern for beginning of preseptal cellulitis given erythema and swelling of periorbital region and pain with extraocular movements.  We will start Augmentin twice daily for 10 days.  We will also start ofloxacin 4 drops in right eye.  Can use Tylenol ibuprofen for pain.  Recommended close follow-up with ophthalmology.  She was given  the contact information for local provider with instruction to call to schedule an appointment as soon as possible.  Discussed that if she has any worsening symptoms including ocular pain, fever, nausea/vomiting, visual disturbance, photophobia she needs to go to the emergency room immediately.  Strict return precautions given.  Work excuse note provided.  Final Clinical Impressions(s) / UC Diagnoses   Final diagnoses:  Eye swelling, right  Preseptal cellulitis of right eye     Discharge Instructions      I am concerned that you have an infection.  Start Augmentin twice daily for 10 days.  Use ofloxacin in your right eye.  Is importantly follow-up with an ophthalmologist as soon as possible.  Please call to schedule an appointment soon as you leave here.  If you have any worsening symptoms including visual change, eye pain, fever, nausea, vomiting, headache you need to go to the emergency room immediately.     ED Prescriptions     Medication Sig Dispense Auth. Provider   amoxicillin-clavulanate (AUGMENTIN) 875-125 MG tablet Take 1 tablet by mouth every 12 (twelve) hours. 20 tablet Ariyonna Twichell K, PA-C   ofloxacin (OCUFLOX) 0.3 % ophthalmic solution Place 1 drop into the left eye 4 (four) times daily. 5 mL Kaylena Pacifico K, PA-C      PDMP not reviewed this encounter.   Jeani Hawking, PA-C 05/18/22 1505

## 2022-05-18 NOTE — Discharge Instructions (Addendum)
I am concerned that you have an infection.  Start Augmentin twice daily for 10 days.  Use ofloxacin in your right eye.  Is importantly follow-up with an ophthalmologist as soon as possible.  Please call to schedule an appointment soon as you leave here.  If you have any worsening symptoms including visual change, eye pain, fever, nausea, vomiting, headache you need to go to the emergency room immediately.

## 2022-05-18 NOTE — ED Triage Notes (Signed)
Pt c/o rt eye redness, drainage, and pain x3 days.

## 2022-05-25 ENCOUNTER — Other Ambulatory Visit: Payer: Self-pay

## 2022-09-14 ENCOUNTER — Ambulatory Visit: Payer: Self-pay | Admitting: *Deleted

## 2022-09-14 NOTE — Telephone Encounter (Signed)
  Chief Complaint: Lower back pain Symptoms: BAck pain lower back, constant 8/10, shoulder pain "Mostly in mornings"Feet and hands numb at times Frequency: 2 months Pertinent Negatives: Patient denies redness, warmth,dysuria Disposition: [] ED /[] Urgent Care (no appt availability in office) / [] Appointment(In office/virtual)/ []  [place]/ [] Home Care/ [] Refused Recommended Disposition /[x] [facility]/ []  Follow-up with PCP Additional Notes: No availability, advised [facility], states will follow disposition. CAre advise provided, pt verbalizes understanding.

## 2022-09-14 NOTE — Telephone Encounter (Signed)
Reason for Disposition  [1] MODERATE back pain (e.g., interferes with normal activities) AND [2] present > 3 days  Answer Assessment - Initial Assessment Questions 1. ONSET: "When did the pain begin?"      2 months ago 2. LOCATION: "Where does it hurt?" (upper, mid or lower back)     Lower back 3. SEVERITY: "How bad is the pain?"  (e.g., Scale 1-10; mild, moderate, or severe)   - MILD (1-3): Doesn't interfere with normal activities.    - MODERATE (4-7): Interferes with normal activities or awakens from sleep.    - SEVERE (8-10): Excruciating pain, unable to do any normal activities.      8/10 4. PATTERN: "Is the pain constant?" (e.g., yes, no; constant, intermittent)      Constant. When lying down shoulders and lower back 5. RADIATION: "Does the pain shoot into your legs or somewhere else?"     Feet numb at times 6. CAUSE:  "What do you think is causing the back pain?"      Unsure 7. BACK OVERUSE:  "Any recent lifting of heavy objects, strenuous work or exercise?"     No 8. MEDICINES: "What have you taken so far for the pain?" (e.g., nothing, acetaminophen, NSAIDS)     Nothing 9. NEUROLOGIC SYMPTOMS: "Do you have any weakness, numbness, or problems with bowel/bladder control?"     Some numbness in feet and hands 10. OTHER SYMPTOMS: "Do you have any other symptoms?" (e.g., fever, abdomen pain, burning with urination, blood in urine)       no  Protocols used: Back Pain-A-AH

## 2022-09-14 NOTE — Telephone Encounter (Signed)
Noted  

## 2022-10-13 ENCOUNTER — Other Ambulatory Visit: Payer: Self-pay

## 2022-11-24 ENCOUNTER — Other Ambulatory Visit: Payer: Self-pay

## 2022-11-24 ENCOUNTER — Encounter: Payer: Self-pay | Admitting: Nurse Practitioner

## 2022-11-24 ENCOUNTER — Ambulatory Visit: Payer: Commercial Managed Care - HMO | Attending: Nurse Practitioner | Admitting: Nurse Practitioner

## 2022-11-24 VITALS — BP 136/85 | HR 86 | Ht 63.0 in | Wt 266.6 lb

## 2022-11-24 DIAGNOSIS — L732 Hidradenitis suppurativa: Secondary | ICD-10-CM | POA: Diagnosis not present

## 2022-11-24 DIAGNOSIS — Z0000 Encounter for general adult medical examination without abnormal findings: Secondary | ICD-10-CM

## 2022-11-24 DIAGNOSIS — Z0001 Encounter for general adult medical examination with abnormal findings: Secondary | ICD-10-CM | POA: Diagnosis not present

## 2022-11-24 DIAGNOSIS — F319 Bipolar disorder, unspecified: Secondary | ICD-10-CM

## 2022-11-24 DIAGNOSIS — Z6841 Body Mass Index (BMI) 40.0 and over, adult: Secondary | ICD-10-CM | POA: Diagnosis not present

## 2022-11-24 MED ORDER — CHLORHEXIDINE GLUCONATE 4 % EX LIQD
Freq: Every day | CUTANEOUS | 1 refills | Status: DC | PRN
  Filled 2022-11-24: qty 1000, fill #0

## 2022-11-24 NOTE — Progress Notes (Signed)
Assessment & Plan:  Carla Carson was seen today for annual exam.  Diagnoses and all orders for this visit:  Encounter for annual physical exam -     CMP14+EGFR -     CBC with Differential  Hidradenitis suppurativa -     chlorhexidine (HIBICLENS) 4 % external liquid; Apply topically daily as needed.  Bipolar disease during pregnancy, antepartum ([place]) Symptoms stable  BMI 45.0-49.9, adult ([place]) Discussed diet and exercise for person with BMI >47.  Instructions: You must burn more calories than you eat. Losing 5 percent of your body weight should be considered a success. In the longer term, losing more than 15 percent of your body weight and staying at this weight is an extremely good result. However, keep in mind that even losing 5 percent of your body weight leads to important health benefits, so try not to get discouraged if you're not able to lose more than this.    Patient has been counseled on age-appropriate routine health concerns for screening and prevention. These are reviewed and up-to-date. Referrals have been placed accordingly. Immunizations are up-to-date or declined.    Subjective:   Chief Complaint  Patient presents with   Annual Exam   HPI Carla Carson 37 y.o. female presents to office today for annual physical exam. Has a history of hidradenitis suppurativa. No flare at this time.     Review of Systems  Constitutional:  Negative for fever, malaise/fatigue and weight loss.  HENT: Negative.  Negative for nosebleeds.   Eyes: Negative.  Negative for blurred vision, double vision and photophobia.  Respiratory: Negative.  Negative for cough and shortness of breath.   Cardiovascular: Negative.  Negative for chest pain, palpitations and leg swelling.  Gastrointestinal: Negative.  Negative for heartburn, nausea and vomiting.  Genitourinary: Negative.   Musculoskeletal: Negative.  Negative for myalgias.  Skin: Negative.   Neurological: Negative.  Negative for  dizziness, focal weakness, seizures and headaches.  Endo/Heme/Allergies: Negative.   Psychiatric/Behavioral: Negative.  Negative for suicidal ideas.     Past Medical History:  Diagnosis Date   Chlamydia    Gonorrhea    Infection    UTI   Ovarian cyst    Preeclampsia    Syphilis     Past Surgical History:  Procedure Laterality Date   CESAREAN SECTION N/A 11/07/2015   Procedure: CESAREAN SECTION;  Surgeon: Lavonia Drafts, MD;  Location: [facility];  Service: Obstetrics;  Laterality: N/A;   TUBAL LIGATION Bilateral 11/26/2016   Procedure: POST PARTUM TUBAL LIGATION;  Surgeon: Chancy Milroy, MD;  Location: [facility];  Service: Gynecology;  Laterality: Bilateral;   WISDOM TOOTH EXTRACTION      Family History  Problem Relation Age of Onset   Heart disease Mother        hole in heart   Cancer Maternal Grandmother     Social History Reviewed with no changes to be made today.   Outpatient Medications Prior to Visit  Medication Sig Dispense Refill   amoxicillin-clavulanate (AUGMENTIN) 875-125 MG tablet Take 1 tablet by mouth every 12 (twelve) hours. (Patient not taking: Reported on 11/24/2022) 20 tablet 0   cyclobenzaprine (FLEXERIL) 10 MG tablet Take 1 tablet (10 mg total) by mouth 2 (two) times daily as needed for muscle spasms. (Patient not taking: Reported on 11/24/2022) 20 tablet 0   ibuprofen (ADVIL) 200 MG tablet Take 400 mg by mouth every 6 (six) hours as needed for cramping. (Patient not taking: Reported on 11/24/2022)  naproxen (NAPROSYN) 500 MG tablet Take 1 tablet (500 mg total) by mouth 2 (two) times daily. (Patient not taking: Reported on 11/24/2022) 20 tablet 0   ofloxacin (OCUFLOX) 0.3 % ophthalmic solution Place 1 drop into the left eye 4 (four) times daily. (Patient not taking: Reported on 11/24/2022) 5 mL 0   OXcarbazepine (TRILEPTAL) 150 MG tablet Take 150 mg by mouth 2 (two) times daily. (Patient not taking: Reported on 11/24/2022)     oxyCODONE-acetaminophen  (PERCOCET/ROXICET) 5-325 MG tablet Take 1 tablet by mouth every 6 (six) hours as needed for severe pain. (Patient not taking: Reported on 11/24/2022) 10 tablet 0   SEROQUEL 50 MG tablet Take 50 mg by mouth at bedtime. (Patient not taking: Reported on 11/24/2022)     No facility-administered medications prior to visit.    Allergies  Allergen Reactions   Nubain [Nalbuphine Hcl] Itching       Objective:    BP 136/85   Pulse 86   Ht 5\' 3"  (1.6 m)   Wt 266 lb 9.6 oz (120.9 kg)   LMP 11/24/2022 (Exact Date) Comment: Started 11/24/22  SpO2 99%   BMI 47.23 kg/m  Wt Readings from Last 3 Encounters:  11/24/22 266 lb 9.6 oz (120.9 kg)  01/14/22 275 lb (124.7 kg)  05/27/21 260 lb (117.9 kg)    Physical Exam Constitutional:      Appearance: She is well-developed.  HENT:     Head: Normocephalic and atraumatic.     Right Ear: Hearing, tympanic membrane, ear canal and external ear normal.     Left Ear: Hearing, tympanic membrane, ear canal and external ear normal.     Nose: Nose normal.     Right Turbinates: Not enlarged.     Left Turbinates: Not enlarged.     Mouth/Throat:     Lips: Pink.     Mouth: Mucous membranes are moist.     Dentition: No dental tenderness, gingival swelling, dental abscesses or gum lesions.     Pharynx: No oropharyngeal exudate.  Eyes:     General: No scleral icterus.       Right eye: No discharge.     Extraocular Movements: Extraocular movements intact.     Conjunctiva/sclera: Conjunctivae normal.     Pupils: Pupils are equal, round, and reactive to light.  Neck:     Thyroid: No thyromegaly.     Trachea: No tracheal deviation.  Cardiovascular:     Rate and Rhythm: Normal rate and regular rhythm.     Heart sounds: Normal heart sounds. No murmur heard.    No friction rub.  Pulmonary:     Effort: Pulmonary effort is normal. No accessory muscle usage or respiratory distress.     Breath sounds: Normal breath sounds. No decreased breath sounds, wheezing,  rhonchi or rales.  Abdominal:     General: Bowel sounds are normal. There is no distension.     Palpations: Abdomen is soft. There is no mass.     Tenderness: There is no abdominal tenderness. There is no right CVA tenderness, left CVA tenderness, guarding or rebound.     Hernia: No hernia is present.  Musculoskeletal:        General: No tenderness or deformity. Normal range of motion.     Cervical back: Normal range of motion and neck supple.  Lymphadenopathy:     Cervical: No cervical adenopathy.  Skin:    General: Skin is warm and dry.     Findings: No erythema.  Neurological:     Mental Status: She is alert and oriented to person, place, and time.     Cranial Nerves: No cranial nerve deficit.     Motor: Motor function is intact.     Coordination: Coordination is intact. Coordination normal.     Gait: Gait is intact.     Deep Tendon Reflexes:     Reflex Scores:      Patellar reflexes are 1+ on the right side and 1+ on the left side. Psychiatric:        Attention and Perception: Attention normal.        Mood and Affect: Mood normal.        Speech: Speech normal.        Behavior: Behavior normal.        Thought Content: Thought content normal.        Judgment: Judgment normal.          Patient has been counseled extensively about nutrition and exercise as well as the importance of adherence with medications and regular follow-up. The patient was given clear instructions to go to ER or return to medical center if symptoms don't improve, worsen or new problems develop. The patient verbalized understanding.   Follow-up: Return in about 1 year (around 11/25/2023) for physical.   Gildardo Pounds, FNP-BC [facility] and [facility] [place], [place]   11/27/2022, 8:03 PM

## 2022-11-25 LAB — CBC WITH DIFFERENTIAL/PLATELET
Basophils Absolute: 0.1 10*3/uL (ref 0.0–0.2)
Basos: 0 %
EOS (ABSOLUTE): 0.2 10*3/uL (ref 0.0–0.4)
Eos: 2 %
Hematocrit: 38.1 % (ref 34.0–46.6)
Hemoglobin: 12 g/dL (ref 11.1–15.9)
Immature Grans (Abs): 0 10*3/uL (ref 0.0–0.1)
Immature Granulocytes: 0 %
Lymphocytes Absolute: 4.4 10*3/uL — ABNORMAL HIGH (ref 0.7–3.1)
Lymphs: 37 %
MCH: 25.9 pg — ABNORMAL LOW (ref 26.6–33.0)
MCHC: 31.5 g/dL (ref 31.5–35.7)
MCV: 82 fL (ref 79–97)
Monocytes Absolute: 0.9 10*3/uL (ref 0.1–0.9)
Monocytes: 8 %
Neutrophils Absolute: 6.2 10*3/uL (ref 1.4–7.0)
Neutrophils: 53 %
Platelets: 482 10*3/uL — ABNORMAL HIGH (ref 150–450)
RBC: 4.63 x10E6/uL (ref 3.77–5.28)
RDW: 15.8 % — ABNORMAL HIGH (ref 11.7–15.4)
WBC: 11.8 10*3/uL — ABNORMAL HIGH (ref 3.4–10.8)

## 2022-11-25 LAB — CMP14+EGFR
ALT: 20 IU/L (ref 0–32)
AST: 15 IU/L (ref 0–40)
Albumin/Globulin Ratio: 1.2 (ref 1.2–2.2)
Albumin: 4.2 g/dL (ref 3.9–4.9)
Alkaline Phosphatase: 101 IU/L (ref 44–121)
BUN/Creatinine Ratio: 9 (ref 9–23)
BUN: 8 mg/dL (ref 6–20)
Bilirubin Total: 0.3 mg/dL (ref 0.0–1.2)
CO2: 22 mmol/L (ref 20–29)
Calcium: 9.4 mg/dL (ref 8.7–10.2)
Chloride: 102 mmol/L (ref 96–106)
Creatinine, Ser: 0.87 mg/dL (ref 0.57–1.00)
Globulin, Total: 3.4 g/dL (ref 1.5–4.5)
Glucose: 75 mg/dL (ref 70–99)
Potassium: 4.1 mmol/L (ref 3.5–5.2)
Sodium: 138 mmol/L (ref 134–144)
Total Protein: 7.6 g/dL (ref 6.0–8.5)
eGFR: 90 mL/min/{1.73_m2} (ref >59)

## 2022-11-27 ENCOUNTER — Encounter: Payer: Self-pay | Admitting: Nurse Practitioner

## 2023-04-09 ENCOUNTER — Ambulatory Visit: Payer: Medicaid Other | Admitting: Podiatry

## 2023-04-20 ENCOUNTER — Ambulatory Visit: Payer: Medicaid Other | Admitting: Podiatry

## 2023-05-04 ENCOUNTER — Other Ambulatory Visit: Payer: Self-pay

## 2023-05-04 ENCOUNTER — Ambulatory Visit (INDEPENDENT_AMBULATORY_CARE_PROVIDER_SITE_OTHER): Payer: Medicaid Other | Admitting: Podiatry

## 2023-05-04 ENCOUNTER — Encounter: Payer: Self-pay | Admitting: Podiatry

## 2023-05-04 ENCOUNTER — Ambulatory Visit (INDEPENDENT_AMBULATORY_CARE_PROVIDER_SITE_OTHER): Payer: Medicaid Other

## 2023-05-04 VITALS — BP 118/74 | HR 78

## 2023-05-04 DIAGNOSIS — M722 Plantar fascial fibromatosis: Secondary | ICD-10-CM

## 2023-05-04 DIAGNOSIS — B351 Tinea unguium: Secondary | ICD-10-CM | POA: Diagnosis not present

## 2023-05-04 MED ORDER — METHYLPREDNISOLONE 4 MG PO TBPK
ORAL_TABLET | ORAL | 0 refills | Status: DC
  Filled 2023-05-04: qty 21, 6d supply, fill #0

## 2023-05-05 NOTE — Progress Notes (Signed)
Chief Complaint  Patient presents with   Numbness    "When I wake up in the morning, my feet are numb.  My feet ache." N - numbness and pain in feet L - bilateral D - 5 years O - gradually worse C - numbness, throb, ache, sharp pain A - constant walking, hurts when I get up in the morning T - Eps[place] Salt bath   Nail Problem    "My toenail on my second toe is black." N - toenail L - 2nd right D - couple of years O - gradually worse C - black, thick, hard A - shoe T - none   HPI: 37 y.o. female presents today with concern of aching feet bilateral.  States that her feet often can feel numb first thing in the morning and she sometimes will need to limp around before she is able to move regularly.  As she notes by the end of the day the feet are incredibly sore.  She does not have 1 pinpoint location of tenderness.  It is more generalized in nature.  She also notes that her second toenail is black any thick and is requesting that be cut today.  Patient states that she is not sure if she is diabetic but notes her recent blood work was normal.  Past Medical History:  Diagnosis Date   Chlamydia    Gonorrhea    Infection    UTI   Ovarian cyst    Preeclampsia    Syphilis     Past Surgical History:  Procedure Laterality Date   CESAREAN SECTION N/A 11/07/2015   Procedure: CESAREAN SECTION;  Surgeon: Willodean Rosenthal, MD;  Location: [facility];  Service: Obstetrics;  Laterality: N/A;   TUBAL LIGATION Bilateral 11/26/2016   Procedure: POST PARTUM TUBAL LIGATION;  Surgeon: Hermina Staggers, MD;  Location: [facility];  Service: Gynecology;  Laterality: Bilateral;   WISDOM TOOTH EXTRACTION      Allergies  Allergen Reactions   Nubain [Nalbuphine Hcl] Itching     Physical Exam: Vitals:   05/04/23 1146  BP: 118/74  Pulse: 78    General: The patient is alert and oriented x3 in no acute distress.  Dermatology: Skin is warm, dry and supple bilateral lower extremities. Interspaces  are clear of maceration and debris.  Right second toenail is 3 mm thick with brown discoloration, subungual debris, distal onycholysis and pain with compression.  There is incurvation of the borders.  No signs of paronychia  Vascular: Palpable pedal pulses bilaterally. Capillary refill within normal limits.  No appreciable edema.  No erythema or calor.  Neurological: Light touch sensation grossly intact bilateral feet.  Negative Tinel's sign with percussion of the posterior tibial nerve bilateral.  Musculoskeletal Exam: There is hallux valgus with bunion deformity bilateral.  This is reducible.  There is significant flatfoot deformity bilateral.  Positive windlass mechanism bilateral.  Ankle dorsiflexion 8 degrees with the knee extended bilateral.  Manual muscle testing is 5/5 bilateral  Radiographic Exam (bilateral foot, 3 weightbearing views, 05/04/2023):  Normal osseous mineralization.  There is increased first intermetatarsal angle and increased hallux abductus angle bilateral.  Decreased calcaneal inclination angle seen on lateral view.  Small calcaneal spur noted.  No fractures are noted.  Assessment/Plan of Care: 1. Plantar fasciitis, bilateral   2. Onychomycosis      Meds ordered this encounter  Medications   methylPREDNISolone (MEDROL DOSEPAK) 4 MG TBPK tablet    Sig: Take as  directed    Dispense:  21 tablet    Refill:  0   Discussed clinical findings with patient today.  Patient would significantly benefit from custom orthotics to help improve function of both feet.  These can also help decrease pain and decreased overall achiness throughout the day with gait.  She was given a prescription for [employer] due to her insurance.  The right second toenail was debrided with sterile nail nippers to decrease bulk and length.  Recommended over-the-counter topical antifungal treatment to use for 3 to 4 months.  Follow-up as needed   Clerance Lav, DPM, FACFAS [facility]     [address] [place], [place] [address]                Office [phone]  Fax [phone]

## 2023-05-06 ENCOUNTER — Other Ambulatory Visit: Payer: Self-pay

## 2023-05-07 ENCOUNTER — Encounter: Payer: Self-pay | Admitting: Podiatry

## 2023-05-11 ENCOUNTER — Other Ambulatory Visit: Payer: Self-pay

## 2023-06-03 ENCOUNTER — Other Ambulatory Visit: Payer: Self-pay

## 2023-06-03 MED ORDER — DOXYCYCLINE HYCLATE 100 MG PO TABS
ORAL_TABLET | ORAL | 0 refills | Status: DC
Start: 2023-06-03 — End: 2023-07-05
  Filled 2023-06-03: qty 20, 10d supply, fill #0

## 2023-06-04 ENCOUNTER — Other Ambulatory Visit: Payer: Self-pay

## 2023-07-05 ENCOUNTER — Ambulatory Visit: Payer: Medicaid Other | Attending: Nurse Practitioner | Admitting: Nurse Practitioner

## 2023-07-05 ENCOUNTER — Other Ambulatory Visit: Payer: Self-pay

## 2023-07-05 ENCOUNTER — Encounter: Payer: Self-pay | Admitting: Nurse Practitioner

## 2023-07-05 VITALS — BP 129/82 | HR 79 | Ht 63.0 in | Wt 265.8 lb

## 2023-07-05 DIAGNOSIS — D649 Anemia, unspecified: Secondary | ICD-10-CM | POA: Diagnosis not present

## 2023-07-05 DIAGNOSIS — R7303 Prediabetes: Secondary | ICD-10-CM | POA: Diagnosis not present

## 2023-07-05 DIAGNOSIS — H6591 Unspecified nonsuppurative otitis media, right ear: Secondary | ICD-10-CM | POA: Diagnosis not present

## 2023-07-05 MED ORDER — FLUTICASONE PROPIONATE 50 MCG/ACT NA SUSP
2.0000 | Freq: Every day | NASAL | 6 refills | Status: AC
  Filled 2023-07-05 – 2023-07-30 (×2): qty 16, 30d supply, fill #0

## 2023-07-05 NOTE — Progress Notes (Signed)
Assessment & Plan:  Carla Carson was seen today for medical management of chronic issues.  Diagnoses and all orders for this visit:  Prediabetes -     Hemoglobin A1c -     CMP14+EGFR  Anemia, unspecified type -     CBC with Differential/Platelet  Fluid level behind tympanic membrane of right ear -     fluticasone (FLONASE) 50 MCG/ACT nasal spray; Place 2 sprays into both nostrils daily.    Patient has been counseled on age-appropriate routine health concerns for screening and prevention. These are reviewed and up-to-date. Referrals have been placed accordingly. Immunizations are up-to-date or declined.    Subjective:   Chief Complaint  Patient presents with   Medical Management of Chronic Issues   HPI Carla Carson 37 y.o. female presents to office today requesting lab draw for history of prediabetes.  She has a history of gestational prediabetes. States she was recently seen by podiatry and has a foot condition that requires orthotic shoes. She can not afford the out of pocket cost and wants to knows if she qualifies for diabetic shoes.   She has right eustachian tube dysfunction. Notes pressure and feeling of fluid sensation of the right ear. She reports just getting over a head cold about a week or so ago. She does smoke cigarettes as well.     Review of Systems  Constitutional:  Negative for fever, malaise/fatigue and weight loss.  HENT:  Negative for nosebleeds.        SEE HPI  Eyes: Negative.  Negative for blurred vision, double vision and photophobia.  Respiratory: Negative.  Negative for cough and shortness of breath.   Cardiovascular: Negative.  Negative for chest pain, palpitations and leg swelling.  Gastrointestinal: Negative.  Negative for heartburn, nausea and vomiting.  Musculoskeletal: Negative.  Negative for myalgias.  Neurological: Negative.  Negative for dizziness, focal weakness, seizures and headaches.  Psychiatric/Behavioral: Negative.  Negative for  suicidal ideas.     Past Medical History:  Diagnosis Date   Chlamydia    Gonorrhea    Infection    UTI   Ovarian cyst    Preeclampsia    Syphilis     Past Surgical History:  Procedure Laterality Date   CESAREAN SECTION N/A 11/07/2015   Procedure: CESAREAN SECTION;  Surgeon: Willodean Rosenthal, MD;  Location: [facility];  Service: Obstetrics;  Laterality: N/A;   TUBAL LIGATION Bilateral 11/26/2016   Procedure: POST PARTUM TUBAL LIGATION;  Surgeon: Hermina Staggers, MD;  Location: [facility];  Service: Gynecology;  Laterality: Bilateral;   WISDOM TOOTH EXTRACTION      Family History  Problem Relation Age of Onset   Heart disease Mother        hole in heart   Cancer Maternal Grandmother     Social History Reviewed with no changes to be made today.   Outpatient Medications Prior to Visit  Medication Sig Dispense Refill   chlorhexidine (HIBICLENS) 4 % external liquid Apply topically daily as needed. (Patient not taking: Reported on 07/05/2023) 1000 mL 1   doxycycline (VIBRA-TABS) 100 MG tablet Take 1 capsule (100 mg total) by mouth in the morning and 1 capsule (100 mg total) in the evening. Do all this for 10 days. Take with at least 8 ounces (large glass) of water, do not lie down for 30 minutes after. (Patient not taking: Reported on 07/05/2023) 20 tablet 0   methylPREDNISolone (MEDROL DOSEPAK) 4 MG TBPK tablet Take as directed (Patient not taking: Reported on  07/05/2023) 21 tablet 0   No facility-administered medications prior to visit.    Allergies  Allergen Reactions   Nubain [Nalbuphine Hcl] Itching       Objective:    BP 129/82 (BP Location: Left Arm, Patient Position: Sitting, Cuff Size: Large)   Pulse 79   Ht 5\' 3"  (1.6 m)   Wt 265 lb 12.8 oz (120.6 kg)   LMP 06/25/2023   SpO2 100%   BMI 47.08 kg/m  Wt Readings from Last 3 Encounters:  07/05/23 265 lb 12.8 oz (120.6 kg)  11/24/22 266 lb 9.6 oz (120.9 kg)  01/14/22 275 lb (124.7 kg)    Physical Exam Vitals and  nursing note reviewed.  Constitutional:      Appearance: She is well-developed.  HENT:     Head: Normocephalic and atraumatic.     Right Ear: A middle ear effusion is present.     Left Ear: A middle ear effusion is present.  Cardiovascular:     Rate and Rhythm: Normal rate and regular rhythm.     Heart sounds: Normal heart sounds. No murmur heard.    No friction rub. No gallop.  Pulmonary:     Effort: Pulmonary effort is normal. No tachypnea or respiratory distress.     Breath sounds: Normal breath sounds. No decreased breath sounds, wheezing, rhonchi or rales.  Chest:     Chest wall: No tenderness.  Abdominal:     General: Bowel sounds are normal.     Palpations: Abdomen is soft.  Musculoskeletal:        General: Normal range of motion.     Cervical back: Normal range of motion.  Skin:    General: Skin is warm and dry.  Neurological:     Mental Status: She is alert and oriented to person, place, and time.     Coordination: Coordination normal.  Psychiatric:        Behavior: Behavior normal. Behavior is cooperative.        Thought Content: Thought content normal.        Judgment: Judgment normal.          Patient has been counseled extensively about nutrition and exercise as well as the importance of adherence with medications and regular follow-up. The patient was given clear instructions to go to ER or return to medical center if symptoms don't improve, worsen or new problems develop. The patient verbalized understanding.   Follow-up: Return if symptoms worsen or fail to improve.   Claiborne Rigg, FNP-BC [facility] [place], [place] [phone]   07/05/2023, 3:41 PM

## 2023-07-06 ENCOUNTER — Telehealth: Payer: Self-pay | Admitting: *Deleted

## 2023-07-06 ENCOUNTER — Other Ambulatory Visit: Payer: Self-pay | Admitting: Nurse Practitioner

## 2023-07-06 DIAGNOSIS — D72829 Elevated white blood cell count, unspecified: Secondary | ICD-10-CM

## 2023-07-06 DIAGNOSIS — D696 Thrombocytopenia, unspecified: Secondary | ICD-10-CM

## 2023-07-06 LAB — CMP14+EGFR
ALT: 14 IU/L (ref 0–32)
AST: 11 IU/L (ref 0–40)
Albumin: 4 g/dL (ref 3.9–4.9)
Alkaline Phosphatase: 94 IU/L (ref 44–121)
BUN/Creatinine Ratio: 8 — ABNORMAL LOW (ref 9–23)
BUN: 7 mg/dL (ref 6–20)
Bilirubin Total: <0.2 mg/dL (ref 0.0–1.2)
CO2: 26 mmol/L (ref 20–29)
Calcium: 9.7 mg/dL (ref 8.7–10.2)
Chloride: 105 mmol/L (ref 96–106)
Creatinine, Ser: 0.88 mg/dL (ref 0.57–1.00)
Globulin, Total: 3.4 g/dL (ref 1.5–4.5)
Glucose: 87 mg/dL (ref 70–99)
Potassium: 4.8 mmol/L (ref 3.5–5.2)
Sodium: 142 mmol/L (ref 134–144)
Total Protein: 7.4 g/dL (ref 6.0–8.5)
eGFR: 88 mL/min/{1.73_m2} (ref >59)

## 2023-07-06 LAB — CBC WITH DIFFERENTIAL/PLATELET
Basophils Absolute: 0.1 10*3/uL (ref 0.0–0.2)
Basos: 0 %
EOS (ABSOLUTE): 0.2 10*3/uL (ref 0.0–0.4)
Eos: 2 %
Hematocrit: 36 % (ref 34.0–46.6)
Hemoglobin: 11.5 g/dL (ref 11.1–15.9)
Immature Grans (Abs): 0 10*3/uL (ref 0.0–0.1)
Immature Granulocytes: 0 %
Lymphocytes Absolute: 4.7 10*3/uL — ABNORMAL HIGH (ref 0.7–3.1)
Lymphs: 39 %
MCH: 24.7 pg — ABNORMAL LOW (ref 26.6–33.0)
MCHC: 31.9 g/dL (ref 31.5–35.7)
MCV: 77 fL — ABNORMAL LOW (ref 79–97)
Monocytes Absolute: 1 10*3/uL — ABNORMAL HIGH (ref 0.1–0.9)
Monocytes: 9 %
Neutrophils Absolute: 6 10*3/uL (ref 1.4–7.0)
Neutrophils: 50 %
Platelets: 491 10*3/uL — ABNORMAL HIGH (ref 150–450)
RBC: 4.65 x10E6/uL (ref 3.77–5.28)
RDW: 16.6 % — ABNORMAL HIGH (ref 11.7–15.4)
WBC: 11.9 10*3/uL — ABNORMAL HIGH (ref 3.4–10.8)

## 2023-07-06 LAB — HEMOGLOBIN A1C
Est. average glucose Bld gHb Est-mCnc: 120 mg/dL
Hgb A1c MFr Bld: 5.8 % — ABNORMAL HIGH (ref 4.8–5.6)

## 2023-07-06 NOTE — Telephone Encounter (Signed)
Enocunter same recording as previous RN with returning patient call to discuss concerns on lab results.

## 2023-07-06 NOTE — Telephone Encounter (Signed)
Patient called requesting to review lab results. During transfer from agent call disconnected. NT attempted to contact patient on #[phone] no answer, recording , call can not be completed at this time. Patient since called back for lab results. PCP has not documented response regarding labs results at this time from yesterday's labs. Please advise . Patient reports she does not understanding values and would like to discuss .

## 2023-07-09 ENCOUNTER — Other Ambulatory Visit: Payer: Self-pay

## 2023-07-19 ENCOUNTER — Ambulatory Visit: Payer: Commercial Managed Care - HMO | Admitting: Nurse Practitioner

## 2023-07-30 ENCOUNTER — Ambulatory Visit: Payer: Self-pay | Admitting: *Deleted

## 2023-07-30 ENCOUNTER — Other Ambulatory Visit: Payer: Self-pay

## 2023-07-30 ENCOUNTER — Ambulatory Visit: Payer: Medicaid Other | Admitting: Nurse Practitioner

## 2023-07-30 NOTE — Telephone Encounter (Signed)
  Chief Complaint: elevated BP Symptoms: Patient states she has had eelavted BP readings along with frequent headache, increase urination Frequency: BP reading from yesterday and today- elevated Pertinent Negatives: Patient denies blurred vision, chest pain, difficulty breathing, weakness Disposition: [] ED /[] Urgent Care (no appt availability in office) / [x] Appointment(In office/virtual)/ []  [facility]/ [] Home Care/ [] Refused Recommended Disposition /[] [facility]/ []  Follow-up with PCP Additional Notes: Patient had been on BP medication in the past- she was taken off. Patient is now getting elevated readings- frequent headache, frequent uination. Patient states she wants the early am appointment- she can make it.

## 2023-07-30 NOTE — Telephone Encounter (Signed)
Summary: BP Advice   Pt is calling to report that her BP is 140/90 today and 154/90 yesterday. And urinating frequently. Requesting advise         Reason for Disposition  [1] Systolic BP  >= 130 OR Diastolic >= 80 AND [2] not taking BP medications  Answer Assessment - Initial Assessment Questions 1. BLOOD PRESSURE: "What is the blood pressure?" "Did you take at least two measurements 5 minutes apart?"     140/90, 153/84 P96 2. ONSET: "When did you take your blood pressure?"     7:30, 8:45 3. HOW: "How did you take your blood pressure?" (e.g., automatic home BP monitor, visiting nurse)     Automatic- arm 4. HISTORY: "Do you have a history of high blood pressure?"     Was taking- patient was taken off of BP medication- 1 year ago 5. MEDICINES: "Are you taking any medicines for blood pressure?" "Have you missed any doses recently?"     none 6. OTHER SYMPTOMS: "Do you have any symptoms?" (e.g., blurred vision, chest pain, difficulty breathing, headache, weakness)     Headache- often  Protocols used: Blood Pressure - High-A-AH

## 2023-08-02 ENCOUNTER — Inpatient Hospital Stay: Payer: Medicaid Other | Attending: Hematology | Admitting: Hematology and Oncology

## 2023-08-02 ENCOUNTER — Telehealth: Payer: Self-pay

## 2023-08-02 ENCOUNTER — Other Ambulatory Visit: Payer: Self-pay

## 2023-08-02 ENCOUNTER — Encounter: Payer: Self-pay | Admitting: Hematology and Oncology

## 2023-08-02 ENCOUNTER — Inpatient Hospital Stay: Payer: Medicaid Other

## 2023-08-02 VITALS — BP 145/79 | HR 77 | Temp 98.7°F | Resp 18 | Ht 63.0 in | Wt 267.6 lb

## 2023-08-02 DIAGNOSIS — D75838 Other thrombocytosis: Secondary | ICD-10-CM | POA: Diagnosis not present

## 2023-08-02 DIAGNOSIS — L732 Hidradenitis suppurativa: Secondary | ICD-10-CM | POA: Insufficient documentation

## 2023-08-02 DIAGNOSIS — Z79899 Other long term (current) drug therapy: Secondary | ICD-10-CM | POA: Diagnosis not present

## 2023-08-02 DIAGNOSIS — F1290 Cannabis use, unspecified, uncomplicated: Secondary | ICD-10-CM | POA: Diagnosis not present

## 2023-08-02 DIAGNOSIS — N920 Excessive and frequent menstruation with regular cycle: Secondary | ICD-10-CM | POA: Insufficient documentation

## 2023-08-02 DIAGNOSIS — D509 Iron deficiency anemia, unspecified: Secondary | ICD-10-CM | POA: Insufficient documentation

## 2023-08-02 DIAGNOSIS — B3731 Acute candidiasis of vulva and vagina: Secondary | ICD-10-CM | POA: Diagnosis not present

## 2023-08-02 DIAGNOSIS — D72825 Bandemia: Secondary | ICD-10-CM

## 2023-08-02 DIAGNOSIS — D500 Iron deficiency anemia secondary to blood loss (chronic): Secondary | ICD-10-CM

## 2023-08-02 DIAGNOSIS — F17210 Nicotine dependence, cigarettes, uncomplicated: Secondary | ICD-10-CM | POA: Diagnosis not present

## 2023-08-02 DIAGNOSIS — K089 Disorder of teeth and supporting structures, unspecified: Secondary | ICD-10-CM

## 2023-08-02 DIAGNOSIS — Z720 Tobacco use: Secondary | ICD-10-CM

## 2023-08-02 DIAGNOSIS — D72829 Elevated white blood cell count, unspecified: Secondary | ICD-10-CM | POA: Diagnosis not present

## 2023-08-02 MED ORDER — SPIRONOLACTONE 25 MG PO TABS
25.0000 mg | ORAL_TABLET | Freq: Every day | ORAL | 2 refills | Status: AC
  Filled 2023-08-02: qty 30, 30d supply, fill #0

## 2023-08-02 MED ORDER — MINOCYCLINE HCL 100 MG PO CAPS
100.0000 mg | ORAL_CAPSULE | Freq: Two times a day (BID) | ORAL | 3 refills | Status: AC
  Filled 2023-08-02: qty 14, 7d supply, fill #0

## 2023-08-02 MED ORDER — FLUCONAZOLE 150 MG PO TABS
150.0000 mg | ORAL_TABLET | Freq: Every day | ORAL | 0 refills | Status: DC
  Filled 2023-08-02: qty 1, 1d supply, fill #0

## 2023-08-02 NOTE — Telephone Encounter (Signed)
Called to confirm appt today. She is aware of appts and will arrive at 2:20 pm for appt.

## 2023-08-03 ENCOUNTER — Encounter: Payer: Self-pay | Admitting: Hematology and Oncology

## 2023-08-03 ENCOUNTER — Other Ambulatory Visit: Payer: Self-pay

## 2023-08-03 DIAGNOSIS — Z720 Tobacco use: Secondary | ICD-10-CM | POA: Insufficient documentation

## 2023-08-03 DIAGNOSIS — D75838 Other thrombocytosis: Secondary | ICD-10-CM | POA: Insufficient documentation

## 2023-08-03 DIAGNOSIS — K089 Disorder of teeth and supporting structures, unspecified: Secondary | ICD-10-CM | POA: Insufficient documentation

## 2023-08-03 DIAGNOSIS — D509 Iron deficiency anemia, unspecified: Secondary | ICD-10-CM | POA: Insufficient documentation

## 2023-08-03 DIAGNOSIS — L732 Hidradenitis suppurativa: Secondary | ICD-10-CM | POA: Insufficient documentation

## 2023-08-03 DIAGNOSIS — D72829 Elevated white blood cell count, unspecified: Secondary | ICD-10-CM | POA: Insufficient documentation

## 2023-08-03 DIAGNOSIS — B3731 Acute candidiasis of vulva and vagina: Secondary | ICD-10-CM | POA: Insufficient documentation

## 2023-08-03 NOTE — Assessment & Plan Note (Signed)
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

## 2023-08-03 NOTE — Progress Notes (Signed)
[facility] CONSULT NOTE  Patient Care Team: Claiborne Rigg, NP as PCP - General (Nurse Practitioner) Patient, No Pcp Per (General Practice)   ASSESSMENT & PLAN  Leukocytosis She has chronic intermittent leukocytosis coinciding with intermittent flare of hidradenitis.  The patient also have chronic smoking, poor dentition and recurrent skin infection These are all reactive in nature We discussed importance of nicotine cessation. I recommend a trial of intermittent dosing of antibiotics to treat hidradenitis. I recommend dental visit and consideration for complete dental extraction. 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 I anticipate improvement with resolution of iron deficiency and infection  Tobacco abuse We discussed importance of nicotine cessation and the patient is willing to try to quit  Poor dentition We discussed importance of dental visit and consideration for complete dental extraction  Hidradenitis She has recurrent infection She has recurrent flare with her menstrual cycle I recommend a trial of low-dose spironolactone We discussed importance of adequate hydration while on the spironolactone I will also give her a trial of prescription antibiotics We discussed risk, benefits, side effects of antibiotic therapy and she agreed to proceed I plan to reassess in 3 months  Vaginal yeast infection I will prescribe a dose of fluconazole  No orders of the defined types were placed in this encounter.   All questions were answered. The patient knows to call the clinic with any problems, questions or concerns. I spent 60 minutes  counseling the patient face to face, counseling and review of plan of care.   Artis Delay, MD 08/03/2023 8:11 AM   CHIEF COMPLAINTS/PURPOSE OF CONSULTATION:  Leukocytosis, thrombocytosis, mild iron deficiency anemia  HISTORY OF PRESENTING ILLNESS:  Carla Carson 37 y.o. female is here because of abnormal CBC I have reviewed her electronic records.  The patient have chronic intermittent leukocytosis and over the last few years, had intermittent thrombocytosis Her most recent blood work show signs of iron deficiency anemia She is here accompanied by her wife The patient had chronic intermittent infection in her armpit and groin for over 10 years She has not been treated with any trial of antibiotics She has also noted intermittent menorrhagia with irregular menstrual cycles.  Over the last few months, she have noted craving for ice She denies other forms of bleeding She has never needed blood transfusion support She eats a variety of diet She had poor dentition and was advised by her dentist many years ago that she need dental dental extraction but could not afford it She has noticed recent yeast infection  There is not reported symptoms of sinus congestion, cough, urinary frequency/urgency or dysuria, diarrhea, joint swelling/pain  The patient is a smoker and currently smokes 1 pack of cigarettes per day for the last 14 years.  MEDICAL HISTORY:  Past Medical History:  Diagnosis Date   Chlamydia    Gonorrhea    Infection    UTI   Ovarian cyst    Preeclampsia    Syphilis     SURGICAL HISTORY: Past Surgical History:  Procedure Laterality Date   CESAREAN SECTION N/A 11/07/2015   Procedure: CESAREAN SECTION;  Surgeon: Willodean Rosenthal, MD;  Location: [facility];  Service: Obstetrics;  Laterality: N/A;   TUBAL LIGATION Bilateral 11/26/2016   Procedure: POST PARTUM TUBAL LIGATION;  Surgeon: Hermina Staggers, MD;  Location: [facility];  Service: Gynecology;  Laterality: Bilateral;    WISDOM TOOTH EXTRACTION      SOCIAL HISTORY: Social History   Socioeconomic History   Marital status: Single    Spouse name: Not on file   Number of children: 4   Years of education: Not on file   Highest education level: 12th grade  Occupational History   Occupation: PCA  Tobacco Use   Smoking status: Every Day    Current packs/day: 1.00    Average packs/day: 1 pack/day for 12.8 years (12.8 ttl pk-yrs)    Types: Cigarettes    Start date: 2012   Smokeless tobacco: Never  Vaping Use   Vaping status: Former  Substance and Sexual Activity   Alcohol use: No   Drug use: Yes    Types: Marijuana    Comment: once in a blue moon   Sexual activity: Yes    Birth control/protection: None  Other Topics Concern   Not on file  Social History Narrative   ** Merged History Encounter **       Social Determinants of Health   Financial Resource Strain: Medium Risk (07/04/2023)   Overall Financial Resource Strain (CARDIA)    Difficulty of Paying Living Expenses: Somewhat hard  Food Insecurity: Food Insecurity Present (07/04/2023)   Hunger Vital Sign    Worried About Running Out of Food in the Last Year: Sometimes true    Ran Out of Food in the Last Year: Often true  Transportation Needs: No Transportation Needs (07/04/2023)   PRAPARE - Administrator, Civil Service (Medical): No    Lack of Transportation (Non-Medical): No  Physical Activity: Sufficiently Active (07/04/2023)   Exercise Vital Sign    Days of Exercise per Week: 7 days    Minutes of Exercise per Session: 40 min  Stress: No Stress Concern Present (07/04/2023)   [employer] of Occupational Health - Occupational Stress Questionnaire    Feeling of Stress : Not at all  Social Connections: Moderately Isolated (07/04/2023)   Social Connection and Isolation Panel [NHANES]    Frequency of Communication with Friends and Family: Twice a week    Frequency of Social Gatherings with Friends and Family: Never    Attends  Religious Services: More than 4 times per year    Active Member of [employer] or Organizations: No    Attends Engineer, structural: Not on file    Marital Status: Married  Catering manager Violence: Not on file    FAMILY HISTORY: Family History  Problem Relation Age of Onset   Heart disease Mother        hole in heart   Cancer Maternal Grandmother     ALLERGIES:  is allergic to nubain [nalbuphine hcl].  MEDICATIONS:  Current Outpatient Medications  Medication Sig Dispense Refill   fluconazole (DIFLUCAN) 150 MG tablet Take 1 tablet (150 mg total) by mouth daily. 1 tablet 0   minocycline (MINOCIN) 100 MG capsule Take 1 capsule (100 mg total) by mouth 2 (two) times daily. 14 capsule 3   spironolactone (ALDACTONE) 25 MG tablet Take 1 tablet (25 mg total) by mouth daily. 30 tablet 2   chlorhexidine (HIBICLENS) 4 % external liquid Apply topically daily as needed. (Patient not taking: Reported on 07/05/2023) 1000 mL 1   fluticasone (FLONASE) 50 MCG/ACT nasal spray Place 2 sprays  into both nostrils daily. 16 g 6   No current facility-administered medications for this visit.    REVIEW OF SYSTEMS:   Constitutional: Denies fevers, chills or abnormal night sweats Eyes: Denies blurriness of vision, double vision or watery eyes Ears, nose, mouth, throat, and face: Denies mucositis or sore throat Respiratory: Denies cough, dyspnea or wheezes Cardiovascular: Denies palpitation, chest discomfort or lower extremity swelling Gastrointestinal:  Denies nausea, heartburn or change in bowel habits Lymphatics: Denies new lymphadenopathy or easy bruising Neurological:Denies numbness, tingling or new weaknesses Behavioral/Psych: Mood is stable, no new changes  All other systems were reviewed with the patient and are negative.  PHYSICAL EXAMINATION: ECOG PERFORMANCE STATUS: 1 - Symptomatic but completely ambulatory  Vitals:   08/02/23 1432  BP: (!) 145/79  Pulse: 77  Resp: 18  Temp: 98.7 F  (37.1 C)  SpO2: 100%   Filed Weights   08/02/23 1432  Weight: 267 lb 9.6 oz (121.4 kg)    GENERAL:alert, no distress and comfortable.  Limited abdominal examination due to central obesity SKIN: Noted extensive scarring in her armpit area from chronic intermittent infection.  She also have mild erythematous changes on her skin fold EYES: normal, conjunctiva are pink and non-injected, sclera clear OROPHARYNX:no exudate, no erythema and lips, buccal mucosa, and tongue normal.  Noted poor dentition NECK: supple, thyroid normal size, non-tender, without nodularity LYMPH:  no palpable lymphadenopathy in the cervical, axillary or inguinal LUNGS: clear to auscultation and percussion with normal breathing effort HEART: regular rate & rhythm and no murmurs and no lower extremity edema ABDOMEN:abdomen soft, non-tender and normal bowel sounds Musculoskeletal:no cyanosis of digits and no clubbing  PSYCH: alert & oriented x 3 with fluent speech NEURO: no focal motor/sensory deficits  LABORATORY DATA:  I have reviewed the data as listed Recent Results (from the past 2160 hour(s))  Hemoglobin A1c     Status: Abnormal   Collection Time: 07/05/23  3:49 PM  Result Value Ref Range   Hgb A1c MFr Bld 5.8 (H) 4.8 - 5.6 %    Comment:          Prediabetes: 5.7 - 6.4          Diabetes: >6.4          Glycemic control for adults with diabetes: <7.0    Est. average glucose Bld gHb Est-mCnc 120 mg/dL  [id number]     Status: Abnormal   Collection Time: 07/05/23  3:49 PM  Result Value Ref Range   Glucose 87 70 - 99 mg/dL   BUN 7 6 - 20 mg/dL   Creatinine, Ser 1.91 0.57 - 1.00 mg/dL   eGFR 88 >47 WG/NFA/2.13   BUN/Creatinine Ratio 8 (L) 9 - 23   Sodium 142 134 - 144 mmol/L   Potassium 4.8 3.5 - 5.2 mmol/L   Chloride 105 96 - 106 mmol/L   CO2 26 20 - 29 mmol/L   Calcium 9.7 8.7 - 10.2 mg/dL   Total Protein 7.4 6.0 - 8.5 g/dL   Albumin 4.0 3.9 - 4.9 g/dL   Globulin, Total 3.4 1.5 - 4.5 g/dL    Bilirubin Total <0.8 0.0 - 1.2 mg/dL   Alkaline Phosphatase 94 44 - 121 IU/L   AST 11 0 - 40 IU/L   ALT 14 0 - 32 IU/L  CBC with Differential/Platelet     Status: Abnormal   Collection Time: 07/05/23  3:49 PM  Result Value Ref Range   WBC 11.9 (H) 3.4 - 10.8 x10E3/uL  RBC 4.65 3.77 - 5.28 x10E6/uL   Hemoglobin 11.5 11.1 - 15.9 g/dL   Hematocrit 13.0 86.5 - 46.6 %   MCV 77 (L) 79 - 97 fL   MCH 24.7 (L) 26.6 - 33.0 pg   MCHC 31.9 31.5 - 35.7 g/dL   RDW 78.4 (H) 69.6 - 29.5 %   Platelets 491 (H) 150 - 450 x10E3/uL   Neutrophils 50 Not Estab. %   Lymphs 39 Not Estab. %   Monocytes 9 Not Estab. %   Eos 2 Not Estab. %   Basos 0 Not Estab. %   Neutrophils Absolute 6.0 1.4 - 7.0 x10E3/uL   Lymphocytes Absolute 4.7 (H) 0.7 - 3.1 x10E3/uL   Monocytes Absolute 1.0 (H) 0.1 - 0.9 x10E3/uL   EOS (ABSOLUTE) 0.2 0.0 - 0.4 x10E3/uL   Basophils Absolute 0.1 0.0 - 0.2 x10E3/uL   Immature Granulocytes 0 Not Estab. %   Immature Grans (Abs) 0.0 0.0 - 0.1 x10E3/uL

## 2023-08-03 NOTE — Assessment & Plan Note (Signed)
We discussed importance of dental visit and consideration for complete dental extraction

## 2023-08-03 NOTE — Assessment & Plan Note (Signed)
We discussed importance of nicotine cessation and the patient is willing to try to quit

## 2023-08-03 NOTE — Assessment & Plan Note (Signed)
She has chronic intermittent leukocytosis coinciding with intermittent flare of hidradenitis.  The patient also have chronic smoking, poor dentition and recurrent skin infection These are all reactive in nature We discussed importance of nicotine cessation. I recommend a trial of intermittent dosing of antibiotics to treat hidradenitis. I recommend dental visit and consideration for complete dental extraction. I plan to see her again in a few months for further follow-up

## 2023-08-03 NOTE — Assessment & Plan Note (Signed)
She has signs of iron deficiency due to menorrhagia She is just borderline anemic and does not require intravenous iron infusion Recommend a trial of oral iron supplement

## 2023-08-03 NOTE — Assessment & Plan Note (Signed)
I will prescribe a dose of fluconazole

## 2023-08-03 NOTE — Assessment & Plan Note (Signed)
She has recurrent infection She has recurrent flare with her menstrual cycle I recommend a trial of low-dose spironolactone We discussed importance of adequate hydration while on the spironolactone I will also give her a trial of prescription antibiotics We discussed risk, benefits, side effects of antibiotic therapy and she agreed to proceed I plan to reassess in 3 months

## 2023-08-04 ENCOUNTER — Encounter: Payer: Medicaid Other | Admitting: Hematology

## 2023-08-04 ENCOUNTER — Other Ambulatory Visit: Payer: Medicaid Other

## 2023-08-06 ENCOUNTER — Ambulatory Visit: Payer: Medicaid Other | Admitting: Nurse Practitioner

## 2023-08-11 ENCOUNTER — Other Ambulatory Visit: Payer: Self-pay

## 2023-11-02 ENCOUNTER — Encounter: Payer: Self-pay | Admitting: Hematology and Oncology

## 2023-11-02 ENCOUNTER — Inpatient Hospital Stay: Payer: 59 | Attending: Hematology

## 2023-11-02 ENCOUNTER — Inpatient Hospital Stay: Payer: 59 | Admitting: Hematology and Oncology

## 2023-11-26 ENCOUNTER — Encounter: Payer: 59 | Admitting: Nurse Practitioner

## 2023-12-03 ENCOUNTER — Telehealth: Payer: Self-pay | Admitting: Nurse Practitioner

## 2023-12-03 NOTE — Telephone Encounter (Signed)
 Contacted pt left vm to confirm appt! on 2/12

## 2023-12-08 ENCOUNTER — Encounter: Payer: 59 | Admitting: Nurse Practitioner

## 2024-01-12 ENCOUNTER — Encounter: Payer: 59 | Admitting: Nurse Practitioner

## 2024-01-28 ENCOUNTER — Other Ambulatory Visit (HOSPITAL_COMMUNITY)
Admission: RE | Admit: 2024-01-28 | Discharge: 2024-01-28 | Disposition: A | Discharge status: Home / Self Care | Source: Ambulatory Visit | Attending: Nurse Practitioner | Admitting: Nurse Practitioner

## 2024-01-28 ENCOUNTER — Other Ambulatory Visit: Payer: Self-pay

## 2024-01-28 ENCOUNTER — Encounter: Payer: Self-pay | Admitting: Nurse Practitioner

## 2024-01-28 ENCOUNTER — Ambulatory Visit: Attending: Nurse Practitioner | Admitting: Nurse Practitioner

## 2024-01-28 VITALS — BP 126/80 | HR 82 | Temp 98.4°F | Ht 63.0 in | Wt 281.0 lb

## 2024-01-28 DIAGNOSIS — E782 Mixed hyperlipidemia: Secondary | ICD-10-CM | POA: Diagnosis not present

## 2024-01-28 DIAGNOSIS — R7303 Prediabetes: Secondary | ICD-10-CM | POA: Diagnosis not present

## 2024-01-28 DIAGNOSIS — N760 Acute vaginitis: Secondary | ICD-10-CM | POA: Diagnosis not present

## 2024-01-28 DIAGNOSIS — Z0000 Encounter for general adult medical examination without abnormal findings: Secondary | ICD-10-CM

## 2024-01-28 DIAGNOSIS — N939 Abnormal uterine and vaginal bleeding, unspecified: Secondary | ICD-10-CM | POA: Diagnosis not present

## 2024-01-28 DIAGNOSIS — L732 Hidradenitis suppurativa: Secondary | ICD-10-CM

## 2024-01-28 LAB — POCT URINE PREGNANCY: Preg Test, Ur: NEGATIVE

## 2024-01-28 MED ORDER — CHLORHEXIDINE GLUCONATE 4 % EX SOLN
Freq: Every day | CUTANEOUS | 2 refills | Status: AC | PRN
  Filled 2024-01-28: qty 500, fill #0

## 2024-01-28 NOTE — Progress Notes (Signed)
 Assessment & Plan:  Carla Carson was seen today for annual exam.  Diagnoses and all orders for this visit:  Encounter for annual physical exam -     Hemoglobin A1c -     CMP14+EGFR  Mixed hyperlipidemia -     Lipid panel  Prediabetes -     Hemoglobin A1c -     CMP14+EGFR  Acute vaginitis -     Cervicovaginal ancillary only -     HIV antibody (with reflex)  Abnormal uterine bleeding (AUB) -     POCT urine pregnancy  Hidradenitis suppurativa -     chlorhexidine (HIBICLENS) 4 % external liquid; Apply topically daily as needed.    Patient has been counseled on age-appropriate routine health concerns for screening and prevention. These are reviewed and up-to-date. Referrals have been placed accordingly. Immunizations are up-to-date or declined.    Subjective:   Chief Complaint  Patient presents with   Annual Exam    Physical. Reports missing mammogram last year- requesting new referral No to pneumonia vax    Carla Carson 38 y.o. female presents to office today for annual physical. She is accompanied by her wife.   Hidradenitis She is requesting refill of hibiclens which helps reduce her outbreaks.  She has recurrent flare with her menstrual cycle   She is requesting wet prep and pregnancy test today. Has history of trichomonas and AUB    Review of Systems  Constitutional: Negative.  Negative for chills, fever, malaise/fatigue and weight loss.  Respiratory: Negative.  Negative for cough, sputum production, shortness of breath and wheezing.   Cardiovascular: Negative.  Negative for chest pain and leg swelling.  Gastrointestinal: Negative.  Negative for abdominal pain, blood in stool, constipation, diarrhea, heartburn, melena, nausea and vomiting.  Genitourinary: Negative.   Skin: Negative.  Negative for rash.  Neurological: Negative.  Negative for dizziness, tremors, speech change, focal weakness, seizures and headaches.  Psychiatric/Behavioral: Negative.  Negative  for depression and suicidal ideas. The patient is not nervous/anxious and does not have insomnia.     Past Medical History:  Diagnosis Date   Chlamydia    Gonorrhea    Infection    UTI   Ovarian cyst    Preeclampsia    Syphilis     Past Surgical History:  Procedure Laterality Date   CESAREAN SECTION N/A 11/07/2015   Procedure: CESAREAN SECTION;  Surgeon: Willodean Rosenthal, MD;  Location: [facility];  Service: Obstetrics;  Laterality: N/A;   TUBAL LIGATION Bilateral 11/26/2016   Procedure: POST PARTUM TUBAL LIGATION;  Surgeon: Hermina Staggers, MD;  Location: [facility];  Service: Gynecology;  Laterality: Bilateral;   WISDOM TOOTH EXTRACTION      Family History  Problem Relation Age of Onset   Heart disease Mother        hole in heart   Cancer Maternal Grandmother     Social History Reviewed with no changes to be made today.   Outpatient Medications Prior to Visit  Medication Sig Dispense Refill   fluticasone (FLONASE) 50 MCG/ACT nasal spray Place 2 sprays into both nostrils daily. (Patient not taking: Reported on 01/28/2024) 16 g 6   minocycline (MINOCIN) 100 MG capsule Take 1 capsule (100 mg total) by mouth 2 (two) times daily. (Patient not taking: Reported on 01/28/2024) 14 capsule 3   spironolactone (ALDACTONE) 25 MG tablet Take 1 tablet (25 mg total) by mouth daily. (Patient not taking: Reported on 01/28/2024) 30 tablet 2   chlorhexidine (HIBICLENS) 4 % external  liquid Apply topically daily as needed. (Patient not taking: Reported on 01/28/2024) 1000 mL 1   fluconazole (DIFLUCAN) 150 MG tablet Take 1 tablet (150 mg total) by mouth daily. (Patient not taking: Reported on 01/28/2024) 1 tablet 0   No facility-administered medications prior to visit.    Allergies  Allergen Reactions   Nubain [Nalbuphine Hcl] Itching       Objective:    BP 126/80 (BP Location: Left Arm, Patient Position: Sitting, Cuff Size: Large)   Pulse 82   Temp 98.4 F (36.9 C) (Oral)   Ht 5\' 3"  (1.6 m)   Wt  281 lb (127.5 kg)   SpO2 98%   BMI 49.78 kg/m  Wt Readings from Last 3 Encounters:  01/28/24 281 lb (127.5 kg)  08/02/23 267 lb 9.6 oz (121.4 kg)  07/05/23 265 lb 12.8 oz (120.6 kg)    Physical Exam Constitutional:      Appearance: She is well-developed.  HENT:     Head: Normocephalic and atraumatic.     Right Ear: Hearing, tympanic membrane, ear canal and external ear normal.     Left Ear: Hearing, tympanic membrane, ear canal and external ear normal.     Nose: Nose normal.     Right Turbinates: Not enlarged.     Left Turbinates: Not enlarged.     Mouth/Throat:     Lips: Pink.     Mouth: Mucous membranes are moist.     Dentition: No dental tenderness, gingival swelling, dental abscesses or gum lesions.     Pharynx: No oropharyngeal exudate.  Eyes:     General: No scleral icterus.       Right eye: No discharge.     Extraocular Movements: Extraocular movements intact.     Conjunctiva/sclera: Conjunctivae normal.     Pupils: Pupils are equal, round, and reactive to light.  Neck:     Thyroid: No thyromegaly.     Trachea: No tracheal deviation.  Cardiovascular:     Rate and Rhythm: Normal rate and regular rhythm.     Heart sounds: Normal heart sounds. No murmur heard.    No friction rub.  Pulmonary:     Effort: Pulmonary effort is normal. No accessory muscle usage or respiratory distress.     Breath sounds: Normal breath sounds. No decreased breath sounds, wheezing, rhonchi or rales.  Abdominal:     General: Bowel sounds are normal. There is no distension.     Palpations: Abdomen is soft. There is no mass.     Tenderness: There is no abdominal tenderness. There is no right CVA tenderness, left CVA tenderness, guarding or rebound.     Hernia: No hernia is present.  Musculoskeletal:        General: No tenderness or deformity. Normal range of motion.     Cervical back: Normal range of motion and neck supple.  Lymphadenopathy:     Cervical: No cervical adenopathy.  Skin:     General: Skin is warm and dry.     Findings: No erythema.  Neurological:     Mental Status: She is alert and oriented to person, place, and time.     Cranial Nerves: No cranial nerve deficit.     Motor: Motor function is intact.     Coordination: Coordination is intact. Coordination normal.     Gait: Gait is intact.     Deep Tendon Reflexes:     Reflex Scores:      Patellar reflexes are 1+ on the right  side and 1+ on the left side. Psychiatric:        Attention and Perception: Attention normal.        Mood and Affect: Mood normal.        Speech: Speech normal.        Behavior: Behavior normal.        Thought Content: Thought content normal.        Judgment: Judgment normal.          Patient has been counseled extensively about nutrition and exercise as well as the importance of adherence with medications and regular follow-up. The patient was given clear instructions to go to ER or return to medical center if symptoms don't improve, worsen or new problems develop. The patient verbalized understanding.   Follow-up: Return if symptoms worsen or fail to improve.   Claiborne Rigg, FNP-BC [facility] [place], [place] [phone]   01/28/2024, 2:58 PM

## 2024-01-29 LAB — HEMOGLOBIN A1C
Est. average glucose Bld gHb Est-mCnc: 114 mg/dL
Hgb A1c MFr Bld: 5.6 % (ref 4.8–5.6)

## 2024-01-29 LAB — CMP14+EGFR
ALT: 20 IU/L (ref 0–32)
AST: 17 IU/L (ref 0–40)
Albumin: 4.2 g/dL (ref 3.9–4.9)
Alkaline Phosphatase: 98 IU/L (ref 44–121)
BUN/Creatinine Ratio: 11 (ref 9–23)
BUN: 9 mg/dL (ref 6–20)
Bilirubin Total: <0.2 mg/dL (ref 0.0–1.2)
CO2: 23 mmol/L (ref 20–29)
Calcium: 9.7 mg/dL (ref 8.7–10.2)
Chloride: 102 mmol/L (ref 96–106)
Creatinine, Ser: 0.83 mg/dL (ref 0.57–1.00)
Globulin, Total: 3.2 g/dL (ref 1.5–4.5)
Glucose: 79 mg/dL (ref 70–99)
Potassium: 4.5 mmol/L (ref 3.5–5.2)
Sodium: 141 mmol/L (ref 134–144)
Total Protein: 7.4 g/dL (ref 6.0–8.5)
eGFR: 94 mL/min/{1.73_m2} (ref >59)

## 2024-01-29 LAB — HIV ANTIBODY (ROUTINE TESTING W REFLEX): HIV Screen 4th Generation wRfx: NONREACTIVE

## 2024-01-29 LAB — LIPID PANEL
Chol/HDL Ratio: 4.4 ratio (ref 0.0–4.4)
Cholesterol, Total: 179 mg/dL (ref 100–199)
HDL: 41 mg/dL (ref >39)
LDL Chol Calc (NIH): 121 mg/dL — ABNORMAL HIGH (ref 0–99)
Triglycerides: 92 mg/dL (ref 0–149)
VLDL Cholesterol Cal: 17 mg/dL (ref 5–40)

## 2024-01-31 ENCOUNTER — Encounter: Payer: Self-pay | Admitting: Nurse Practitioner

## 2024-01-31 ENCOUNTER — Other Ambulatory Visit: Payer: Self-pay | Admitting: Nurse Practitioner

## 2024-01-31 DIAGNOSIS — B9689 Other specified bacterial agents as the cause of diseases classified elsewhere: Secondary | ICD-10-CM

## 2024-01-31 DIAGNOSIS — A599 Trichomoniasis, unspecified: Secondary | ICD-10-CM

## 2024-01-31 LAB — CERVICOVAGINAL ANCILLARY ONLY
Bacterial Vaginitis (gardnerella): POSITIVE — AB
Candida Glabrata: NEGATIVE
Candida Vaginitis: NEGATIVE
Chlamydia: NEGATIVE
Comment: NEGATIVE
Comment: NEGATIVE
Comment: NEGATIVE
Comment: NEGATIVE
Comment: NEGATIVE
Comment: NORMAL
Neisseria Gonorrhea: NEGATIVE
Trichomonas: POSITIVE — AB

## 2024-01-31 MED ORDER — METRONIDAZOLE 500 MG PO TABS
500.0000 mg | ORAL_TABLET | Freq: Two times a day (BID) | ORAL | 0 refills | Status: AC
  Filled 2024-01-31 – 2024-09-19 (×2): qty 14, 7d supply, fill #0

## 2024-02-01 ENCOUNTER — Other Ambulatory Visit: Payer: Self-pay

## 2024-02-10 ENCOUNTER — Other Ambulatory Visit: Payer: Self-pay

## 2024-09-18 ENCOUNTER — Other Ambulatory Visit (HOSPITAL_COMMUNITY)
Admission: RE | Admit: 2024-09-18 | Discharge: 2024-09-18 | Disposition: A | Discharge status: Home / Self Care | Source: Ambulatory Visit | Attending: Nurse Practitioner | Admitting: Nurse Practitioner

## 2024-09-18 ENCOUNTER — Ambulatory Visit: Attending: Nurse Practitioner | Admitting: Nurse Practitioner

## 2024-09-18 ENCOUNTER — Encounter: Payer: Self-pay | Admitting: Nurse Practitioner

## 2024-09-18 VITALS — BP 123/81 | HR 83 | Resp 20 | Ht 63.0 in | Wt 282.0 lb

## 2024-09-18 DIAGNOSIS — A599 Trichomoniasis, unspecified: Secondary | ICD-10-CM | POA: Insufficient documentation

## 2024-09-18 DIAGNOSIS — R002 Palpitations: Secondary | ICD-10-CM

## 2024-09-19 ENCOUNTER — Other Ambulatory Visit: Payer: Self-pay

## 2024-09-19 LAB — CERVICOVAGINAL ANCILLARY ONLY
Bacterial Vaginitis (gardnerella): POSITIVE — AB
Candida Glabrata: NEGATIVE
Candida Vaginitis: NEGATIVE
Chlamydia: NEGATIVE
Comment: NEGATIVE
Comment: NEGATIVE
Comment: NEGATIVE
Comment: NEGATIVE
Comment: NEGATIVE
Comment: NORMAL
Neisseria Gonorrhea: NEGATIVE
Trichomonas: POSITIVE — AB

## 2024-09-20 LAB — THYROID PANEL WITH TSH
Free Thyroxine Index: 2.2 (ref 1.2–4.9)
T3 Uptake Ratio: 25 % (ref 24–39)
T4, Total: 8.8 ug/dL (ref 4.5–12.0)
TSH: 1.27 u[IU]/mL (ref 0.450–4.500)

## 2024-09-20 LAB — CBC WITH DIFFERENTIAL/PLATELET
Basophils Absolute: 0 x10E3/uL (ref 0.0–0.2)
Basos: 0 %
EOS (ABSOLUTE): 0.1 x10E3/uL (ref 0.0–0.4)
Eos: 1 %
Hematocrit: 40.8 % (ref 34.0–46.6)
Hemoglobin: 12.5 g/dL (ref 11.1–15.9)
Immature Grans (Abs): 0 x10E3/uL (ref 0.0–0.1)
Immature Granulocytes: 0 %
Lymphocytes Absolute: 4.3 x10E3/uL — ABNORMAL HIGH (ref 0.7–3.1)
Lymphs: 39 %
MCH: 25.3 pg — ABNORMAL LOW (ref 26.6–33.0)
MCHC: 30.6 g/dL — ABNORMAL LOW (ref 31.5–35.7)
MCV: 82 fL (ref 79–97)
Monocytes Absolute: 1.1 x10E3/uL — ABNORMAL HIGH (ref 0.1–0.9)
Monocytes: 10 %
Neutrophils Absolute: 5.5 x10E3/uL (ref 1.4–7.0)
Neutrophils: 50 %
Platelets: 523 x10E3/uL — ABNORMAL HIGH (ref 150–450)
RBC: 4.95 x10E6/uL (ref 3.77–5.28)
RDW: 15.9 % — ABNORMAL HIGH (ref 11.7–15.4)
WBC: 11 x10E3/uL — ABNORMAL HIGH (ref 3.4–10.8)

## 2024-09-20 LAB — SYPHILIS: RPR W/REFLEX TO RPR TITER AND TREPONEMAL ANTIBODIES, TRADITIONAL SCREENING AND DIAGNOSIS ALGORITHM: RPR Ser Ql: NONREACTIVE

## 2024-09-20 LAB — HEMOGLOBIN A1C
Est. average glucose Bld gHb Est-mCnc: 117 mg/dL
Hgb A1c MFr Bld: 5.7 % — ABNORMAL HIGH (ref 4.8–5.6)

## 2024-09-20 LAB — HIV ANTIBODY (ROUTINE TESTING W REFLEX): HIV Screen 4th Generation wRfx: NONREACTIVE

## 2024-09-24 ENCOUNTER — Ambulatory Visit: Payer: Self-pay | Admitting: Nurse Practitioner

## 2024-10-17 ENCOUNTER — Encounter: Payer: Self-pay | Admitting: Nurse Practitioner

## 2024-10-17 NOTE — Progress Notes (Signed)
 "  Assessment & Plan:  Darlin was seen today for exposure to std and palpitations.  Diagnoses and all orders for this visit:  Heart palpitations -     CBC with Differential -     Hemoglobin A1c -     Thyroid  Panel With TSH  Trichomoniasis -     Cervicovaginal ancillary only -     RPR W/RFLX TO RPR TITER, TREPONEMAL AB, SCREEN AND DIAGNOSIS -     HIV Antibody (routine testing w rflx)    Patient has been counseled on age-appropriate routine health concerns for screening and prevention. These are reviewed and up-to-date. Referrals have been placed accordingly. Immunizations are up-to-date or declined.    Subjective:   Chief Complaint  Patient presents with   Exposure to STD   Palpitations    Started two months ago.     History of Present Illness Carla Carson is a 38 year old female who presents with concerns about a possible sexually transmitted infection and palpitations.  She is concerned about a possible sexually transmitted infection after her partner was diagnosed with an STI. She is unsure how the infection could have been transmitted between her, as she engages in oral sex and occasionally uses toys and has a female partner. She is seeking testing to determine if she needs treatment.  She has been experiencing palpitations for the past two months, primarily occurring when lying down to relax. The palpitations last about five minutes. She consumes caffeine daily, including one Pepsi per day, and smokes cigarettes. She was previously informed of anemia. She is concerned about the palpitations and is seeking further evaluation.   Review of Systems  Constitutional:  Negative for fever, malaise/fatigue and weight loss.  Respiratory: Negative.    Cardiovascular:  Positive for palpitations. Negative for chest pain and leg swelling.  Gastrointestinal: Negative.  Negative for heartburn, nausea and vomiting.  Genitourinary:        SEE HPI  Musculoskeletal:  Negative for back  pain.  Psychiatric/Behavioral: Negative.  Negative for suicidal ideas.     Past Medical History:  Diagnosis Date   Chlamydia    Gonorrhea    Infection    UTI   Ovarian cyst    Preeclampsia    Syphilis     Past Surgical History:  Procedure Laterality Date   CESAREAN SECTION N/A 11/07/2015   Procedure: CESAREAN SECTION;  Surgeon: Elveria Mungo, MD;  Location: [facility];  Service: Obstetrics;  Laterality: N/A;   TUBAL LIGATION Bilateral 11/26/2016   Procedure: POST PARTUM TUBAL LIGATION;  Surgeon: Ozell LITTIE Cowman, MD;  Location: [facility];  Service: Gynecology;  Laterality: Bilateral;   WISDOM TOOTH EXTRACTION      Family History  Problem Relation Age of Onset   Heart disease Mother        hole in heart   Cancer Maternal Grandmother     Social History Reviewed with no changes to be made today.   Outpatient Medications Prior to Visit  Medication Sig Dispense Refill   chlorhexidine  (HIBICLENS ) 4 % external liquid Apply topically daily as needed. 500 mL 2   fluticasone  (FLONASE ) 50 MCG/ACT nasal spray Place 2 sprays into both nostrils daily. (Patient not taking: Reported on 09/18/2024) 16 g 6   minocycline  (MINOCIN ) 100 MG capsule Take 1 capsule (100 mg total) by mouth 2 (two) times daily. (Patient not taking: Reported on 09/18/2024) 14 capsule 3   spironolactone  (ALDACTONE ) 25 MG tablet Take 1 tablet (25 mg total) by  mouth daily. (Patient not taking: Reported on 09/18/2024) 30 tablet 2   No facility-administered medications prior to visit.    Allergies[1]     Objective:    BP 123/81 (BP Location: Left Arm, Patient Position: Sitting, Cuff Size: Normal)   Pulse 83   Resp 20   Ht 5' 3 (1.6 m)   Wt 282 lb (127.9 kg)   LMP 08/01/2024 (Exact Date)   SpO2 100%   BMI 49.95 kg/m  Wt Readings from Last 3 Encounters:  09/18/24 282 lb (127.9 kg)  01/28/24 281 lb (127.5 kg)  08/02/23 267 lb 9.6 oz (121.4 kg)    Physical Exam Vitals and nursing note reviewed.   Constitutional:      Appearance: She is well-developed.  HENT:     Head: Normocephalic and atraumatic.  Cardiovascular:     Rate and Rhythm: Normal rate and regular rhythm.     Heart sounds: Normal heart sounds. No murmur heard.    No friction rub. No gallop.  Pulmonary:     Effort: Pulmonary effort is normal. No tachypnea or respiratory distress.     Breath sounds: Normal breath sounds. No decreased breath sounds, wheezing, rhonchi or rales.  Chest:     Chest wall: No tenderness.  Musculoskeletal:        General: Normal range of motion.     Cervical back: Normal range of motion.  Skin:    General: Skin is warm and dry.  Neurological:     Mental Status: She is alert and oriented to person, place, and time.     Coordination: Coordination normal.  Psychiatric:        Behavior: Behavior normal. Behavior is cooperative.        Thought Content: Thought content normal.        Judgment: Judgment normal.          Patient has been counseled extensively about nutrition and exercise as well as the importance of adherence with medications and regular follow-up. The patient was given clear instructions to go to ER or return to medical center if symptoms don't improve, worsen or new problems develop. The patient verbalized understanding.   Follow-up: Return if symptoms worsen or fail to improve.   Edris Schneck W Gracynn Rajewski, FNP-BC [facility] [place], [place] [phone]   10/17/2024, 2:45 PM    [1]  Allergies Allergen Reactions   Nubain  [Nalbuphine  Hcl] Itching   "
